# Patient Record
Sex: Male | Born: 1960 | Race: White | Hispanic: No | Smoking: Never smoker
Health system: Southern US, Community
[De-identification: ages and names within clinical notes are randomized; demographics above are authoritative.]

## PROBLEM LIST (undated history)

## (undated) DIAGNOSIS — E669 Obesity, unspecified: Secondary | ICD-10-CM

## (undated) DIAGNOSIS — M5030 Other cervical disc degeneration, unspecified cervical region: Secondary | ICD-10-CM

## (undated) DIAGNOSIS — R7303 Prediabetes: Secondary | ICD-10-CM

## (undated) DIAGNOSIS — K219 Gastro-esophageal reflux disease without esophagitis: Secondary | ICD-10-CM

## (undated) DIAGNOSIS — G4730 Sleep apnea, unspecified: Secondary | ICD-10-CM

## (undated) DIAGNOSIS — M1990 Unspecified osteoarthritis, unspecified site: Secondary | ICD-10-CM

## (undated) DIAGNOSIS — I700 Atherosclerosis of aorta: Secondary | ICD-10-CM

## (undated) DIAGNOSIS — R7989 Other specified abnormal findings of blood chemistry: Secondary | ICD-10-CM

## (undated) DIAGNOSIS — M109 Gout, unspecified: Secondary | ICD-10-CM

## (undated) DIAGNOSIS — I10 Essential (primary) hypertension: Secondary | ICD-10-CM

## (undated) DIAGNOSIS — L570 Actinic keratosis: Secondary | ICD-10-CM

## (undated) DIAGNOSIS — E119 Type 2 diabetes mellitus without complications: Secondary | ICD-10-CM

## (undated) HISTORY — DX: Essential (primary) hypertension: I10

## (undated) HISTORY — DX: Actinic keratosis: L57.0

## (undated) HISTORY — PX: SPINE SURGERY: SHX786

## (undated) HISTORY — PX: BACK SURGERY: SHX140

## (undated) HISTORY — PX: PROSTATE SURGERY: SHX751

## (undated) HISTORY — PX: ELBOW SURGERY: SHX618

## (undated) HISTORY — DX: Obesity, unspecified: E66.9

---

## 1997-04-02 DIAGNOSIS — M7710 Lateral epicondylitis, unspecified elbow: Secondary | ICD-10-CM | POA: Insufficient documentation

## 2006-03-04 ENCOUNTER — Ambulatory Visit: Payer: Self-pay | Admitting: Specialist

## 2006-03-19 ENCOUNTER — Ambulatory Visit (HOSPITAL_COMMUNITY): Admission: RE | Admit: 2006-03-19 | Discharge: 2006-03-20 | Payer: Self-pay | Admitting: Neurosurgery

## 2006-11-27 ENCOUNTER — Ambulatory Visit: Payer: Self-pay | Admitting: Neurosurgery

## 2007-01-07 ENCOUNTER — Inpatient Hospital Stay (HOSPITAL_COMMUNITY): Admission: RE | Admit: 2007-01-07 | Discharge: 2007-01-09 | Payer: Self-pay | Admitting: Neurosurgery

## 2007-02-11 ENCOUNTER — Encounter: Admission: RE | Admit: 2007-02-11 | Discharge: 2007-02-11 | Payer: Self-pay | Admitting: Neurosurgery

## 2008-12-31 ENCOUNTER — Encounter: Admission: RE | Admit: 2008-12-31 | Discharge: 2008-12-31 | Payer: Self-pay | Admitting: Neurosurgery

## 2009-11-10 ENCOUNTER — Ambulatory Visit: Payer: Self-pay | Admitting: Gastroenterology

## 2010-06-06 NOTE — Op Note (Signed)
NAMEKLAUS, Mike Patel                 ACCOUNT NO.:  [id number]   MEDICAL RECORD NO.:  [id number]          PATIENT TYPE:  INP   LOCATION:  2899                         FACILITY:  [facility]   PHYSICIAN:  Sherilyn Cooter A. Pool, M.D.    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   PROCEDURE:  L4-5 redo laminectomy with resection of synovial cyst.  L4-5  posterior lumbar interbody fusion utilizing Tangent interbody allograft  wedge, total interbody PEEK cage, and local autografting.  L4-5  posterolateral arthrodesis utilizing nonsegmental pedicle screw fixation  and local autografting.   SURGEON:  Kathaleen Maser. Pool, M.D.   ASSISTANT:  Reinaldo Meeker, M.D.   ANESTHESIA:  General.   INDICATIONS FOR PROCEDURE:  Mike Patel is a 50 year old male who is status  post previous left-sided L4-5 laminotomy and microdiskectomy who  presents now with worsening back and left lower extremity pain.  Workup  demonstrates evidence of the new onset of enlarged left-sided L4-5  synovial cyst causing compression of the thecal sac and the left-sided  L5 nerve root.  There is evidence of dynamic instability at the L4-5  level.  The patient does have disk degeneration at L5-S1, but this is  not symptomatic at this time.  We discussed the options of management.  We decided to proceed with L4-5 decompression and fusion.   DESCRIPTION OF PROCEDURE:  The patient was brought to the operating room  and placed on the operating table in the supine position.  After  adequate anesthesia was achieved, the patient was turned prone onto the  Wilson frame appropriately padded.  The patient's lumbar region was  prepped and draped sterilely.   A 10 blade was used to make a linear skin  incision overlying the L4-5  interspace.  This was carried down sharply in the midline.  Subperiosteal dissection then performed, exposing the lamina and facet  joints of L4 and L5 as well as the transverse processes of L4 and L5.  Deep self-retaining retractors were placed. Intraoperative fluoroscopy  was used.  Levels were confirmed.  Previous laminotomy on the left-side  at L4-5 was dissected free using dental instruments.  Complete  laminectomy of L4 was then performed using Leksell rongeurs, Kerrison  rongeurs, and high-speed drill.  All elements of the lamina were  completely resected.  All elements of the inferior facets of L4 were  resected bilaterally.  Superior facetectomies at L5 were performed  bilaterally, and superior laminectomy at L5 was performed bilaterally.  All bone was cleaned and used in later autograft.  The ligament flavum  and epidural scar was then dissected free and resected.  Underlying  thecal sac and exiting L4 and L5 nerve roots were identified.  The  synovial cyst on the left side was gently dissected free using  microdissection and completely resected.  Epidural venous plexus  coagulated and cut.  Starting first on the patient's right side, the  thecal sac and nerve roots gently mobilized and tracked towards the  midline.  Disk space incised with 15 blade in rectangular fracture.  Wide disk space clean out was achieved using pituitary rongeurs,  upbiting pituitary rongeurs, and Epstein curettes.  All elements of the  disk herniation and central disk were removed.  Procedure was then  repeated on the contralateral side.  A 12-mm distractor was left in the  patient's right side.  The thecal sac and nerve roots were protected on  the left side.  Disk space was then reamed and cut with 12-mm Tangent  instrument.  Soft tissue was removed from the  interspace.  A 12 x 26-mm  Tangent wedge was then impacted into place and recessed approximately 3  mm into the  posterior cortical margin of L4.  Distractors were moved to  the patient's right side.  The thecal sac and nerve roots were protected  on the right side.  Disk space once again reamed and then cut with 12-mm  Tangent instrument.  Soft tissues removed from the interspace.  The disk  space was further curettaged.  Morselized autograft was then packed into  the interspace.  A 12 x 26-mm Telamon cage was then impacted into place  and recessed approximately 3 mm into the posterior cortical margin.  Pedicles at L4-L5 were then identified using surface landmarks and  intraoperative fluoroscopy.  Superficial bone around the pedicle was  then removed using the high-speed drill.  Each pedicle was then probed  using pedicle awl.  Pedicle awl track was then tapped with the 5.25  screw tapper.  Each screw tap hole was probed and found be solid within  bone.  6.75 x 50-mm Radius screws were placed bilaterally at L4, 6.75 x  45-mm screws placed bilaterally at L5.  Transverse processes of L4 and  L5 were then decorticated using high-speed drill.  Morselized autograft  was packed posterolaterally for later fusion.  Short segment titanium  rod was then placed over the screw heads at L4-L5.  Locking caps then  placed over the titanium rod.  Each locking cap was then given a final  tightening with the construct under compression.  Final images revealed  good position of bone grafts, hardware at proper level at lumbar spine.  The transverse connector was placed.  The wound was then irrigated one  final time.  Gelfoam was placed topically.  Hemostasis found to be good.  A medium Hemovac drain was placed in the epidural space.  The wound was  then closed in layers with Vicryl suture.  Steri-Strips and sterile  dressing were applied.  There were no complications.   The patient tolerated the procedure well and returned to the recovery  room postoperatively.           ______________________________  Kathaleen Maser  Pool, M.D.     HAP/MEDQ  D:  01/07/2007  T:  01/07/2007  Job:  [id number]

## 2010-06-09 NOTE — Op Note (Signed)
NAMEIKECHUKWU, CERNY                 ACCOUNT NO.:  [id number]   MEDICAL RECORD NO.:  [id number]          PATIENT TYPE:  AMB   LOCATION:  SDS                          FACILITY:  [facility]   PHYSICIAN:  Henry A. Pool, M.D.    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   PROCEDURE NOTE:  Left L4-5 laminotomy and microdiskectomy.   SURGEON:  Kathaleen Maser. Pool, M.D.   ASSISTANT:  Reinaldo Meeker, M.D.   ANESTHESIA:  General orotracheal anesthesia.   INDICATIONS FOR PROCEDURE:  Mr. Manning is a 50 year old male with history  of severe left lower extremity pain, paresthesias and weakness  consistent with a  left-sided L5 radiculopathy failing conservative  management.  Workup demonstrates evidence of left-sided L4-5 spondylosis  with an associated disk herniation causing compression of the left-sided  L5 nerve root.  The patient has been counseled as to his options.  He  has decided to proceed with a left-sided L4-5 laminotomy microdiskectomy  in hopes of improving his symptoms.   DESCRIPTION OF PROCEDURE:  The patient taken to the operating room and  placed on the table in supine position.  After adequate level of  anesthesia was achieved, the patient positioned prone onto Wilson frame,  appropriately padded.  Patient's lumbar region was prepped and draped  sterilely.  A 10 blade was used to make a linear skin incision overlying  the L4-5 interspace.  This was carried down sharply in the midline.  Subperiosteal dissection was performed exposing the lamina of facet  joints at L4 and L5 on the left side.  Deep self-retaining retractor was  placed.  Intraoperative x-ray was taken and levels were confirmed.  Laminotomy was then performed using  high-speed drill and Kerrison  rongeurs.  We removed the inferior aspect of the lamina of L4, medial  aspect of the L4-5 facet joint, superior rim of the L5 lamina.  Ligament  flavum was then elevated and resected in piecemeal fashion using  Kerrison rongeurs.  Underlying thecal sac and exiting L5 nerve root were  identified.  Microscope was brought into the field and used for  microdissection of left-sided L5 nerve root, underlying disk herniation.  Epidural venous plexus coagulated and cut.  Thecal sac and L5 nerve root  were gently mobilized towards the midline and held in place with the  D'Errico repair nerve root retractor.  Disk herniation was readily  apparent.  This was then incised with a 15 blade in a rectangular  fashion entering the annulus as well.  All elements of disk herniation  were completely resected.  All loose or obviously degenerative disc  material from within the interspace was also resected.  At this point, a  very thorough decompression had been achieved.  There was no injury to  the thecal sac or nerve roots.  Wounds were then irrigated with  antibiotic solution.  Gelfoam was placed topically for hemostasis which  was found to be good.  Microscope and retractor system were removed.  Hemostasis was achieved with electrothermal.  Wounds were closed in layers with Vicryl suture.  Steri-Strips and  sterile dressings were applied.  There were no apparent complications.  The patient tolerated the procedure well and he returns to the recovery  room postoperatively.           ______________________________  Kathaleen Maser Pool, M.D.     HAP/MEDQ  D:  03/19/2006  T:  03/19/2006  Job:  [id number]

## 2010-10-30 LAB — CBC
HCT: 47.1
Hemoglobin: 15.9
MCHC: 33.8
MCV: 88.6
Platelets: 356
RBC: 5.32
RDW: 13.1
WBC: 13.9 — ABNORMAL HIGH

## 2010-10-30 LAB — DIFFERENTIAL
Basophils Absolute: 0
Basophils Relative: 0
Eosinophils Absolute: 0 — ABNORMAL LOW
Eosinophils Relative: 0
Lymphocytes Relative: 15
Lymphs Abs: 2.1
Monocytes Absolute: 0.3
Monocytes Relative: 2 — ABNORMAL LOW
Neutro Abs: 11.5 — ABNORMAL HIGH
Neutrophils Relative %: 83 — ABNORMAL HIGH

## 2010-10-30 LAB — BASIC METABOLIC PANEL
BUN: 12
CO2: 28
Calcium: 9.8
Chloride: 100
Creatinine, Ser: 0.84
GFR calc Af Amer: >60
GFR calc non Af Amer: >60
Glucose, Bld: 123 — ABNORMAL HIGH
Potassium: 4.5
Sodium: 140

## 2010-10-30 LAB — TYPE AND SCREEN
ABO/RH(D): O POS
Antibody Screen: NEGATIVE

## 2011-07-22 ENCOUNTER — Ambulatory Visit (INDEPENDENT_AMBULATORY_CARE_PROVIDER_SITE_OTHER): Payer: BC Managed Care – PPO | Admitting: Physician Assistant

## 2011-07-22 VITALS — BP 146/94 | HR 97 | Temp 98.2°F | Resp 20 | Ht 70.0 in | Wt 350.2 lb

## 2011-07-22 DIAGNOSIS — J019 Acute sinusitis, unspecified: Secondary | ICD-10-CM

## 2011-07-22 MED ORDER — IPRATROPIUM BROMIDE 0.03 % NA SOLN: 2.0000 | Freq: Two times a day (BID) | NASAL | Status: DC

## 2011-07-22 MED ORDER — AMOXICILLIN 875 MG PO TABS: 1750.0000 mg | ORAL_TABLET | Freq: Two times a day (BID) | ORAL | Status: AC

## 2011-07-22 MED ORDER — GUAIFENESIN ER 1200 MG PO TB12: 1.0000 | ORAL_TABLET | Freq: Two times a day (BID) | ORAL | Status: DC | PRN

## 2011-07-22 NOTE — Progress Notes (Signed)
  Subjective:    Patient ID: Mike Patel, male    DOB: Sep 26, 1960, 51 y.o.   MRN: [id number]  HPI Illness x 3 days. Facial pressure, pain, "my teeth hurt," drainage.  Mucinex cold and sinus without benefit. Fever/chills yesterday, T 99.  No GI/GU symptoms.  Unable to work last night.  Review of Systems As above.    Objective:   Physical Exam  Vitals reviewed. Constitutional: He is oriented to person, place, and time. Vital signs are normal. He appears well-developed and well-nourished. No distress.  HENT:  Head: Normocephalic and atraumatic.  Right Ear: Hearing, tympanic membrane, external ear and ear canal normal.  Left Ear: Hearing, tympanic membrane, external ear and ear canal normal.  Nose: Mucosal edema and rhinorrhea present.  No foreign bodies. Right sinus exhibits maxillary sinus tenderness. Right sinus exhibits no frontal sinus tenderness. Left sinus exhibits maxillary sinus tenderness. Left sinus exhibits no frontal sinus tenderness.  Mouth/Throat: Uvula is midline, oropharynx is clear and moist and mucous membranes are normal. No uvula swelling. No oropharyngeal exudate.  Eyes: Conjunctivae and EOM are normal. Pupils are equal, round, and reactive to light. Right eye exhibits no discharge. Left eye exhibits no discharge. No scleral icterus.  Neck: Trachea normal, normal range of motion and full passive range of motion without pain. Neck supple. No mass and no thyromegaly present.  Cardiovascular: Normal rate, regular rhythm and normal heart sounds.   Pulmonary/Chest: Effort normal and breath sounds normal.  Lymphadenopathy:       Head (right side): No submandibular, no tonsillar, no preauricular, no posterior auricular and no occipital adenopathy present.       Head (left side): No submandibular, no tonsillar, no preauricular and no occipital adenopathy present.    He has no cervical adenopathy.       Right: No supraclavicular adenopathy present.       Left: No  supraclavicular adenopathy present.  Neurological: He is alert and oriented to person, place, and time. He has normal strength. No cranial nerve deficit or sensory deficit.  Skin: Skin is warm, dry and intact. No rash noted.  Psychiatric: He has a normal mood and affect. His speech is normal and behavior is normal.          Assessment & Plan:   1. Acute sinusitis, unspecified  ipratropium (ATROVENT) 0.03 % nasal spray, Guaifenesin (MUCINEX MAXIMUM STRENGTH) 1200 MG TB12, amoxicillin (AMOXIL) 875 MG tablet   Patient Instructions  Stop the Mucinex Cold and Sinus, as it contains a decongestant which can be drying, and can raise your blood pressure.  Instead, use the Mucinex Maximum Strength twice daily.  Be sure to drink at least 64 ounces of water daily, as the Mucinex can't work properly if you do not consume enough liquids.  Get plenty of rest.

## 2011-07-22 NOTE — Patient Instructions (Signed)
Stop the Mucinex Cold and Sinus, as it contains a decongestant which can be drying, and can raise your blood pressure.  Instead, use the Mucinex Maximum Strength twice daily.  Be sure to drink at least 64 ounces of water daily, as the Mucinex can't work properly if you do not consume enough liquids.  Get plenty of rest.

## 2012-01-05 ENCOUNTER — Other Ambulatory Visit: Payer: Self-pay | Admitting: Physician Assistant

## 2012-01-28 ENCOUNTER — Ambulatory Visit (INDEPENDENT_AMBULATORY_CARE_PROVIDER_SITE_OTHER): Payer: BC Managed Care – PPO | Admitting: Family Medicine

## 2012-01-28 VITALS — BP 150/101 | HR 82 | Temp 98.3°F | Resp 18 | Ht 70.0 in | Wt 360.0 lb

## 2012-01-28 DIAGNOSIS — R6884 Jaw pain: Secondary | ICD-10-CM

## 2012-01-28 DIAGNOSIS — E669 Obesity, unspecified: Secondary | ICD-10-CM

## 2012-01-28 DIAGNOSIS — I10 Essential (primary) hypertension: Secondary | ICD-10-CM

## 2012-01-28 MED ORDER — AMOXICILLIN 500 MG PO CAPS: 1000.0000 mg | ORAL_CAPSULE | Freq: Two times a day (BID) | ORAL | Status: DC

## 2012-01-28 NOTE — Progress Notes (Signed)
[facility] [address], [place] [address] [phone]  Date:  01/28/2012   Name:  Mike Patel   DOB:  1960/12/15   MRN:  [id number]  PCP:  No primary provider on file.    Chief Complaint: Sinusitis, Otalgia and Jaw Pain   History of Present Illness:  Mike Patel is a 52 y.o. very pleasant male patient who presents with the following:  He has noted tenderness in his left neck,  jaw and teeth, and left ear for the last 3 days.  It seemed a little worse yesterday.  He has tried some mucinex but it has not helped, also ear drops.    He is a [employer] and has been up since yesterday.   The pain seems worse at rest, not with activity  He does not have a cough, but does feel a bit achy.  No chills or fever.  No ST.   He denies any CP or SOB.  He has never had a stress test or other cardiac evaluation  There is no problem list on file for this patient.   Past Medical History  Diagnosis Date  . Hypertension   . Obesity     Past Surgical History  Procedure Date  . Spine surgery     l-spine x 2  . Elbow surgery     tennis elbow    History  Substance Use Topics  . Smoking status: Never Smoker   . Smokeless tobacco: Not on file  . Alcohol Use: No    No family history on file.  No Known Allergies  Medication list has been reviewed and updated.  Current Outpatient Prescriptions on File Prior to Visit  Medication Sig Dispense Refill  . amLODipine (NORVASC) 10 MG tablet Take 10 mg by mouth daily.      Marland Kitchen aspirin 81 MG tablet Take 81 mg by mouth daily.      . benazepril (LOTENSIN) 20 MG tablet Take 20 mg by mouth daily.      . fish oil-omega-3 fatty acids 1000 MG capsule Take 2 g by mouth daily.      Marland Kitchen ipratropium (ATROVENT) 0.03 % nasal spray PLACE 2 SPRAYS INTO THE NOSTRIL TWICE A DAY  30 mL  0  . lansoprazole (PREVACID) 15 MG capsule Take 15 mg by mouth daily.      . Guaifenesin (MUCINEX MAXIMUM STRENGTH) 1200 MG TB12 Take 1 tablet  (1,200 mg total) by mouth every 12 (twelve) hours as needed.  14 tablet  1    Review of Systems:  As per HPI- otherwise negative.   Physical Examination: Filed Vitals:   01/28/12 0800  BP: 150/101  Pulse: 82  Temp: 98.3 F (36.8 C)  Resp: 18   Filed Vitals:   01/28/12 0800  Height: 5\' 10"  (1.778 m)  Weight: 360 lb (163.295 kg)   Body mass index is 51.65 kg/(m^2). Ideal Body Weight: Weight in (lb) to have BMI = 25: 173.9   GEN: WDWN, NAD, Non-toxic, A & O x 3, morbid obesity HEENT: Atraumatic, Normocephalic. Neck supple. No masses, No LAD. Bilateral TM wnl, oropharynx normal.  PEERL,EOMI.  No tooth tenderness or sign of dental infection or abscess.  No apparent cervical LAD, but he does note mild tenderness with pressure over right anterior cervical chain.  No meningismus, no bruit Ears and Nose: No external deformity. CV: RRR, No M/G/R. No JVD. No thrill. No extra heart sounds. PULM: CTA B,  no wheezes, crackles, rhonchi. No retractions. No resp. distress. No accessory muscle use. EXTR: No c/c/e NEURO Normal gait.  PSYCH: Normally interactive. Conversant. Not depressed or anxious appearing.  Calm demeanor.   Discussed my concern that he has multiple risk factors for CAD/ MI including obesity and HTN. Left jaw and neck pain can be a sign of cardiac ischemia.  However, he refused to let me perform an EKG today.  He stated that he was only interested in getting antibiotics and would just leave rather than allow me to perform an EKG or any other evaluation today.  "I know it's not my heart, I'm not into that (having an EKG)- if you won't give me some medication I'll just leave."   Assessment and Plan: 1. HTN (hypertension)    2. Obesity    3. Jaw pain  amoxicillin (AMOXIL) 500 MG capsule   Mike Patel is here with left neck and jaw pain today which he feels certain is due to some sort of infection.  Refused to allow me to investigate possible cardiac ischemia with an EKG despite my  explanations and urging.  Prescribed amoxicillin for him as he requested.  Did encourage him to let someone know if he does not feel better, and to have a stress test in any case due to his multiple risk factors including morbid obesity, uncontrolled HTN, sedentary lifestyle and I suspect high cholesterol.   Abbe Amsterdam, MD

## 2012-01-28 NOTE — Patient Instructions (Addendum)
If you continue to have jaw pain please let us or your regular doctor know. You have multiple risk factors for heart disease and heart attack and should consider having a stress test to look for early signs of a cardiac blockage.

## 2012-02-18 DIAGNOSIS — N529 Male erectile dysfunction, unspecified: Secondary | ICD-10-CM | POA: Insufficient documentation

## 2012-02-18 DIAGNOSIS — E291 Testicular hypofunction: Secondary | ICD-10-CM | POA: Insufficient documentation

## 2012-02-18 DIAGNOSIS — N4340 Spermatocele of epididymis, unspecified: Secondary | ICD-10-CM | POA: Insufficient documentation

## 2012-02-18 DIAGNOSIS — N401 Enlarged prostate with lower urinary tract symptoms: Secondary | ICD-10-CM | POA: Insufficient documentation

## 2012-02-19 ENCOUNTER — Other Ambulatory Visit: Payer: Self-pay | Admitting: Physician Assistant

## 2013-01-22 HISTORY — PX: PROSTATE SURGERY: SHX751

## 2013-06-01 DIAGNOSIS — R339 Retention of urine, unspecified: Secondary | ICD-10-CM | POA: Insufficient documentation

## 2013-09-01 ENCOUNTER — Encounter: Payer: Self-pay | Admitting: Physician Assistant

## 2013-09-01 DIAGNOSIS — G4733 Obstructive sleep apnea (adult) (pediatric): Secondary | ICD-10-CM | POA: Insufficient documentation

## 2013-09-01 DIAGNOSIS — Z9989 Dependence on other enabling machines and devices: Secondary | ICD-10-CM

## 2013-09-21 DIAGNOSIS — R399 Unspecified symptoms and signs involving the genitourinary system: Secondary | ICD-10-CM

## 2013-09-21 DIAGNOSIS — N429 Disorder of prostate, unspecified: Secondary | ICD-10-CM | POA: Insufficient documentation

## 2013-09-29 DIAGNOSIS — N411 Chronic prostatitis: Secondary | ICD-10-CM | POA: Insufficient documentation

## 2014-05-10 ENCOUNTER — Ambulatory Visit
Admit: 2014-05-10 | Disposition: A | Discharge status: Home / Self Care | Payer: Self-pay | Attending: Adult Health | Admitting: Adult Health

## 2014-05-24 ENCOUNTER — Ambulatory Visit
Admission: RE | Admit: 2014-05-24 | Discharge: 2014-05-24 | Disposition: A | Discharge status: Home / Self Care | Payer: BLUE CROSS/BLUE SHIELD | Source: Ambulatory Visit | Attending: Gastroenterology | Admitting: Gastroenterology

## 2014-05-24 ENCOUNTER — Encounter
Admission: RE | Disposition: A | Discharge status: Home / Self Care | Payer: Self-pay | Source: Ambulatory Visit | Attending: Gastroenterology

## 2014-05-24 ENCOUNTER — Ambulatory Visit: Payer: BLUE CROSS/BLUE SHIELD | Admitting: Anesthesiology

## 2014-05-24 ENCOUNTER — Encounter: Payer: Self-pay | Admitting: *Deleted

## 2014-05-24 DIAGNOSIS — I159 Secondary hypertension, unspecified: Secondary | ICD-10-CM

## 2014-05-24 DIAGNOSIS — E669 Obesity, unspecified: Secondary | ICD-10-CM

## 2014-05-24 DIAGNOSIS — Z86010 Personal history of colonic polyps: Secondary | ICD-10-CM | POA: Insufficient documentation

## 2014-05-24 DIAGNOSIS — K219 Gastro-esophageal reflux disease without esophagitis: Secondary | ICD-10-CM | POA: Insufficient documentation

## 2014-05-24 DIAGNOSIS — R194 Change in bowel habit: Secondary | ICD-10-CM | POA: Diagnosis present

## 2014-05-24 DIAGNOSIS — R1013 Epigastric pain: Secondary | ICD-10-CM | POA: Diagnosis not present

## 2014-05-24 DIAGNOSIS — A088 Other specified intestinal infections: Secondary | ICD-10-CM | POA: Insufficient documentation

## 2014-05-24 DIAGNOSIS — Z79899 Other long term (current) drug therapy: Secondary | ICD-10-CM | POA: Diagnosis not present

## 2014-05-24 DIAGNOSIS — G4733 Obstructive sleep apnea (adult) (pediatric): Secondary | ICD-10-CM

## 2014-05-24 DIAGNOSIS — R110 Nausea: Secondary | ICD-10-CM | POA: Insufficient documentation

## 2014-05-24 DIAGNOSIS — Z9989 Dependence on other enabling machines and devices: Secondary | ICD-10-CM

## 2014-05-24 HISTORY — DX: Sleep apnea, unspecified: G47.30

## 2014-05-24 HISTORY — PX: COLONOSCOPY: SHX5424

## 2014-05-24 HISTORY — PX: ESOPHAGOGASTRODUODENOSCOPY: SHX5428

## 2014-05-24 SURGERY — COLONOSCOPY
Anesthesia: General

## 2014-05-24 MED ORDER — ONDANSETRON HCL 4 MG/2ML IJ SOLN
INTRAMUSCULAR | Status: DC | PRN
Start: 2014-05-24 — End: 2014-05-24
  Administered 2014-05-24: 4 mg via INTRAVENOUS

## 2014-05-24 MED ORDER — LIDOCAINE HCL (PF) 2 % IJ SOLN
INTRAMUSCULAR | Status: DC | PRN
  Administered 2014-05-24: 60 mg via INTRADERMAL

## 2014-05-24 MED ORDER — SODIUM CHLORIDE 0.9 % IV SOLN
INTRAVENOUS | Status: DC
  Administered 2014-05-24: 10:00:00 via INTRAVENOUS
  Administered 2014-05-24: 1000 mL via INTRAVENOUS

## 2014-05-24 MED ORDER — FENTANYL CITRATE (PF) 100 MCG/2ML IJ SOLN
INTRAMUSCULAR | Status: DC | PRN
  Administered 2014-05-24: 50 ug via INTRAVENOUS

## 2014-05-24 MED ORDER — GLYCOPYRROLATE 0.2 MG/ML IJ SOLN
INTRAMUSCULAR | Status: DC | PRN
Start: 2014-05-24 — End: 2014-05-24
  Administered 2014-05-24: 0.2 mg via INTRAVENOUS

## 2014-05-24 MED ORDER — PHENYLEPHRINE HCL 10 MG/ML IJ SOLN
INTRAMUSCULAR | Status: DC | PRN
  Administered 2014-05-24: 200 ug via INTRAVENOUS

## 2014-05-24 MED ORDER — MIDAZOLAM HCL 2 MG/2ML IJ SOLN
INTRAMUSCULAR | Status: DC | PRN
  Administered 2014-05-24: 1 mg via INTRAVENOUS

## 2014-05-24 MED ORDER — PROPOFOL INFUSION 10 MG/ML OPTIME
INTRAVENOUS | Status: DC | PRN
  Administered 2014-05-24: 150 ug/kg/min via INTRAVENOUS

## 2014-05-24 MED ORDER — PROPOFOL 10 MG/ML IV BOLUS
INTRAVENOUS | Status: DC | PRN
  Administered 2014-05-24: 20 mg via INTRAVENOUS
  Administered 2014-05-24: 30 mg via INTRAVENOUS

## 2014-05-24 NOTE — Anesthesia Postprocedure Evaluation (Signed)
  Anesthesia Post-op Note  Patient: Mike Patel  Procedure(s) Performed: Procedure(s): COLONOSCOPY (N/A) ESOPHAGOGASTRODUODENOSCOPY (EGD) (N/A)  Anesthesia type:General  Patient location: PACU  Post pain: Pain level controlled  Post assessment: Post-op Vital signs reviewed, Patient's Cardiovascular Status Stable, Respiratory Function Stable, Patent Airway and No signs of Nausea or vomiting  Post vital signs: Reviewed and stable  Last Vitals:  Filed Vitals:   05/24/14 1135  BP: 113/61  Pulse: 72  Temp:   Resp: 15    Level of consciousness: awake, alert  and patient cooperative  Complications: No apparent anesthesia complications

## 2014-05-24 NOTE — Transfer of Care (Signed)
Immediate Anesthesia Transfer of Care Note  Patient: Mike Patel  Procedure(s) Performed: Procedure(s): COLONOSCOPY (N/A) ESOPHAGOGASTRODUODENOSCOPY (EGD) (N/A)  Patient Location: PACU  Anesthesia Type:MAC  Level of Consciousness: awake  Airway & Oxygen Therapy: Patient Spontanous Breathing  Post-op Assessment: Report given to RN  Post vital signs: stable  Last Vitals:  Filed Vitals:   05/24/14 1001  BP: 122/76  Pulse: 74  Temp: 37.1 C  Resp: 19    Complications: No apparent anesthesia complications

## 2014-05-24 NOTE — Anesthesia Preprocedure Evaluation (Addendum)
Anesthesia Evaluation  Patient identified by MRN, date of birth, ID band Patient awake    Reviewed: Allergy & Precautions, H&P , NPO status , Patient's Chart, lab work & pertinent test results  History of Anesthesia Complications Negative for: history of anesthetic complications  Airway Mallampati: III  TM Distance: >3 FB Neck ROM: full    Dental   Pulmonary sleep apnea and Continuous Positive Airway Pressure Ventilation ,          Cardiovascular hypertension, On Medications and On Home Beta Blockers     Neuro/Psych    GI/Hepatic GERD-  Medicated and Controlled,  Endo/Other    Renal/GU      Musculoskeletal   Abdominal   Peds  Hematology   Anesthesia Other Findings   Reproductive/Obstetrics                            Anesthesia Physical Anesthesia Plan  ASA: III  Anesthesia Plan: General   Post-op Pain Management:    Induction:   Airway Management Planned:   Additional Equipment:   Intra-op Plan:   Post-operative Plan:   Informed Consent: I have reviewed the patients History and Physical, chart, labs and discussed the procedure including the risks, benefits and alternatives for the proposed anesthesia with the patient or authorized representative who has indicated his/her understanding and acceptance.     Plan Discussed with: CRNA  Anesthesia Plan Comments:         Anesthesia Quick Evaluation

## 2014-05-24 NOTE — Op Note (Signed)
[facility] Gastroenterology Patient Name: Mike Patel Procedure Date: 05/24/2014 10:10 AM MRN: [id number] Account #: [id number] Date of Birth: 1960-07-05 Admit Type: Outpatient Age: 54 Room: [facility] ENDO ROOM 4 Gender: Male Note Status: Finalized Procedure:         Upper GI endoscopy Indications:       Suspected esophageal reflux, Nausea,                    Symptoms almost resolved. Providers:         Lupita Dawn. Candace Cruise, MD Referring MD:      Margaretha Glassing, MD (Referring MD) Medicines:         Monitored Anesthesia Care Complications:     No immediate complications. Procedure:         Pre-Anesthesia Assessment:                    - Prior to the procedure, a History and Physical was                     performed, and patient medications, allergies and                     sensitivities were reviewed. The patient's tolerance of                     previous anesthesia was reviewed.                    - The risks and benefits of the procedure and the sedation                     options and risks were discussed with the patient. All                     questions were answered and informed consent was obtained.                    - After reviewing the risks and benefits, the patient was                     deemed in satisfactory condition to undergo the procedure.                    After obtaining informed consent, the endoscope was passed                     under direct vision. Throughout the procedure, the                     patient's blood pressure, pulse, and oxygen saturations                     were monitored continuously. The Endoscope was introduced                     through the mouth, and advanced to the second part of                     duodenum. The upper GI endoscopy was accomplished without                     difficulty. The patient tolerated the procedure well. Findings:      The examined esophagus was normal. Biopsies were taken with a cold  forceps for histology.      The entire examined stomach was normal.      The examined duodenum was normal. Impression:        - Normal esophagus. Biopsied.                    - Normal stomach.                    - Normal examined duodenum. Recommendation:    - Discharge patient to home.                    - Observe patient's clinical course.                    - Continue present medications.                    - Await pathology results.                    - The findings and recommendations were discussed with the                     patient. Procedure Code(s): --- Professional ---                    [phone], Esophagogastroduodenoscopy, flexible, transoral;                     with biopsy, single or multiple Diagnosis Code(s): --- Professional ---                    R11.0, Nausea CPT copyright 2014 [employer]. All rights reserved. The codes documented in this report are preliminary and upon coder review may  be revised to meet current compliance requirements. Hulen Luster, MD 05/24/2014 10:42:57 AM This report has been signed electronically. Number of Addenda: 0 Note Initiated On: 05/24/2014 10:10 AM      [facility]

## 2014-05-24 NOTE — H&P (Signed)
  Date of Initial H&P: 05/18/2014  History reviewed, patient examined, no change in status, stable for surgery.

## 2014-05-24 NOTE — Op Note (Signed)
[facility] Gastroenterology Patient Name: Mike Patel Procedure Date: 05/24/2014 10:09 AM MRN: [id number] Account #: [id number] Date of Birth: [birth year] Admit Type: Outpatient Age: 54 Room: [facility] ENDO ROOM 4 Gender: Male Note Status: Finalized Procedure:         Colonoscopy Indications:       Personal history of colonic polyps, Change in bowel habits Providers:         Lupita Dawn. Candace Cruise, MD Referring MD:      Irven Easterly. Kary Kos, MD (Referring MD) Medicines:         Monitored Anesthesia Care Complications:     No immediate complications. Procedure:         Pre-Anesthesia Assessment:                    - Prior to the procedure, a History and Physical was                     performed, and patient medications, allergies and                     sensitivities were reviewed. The patient's tolerance of                     previous anesthesia was reviewed.                    - The risks and benefits of the procedure and the sedation                     options and risks were discussed with the patient. All                     questions were answered and informed consent was obtained.                    - After reviewing the risks and benefits, the patient was                     deemed in satisfactory condition to undergo the procedure.                    After obtaining informed consent, the colonoscope was                     passed under direct vision. Throughout the procedure, the                     patient's blood pressure, pulse, and oxygen saturations                     were monitored continuously. The Olympus CF-H180AL                     colonoscope ( S#: [id number] ) was introduced through the                     anus and advanced to the the cecum, identified by                     appendiceal orifice and ileocecal valve. The colonoscopy                     was performed without difficulty. The patient tolerated  the procedure well. The quality of the  bowel preparation                     was good. Findings:      The colon (entire examined portion) appeared normal. Biopsies for       histology were taken with a cold forceps from the entire colon for       evaluation of microscopic colitis. Impression:        - The entire examined colon is normal. Biopsied. Recommendation:    - Discharge patient to home.                    - Await pathology results.                    - Repeat colonoscopy in 5 years for surveillance.                    - The findings and recommendations were discussed with the                     patient.                    - Continue present medications. Procedure Code(s): --- Professional ---                    [phone], Colonoscopy, flexible; with biopsy, single or                     multiple Diagnosis Code(s): --- Professional ---                    Z86.010, Personal history of colonic polyps                    R19.4, Change in bowel habit CPT copyright 2014 American Medical Association. All rights reserved. The codes documented in this report are preliminary and upon coder review may  be revised to meet current compliance requirements. Hulen Luster, MD 05/24/2014 10:53:25 AM This report has been signed electronically. Number of Addenda: 0 Note Initiated On: 05/24/2014 10:09 AM Scope Withdrawal Time: 0 hours 4 minutes 3 seconds  Total Procedure Duration: 0 hours 6 minutes 20 seconds       [facility]

## 2014-05-27 ENCOUNTER — Encounter: Payer: Self-pay | Admitting: Gastroenterology

## 2014-05-27 LAB — SURGICAL PATHOLOGY

## 2014-07-28 ENCOUNTER — Other Ambulatory Visit: Payer: Self-pay | Admitting: Neurosurgery

## 2014-07-28 DIAGNOSIS — M5136 Other intervertebral disc degeneration, lumbar region: Secondary | ICD-10-CM

## 2014-08-03 ENCOUNTER — Ambulatory Visit: Admission: RE | Admit: 2014-08-03 | Payer: BLUE CROSS/BLUE SHIELD | Source: Ambulatory Visit

## 2014-08-05 ENCOUNTER — Ambulatory Visit: Payer: BLUE CROSS/BLUE SHIELD

## 2014-08-05 ENCOUNTER — Ambulatory Visit
Admission: RE | Admit: 2014-08-05 | Discharge: 2014-08-05 | Disposition: A | Discharge status: Home / Self Care | Payer: BLUE CROSS/BLUE SHIELD | Source: Ambulatory Visit | Attending: Neurosurgery | Admitting: Neurosurgery

## 2014-08-05 ENCOUNTER — Other Ambulatory Visit
Admission: RE | Admit: 2014-08-05 | Discharge: 2014-08-05 | Disposition: A | Discharge status: Home / Self Care | Payer: BLUE CROSS/BLUE SHIELD | Source: Ambulatory Visit | Attending: Neurosurgery | Admitting: Neurosurgery

## 2014-08-05 DIAGNOSIS — M5136 Other intervertebral disc degeneration, lumbar region: Secondary | ICD-10-CM | POA: Diagnosis not present

## 2014-08-05 DIAGNOSIS — M4806 Spinal stenosis, lumbar region: Secondary | ICD-10-CM | POA: Insufficient documentation

## 2014-08-05 DIAGNOSIS — M4807 Spinal stenosis, lumbosacral region: Secondary | ICD-10-CM | POA: Diagnosis not present

## 2014-08-05 LAB — CREATININE, SERUM
Creatinine, Ser: 0.82 mg/dL (ref 0.61–1.24)
GFR calc non Af Amer: >60 mL/min (ref >60)

## 2014-08-05 LAB — BUN: BUN: 14 mg/dL (ref 6–20)

## 2014-08-05 MED ORDER — GADOBENATE DIMEGLUMINE 529 MG/ML IV SOLN
20.0000 mL | Freq: Once | INTRAVENOUS | Status: AC | PRN
  Administered 2014-08-05: 20 mL via INTRAVENOUS

## 2014-08-18 DIAGNOSIS — Z79899 Other long term (current) drug therapy: Secondary | ICD-10-CM | POA: Insufficient documentation

## 2016-01-23 HISTORY — PX: BACK SURGERY: SHX140

## 2016-03-27 ENCOUNTER — Other Ambulatory Visit: Payer: Self-pay | Admitting: Neurosurgery

## 2016-03-27 DIAGNOSIS — M48062 Spinal stenosis, lumbar region with neurogenic claudication: Secondary | ICD-10-CM

## 2016-04-07 ENCOUNTER — Ambulatory Visit
Admission: RE | Admit: 2016-04-07 | Discharge: 2016-04-07 | Disposition: A | Discharge status: Home / Self Care | Payer: BLUE CROSS/BLUE SHIELD | Source: Ambulatory Visit | Attending: Neurosurgery | Admitting: Neurosurgery

## 2016-04-07 DIAGNOSIS — M48062 Spinal stenosis, lumbar region with neurogenic claudication: Secondary | ICD-10-CM

## 2016-04-07 MED ORDER — GADOBENATE DIMEGLUMINE 529 MG/ML IV SOLN
20.0000 mL | Freq: Once | INTRAVENOUS | Status: AC | PRN
  Administered 2016-04-07: 20 mL via INTRAVENOUS

## 2016-04-12 ENCOUNTER — Other Ambulatory Visit: Payer: Self-pay | Admitting: Neurosurgery

## 2016-04-27 NOTE — Pre-Procedure Instructions (Addendum)
Mike Patel  04/27/2016      [id number] - [place], [place] - [address] [place] [address] Phone: [phone] Fax: [phone]    Your procedure is scheduled on 05/07/16  Report to [facility] Admitting at 600 A.M.  Call this number if you have problems the morning of surgery:  [phone]   Remember:  Do not eat food or drink liquids after midnight.  Take these medicines the morning of surgery with A SIP OF WATER      Amlodipine(norvasc),lansoprazole(prevacid), metoprolol(toprol)  STOP all herbel meds, nsaids (aleve,naproxen,advil,ibuprofen) 7 days prior to surgery starting today  Including all vitamins/supplements,aspirin   Do not wear jewelry, make-up or nail polish.  Do not wear lotions, powders, or perfumes, or deoderant.  Do not shave 48 hours prior to surgery.  Men may shave face and neck.  Do not bring valuables to the hospital.  [facility] is not responsible for any belongings or valuables.  Contacts, dentures or bridgework may not be worn into surgery.  Leave your suitcase in the car.  After surgery it may be brought to your room.  For patients admitted to the hospital, discharge time will be determined by your treatment team.  Patients discharged the day of surgery will not be allowed to drive home.   Special instructions:   Special Instructions:  - Preparing for Surgery  Before surgery, you can play an important role.  Because skin is not sterile, your skin needs to be as free of germs as possible.  You can reduce the number of germs on you skin by washing with CHG (chlorahexidine gluconate) soap before surgery.  CHG is an antiseptic cleaner which kills germs and bonds with the skin to continue killing germs even after washing.  Please DO NOT use if you have an allergy to CHG or antibacterial soaps.  If your skin becomes reddened/irritated stop using the CHG and inform your nurse when you arrive at Short Stay.  Do not  shave (including legs and underarms) for at least 48 hours prior to the first CHG shower.  You may shave your face.  Please follow these instructions carefully:   1.  Shower with CHG Soap the night before surgery and the morning of Surgery.  2.  If you choose to wash your hair, wash your hair first as usual with your normal shampoo.  3.  After you shampoo, rinse your hair and body thoroughly to remove the Shampoo.  4.  Use CHG as you would any other liquid soap.  You can apply chg directly  to the skin and wash gently with scrungie or a clean washcloth.  5.  Apply the CHG Soap to your body ONLY FROM THE NECK DOWN.  Do not use on open wounds or open sores.  Avoid contact with your eyes ears, mouth and genitals (private parts).  Wash genitals (private parts)       with your normal soap.  6.  Wash thoroughly, paying special attention to the area where your surgery will be performed.  7.  Thoroughly rinse your body with warm water from the neck down.  8.  DO NOT shower/wash with your normal soap after using and rinsing off the CHG Soap.  9.  Pat yourself dry with a clean towel.            10.  Wear clean pajamas.  11.  Place clean sheets on your bed the night of your first shower and do not sleep with pets.  Day of Surgery  Do not apply any lotions/deodorants the morning of surgery.  Please wear clean clothes to the hospital/surgery center.  Please read over the  fact sheets that you were given.

## 2016-04-30 ENCOUNTER — Encounter (HOSPITAL_COMMUNITY): Payer: Self-pay

## 2016-04-30 ENCOUNTER — Encounter (HOSPITAL_COMMUNITY)
Admission: RE | Admit: 2016-04-30 | Discharge: 2016-04-30 | Disposition: A | Discharge status: Home / Self Care | Payer: BLUE CROSS/BLUE SHIELD | Source: Ambulatory Visit | Attending: Neurosurgery | Admitting: Neurosurgery

## 2016-04-30 DIAGNOSIS — Z0183 Encounter for blood typing: Secondary | ICD-10-CM | POA: Insufficient documentation

## 2016-04-30 DIAGNOSIS — Z01812 Encounter for preprocedural laboratory examination: Secondary | ICD-10-CM | POA: Insufficient documentation

## 2016-04-30 DIAGNOSIS — M48061 Spinal stenosis, lumbar region without neurogenic claudication: Secondary | ICD-10-CM | POA: Diagnosis not present

## 2016-04-30 DIAGNOSIS — Z01818 Encounter for other preprocedural examination: Secondary | ICD-10-CM | POA: Diagnosis present

## 2016-04-30 HISTORY — DX: Gastro-esophageal reflux disease without esophagitis: K21.9

## 2016-04-30 HISTORY — DX: Unspecified osteoarthritis, unspecified site: M19.90

## 2016-04-30 LAB — BASIC METABOLIC PANEL
Anion gap: 8 (ref 5–15)
BUN: 12 mg/dL (ref 6–20)
CALCIUM: 9.2 mg/dL (ref 8.9–10.3)
CO2: 25 mmol/L (ref 22–32)
CREATININE: 0.92 mg/dL (ref 0.61–1.24)
Chloride: 104 mmol/L (ref 101–111)
GFR calc Af Amer: >60 mL/min (ref >60)
GLUCOSE: 99 mg/dL (ref 65–99)
Potassium: 4.8 mmol/L (ref 3.5–5.1)
Sodium: 137 mmol/L (ref 135–145)

## 2016-04-30 LAB — CBC WITH DIFFERENTIAL/PLATELET
Basophils Absolute: 0 10*3/uL (ref 0.0–0.1)
Basophils Relative: 1 %
EOS ABS: 0.1 10*3/uL (ref 0.0–0.7)
EOS PCT: 2 %
HCT: 51.3 % (ref 39.0–52.0)
HEMOGLOBIN: 17 g/dL (ref 13.0–17.0)
LYMPHS ABS: 2.6 10*3/uL (ref 0.7–4.0)
Lymphocytes Relative: 33 %
MCH: 31 pg (ref 26.0–34.0)
MCHC: 33.1 g/dL (ref 30.0–36.0)
MCV: 93.4 fL (ref 78.0–100.0)
MONOS PCT: 11 %
Monocytes Absolute: 0.9 10*3/uL (ref 0.1–1.0)
Neutro Abs: 4.2 10*3/uL (ref 1.7–7.7)
Neutrophils Relative %: 53 %
PLATELETS: 229 10*3/uL (ref 150–400)
RBC: 5.49 MIL/uL (ref 4.22–5.81)
RDW: 13 % (ref 11.5–15.5)
WBC: 7.9 10*3/uL (ref 4.0–10.5)

## 2016-04-30 LAB — TYPE AND SCREEN
ABO/RH(D): O POS
Antibody Screen: NEGATIVE

## 2016-04-30 LAB — SURGICAL PCR SCREEN
MRSA, PCR: NEGATIVE
Staphylococcus aureus: POSITIVE — AB

## 2016-05-04 MED ORDER — DEXAMETHASONE SODIUM PHOSPHATE 10 MG/ML IJ SOLN
10.0000 mg | INTRAMUSCULAR | Status: AC
  Administered 2016-05-07: 10 mg via INTRAVENOUS
  Filled 2016-05-04: qty 1

## 2016-05-04 MED ORDER — DEXTROSE 5 % IV SOLN
3.0000 g | INTRAVENOUS | Status: AC
  Administered 2016-05-07: 3 g via INTRAVENOUS
  Filled 2016-05-04: qty 3000

## 2016-05-06 NOTE — Anesthesia Preprocedure Evaluation (Addendum)
Anesthesia Evaluation  Patient identified by MRN, date of birth, ID band Patient awake    Reviewed: Allergy & Precautions, H&P , NPO status , Patient's Chart, lab work & pertinent test results  Airway Mallampati: II  TM Distance: >3 FB Neck ROM: Full    Dental no notable dental hx. (+) Teeth Intact, Dental Advisory Given   Pulmonary sleep apnea and Continuous Positive Airway Pressure Ventilation ,    Pulmonary exam normal breath sounds clear to auscultation       Cardiovascular Exercise Tolerance: Good hypertension, Pt. on medications and Pt. on home beta blockers  Rhythm:Regular Rate:Normal     Neuro/Psych negative neurological ROS  negative psych ROS   GI/Hepatic Neg liver ROS, GERD  Medicated and Controlled,  Endo/Other  Morbid obesity  Renal/GU negative Renal ROS  negative genitourinary   Musculoskeletal  (+) Arthritis , Osteoarthritis,    Abdominal   Peds  Hematology negative hematology ROS (+)   Anesthesia Other Findings   Reproductive/Obstetrics negative OB ROS                            Anesthesia Physical Anesthesia Plan  ASA: III  Anesthesia Plan: General   Post-op Pain Management:    Induction: Intravenous  Airway Management Planned: Oral ETT  Additional Equipment:   Intra-op Plan:   Post-operative Plan: Extubation in OR  Informed Consent: I have reviewed the patients History and Physical, chart, labs and discussed the procedure including the risks, benefits and alternatives for the proposed anesthesia with the patient or authorized representative who has indicated his/her understanding and acceptance.   Dental advisory given  Plan Discussed with: CRNA, Anesthesiologist and Surgeon  Anesthesia Plan Comments:        Anesthesia Quick Evaluation

## 2016-05-07 ENCOUNTER — Inpatient Hospital Stay (HOSPITAL_COMMUNITY)
Admission: RE | Admit: 2016-05-07 | Discharge: 2016-05-08 | DRG: 460 | Disposition: A | Discharge status: Home / Self Care | Payer: BLUE CROSS/BLUE SHIELD | Source: Ambulatory Visit | Attending: Neurosurgery | Admitting: Neurosurgery

## 2016-05-07 ENCOUNTER — Inpatient Hospital Stay (HOSPITAL_COMMUNITY): Payer: BLUE CROSS/BLUE SHIELD

## 2016-05-07 ENCOUNTER — Inpatient Hospital Stay (HOSPITAL_COMMUNITY)
Admission: RE | Disposition: A | Discharge status: Home / Self Care | Payer: Self-pay | Source: Ambulatory Visit | Attending: Neurosurgery

## 2016-05-07 ENCOUNTER — Encounter (HOSPITAL_COMMUNITY): Payer: Self-pay | Admitting: Anesthesiology

## 2016-05-07 ENCOUNTER — Inpatient Hospital Stay (HOSPITAL_COMMUNITY): Payer: BLUE CROSS/BLUE SHIELD | Admitting: Emergency Medicine

## 2016-05-07 ENCOUNTER — Inpatient Hospital Stay (HOSPITAL_COMMUNITY): Payer: BLUE CROSS/BLUE SHIELD | Admitting: Anesthesiology

## 2016-05-07 DIAGNOSIS — G4730 Sleep apnea, unspecified: Secondary | ICD-10-CM | POA: Diagnosis present

## 2016-05-07 DIAGNOSIS — M4696 Unspecified inflammatory spondylopathy, lumbar region: Secondary | ICD-10-CM | POA: Diagnosis present

## 2016-05-07 DIAGNOSIS — M48062 Spinal stenosis, lumbar region with neurogenic claudication: Secondary | ICD-10-CM | POA: Diagnosis present

## 2016-05-07 DIAGNOSIS — M5136 Other intervertebral disc degeneration, lumbar region: Secondary | ICD-10-CM | POA: Diagnosis present

## 2016-05-07 DIAGNOSIS — I10 Essential (primary) hypertension: Secondary | ICD-10-CM | POA: Diagnosis present

## 2016-05-07 DIAGNOSIS — Z981 Arthrodesis status: Secondary | ICD-10-CM | POA: Diagnosis not present

## 2016-05-07 DIAGNOSIS — K219 Gastro-esophageal reflux disease without esophagitis: Secondary | ICD-10-CM | POA: Diagnosis present

## 2016-05-07 DIAGNOSIS — Z6841 Body Mass Index (BMI) 40.0 and over, adult: Secondary | ICD-10-CM

## 2016-05-07 DIAGNOSIS — Z419 Encounter for procedure for purposes other than remedying health state, unspecified: Secondary | ICD-10-CM

## 2016-05-07 SURGERY — POSTERIOR LUMBAR FUSION 2 LEVEL
Anesthesia: General | Site: Back

## 2016-05-07 MED ORDER — PANTOPRAZOLE SODIUM 40 MG PO TBEC
40.0000 mg | DELAYED_RELEASE_TABLET | Freq: Every day | ORAL | Status: DC
  Administered 2016-05-08: 40 mg via ORAL
  Filled 2016-05-07: qty 1

## 2016-05-07 MED ORDER — THROMBIN 20000 UNITS EX SOLR
CUTANEOUS | Status: AC
  Filled 2016-05-07: qty 20000

## 2016-05-07 MED ORDER — SODIUM CHLORIDE 0.9% FLUSH: 3.0000 mL | INTRAVENOUS | Status: DC | PRN

## 2016-05-07 MED ORDER — METOPROLOL SUCCINATE ER 25 MG PO TB24
50.0000 mg | ORAL_TABLET | Freq: Every day | ORAL | Status: DC
Start: 2016-05-08 — End: 2016-05-08
  Administered 2016-05-08: 50 mg via ORAL
  Filled 2016-05-07: qty 2

## 2016-05-07 MED ORDER — LIDOCAINE 2% (20 MG/ML) 5 ML SYRINGE
INTRAMUSCULAR | Status: AC
  Filled 2016-05-07: qty 5

## 2016-05-07 MED ORDER — DIPHENHYDRAMINE HCL 50 MG/ML IJ SOLN
INTRAMUSCULAR | Status: AC
  Filled 2016-05-07: qty 1

## 2016-05-07 MED ORDER — VANCOMYCIN HCL 1000 MG IV SOLR
INTRAVENOUS | Status: DC | PRN
Start: 2016-05-07 — End: 2016-05-07
  Administered 2016-05-07: 1000 mg

## 2016-05-07 MED ORDER — VANCOMYCIN HCL 1000 MG IV SOLR
INTRAVENOUS | Status: AC
  Filled 2016-05-07: qty 1000

## 2016-05-07 MED ORDER — SODIUM CHLORIDE 0.9 % IJ SOLN
INTRAMUSCULAR | Status: AC
  Filled 2016-05-07: qty 10

## 2016-05-07 MED ORDER — SODIUM CHLORIDE 0.9 % IV SOLN
INTRAVENOUS | Status: DC | PRN
  Administered 2016-05-07: 12:00:00 via INTRAVENOUS

## 2016-05-07 MED ORDER — HYDROMORPHONE HCL 1 MG/ML IJ SOLN
INTRAMUSCULAR | Status: AC
  Administered 2016-05-07: 0.5 mg via INTRAVENOUS
  Filled 2016-05-07: qty 1

## 2016-05-07 MED ORDER — AMLODIPINE BESYLATE 10 MG PO TABS
10.0000 mg | ORAL_TABLET | Freq: Every day | ORAL | Status: DC
  Administered 2016-05-08: 10 mg via ORAL
  Filled 2016-05-07: qty 1

## 2016-05-07 MED ORDER — TESTOSTERONE CYPIONATE 100 MG/ML IM SOLN
100.0000 mg | INTRAMUSCULAR | Status: DC
  Filled 2016-05-07: qty 1

## 2016-05-07 MED ORDER — VECURONIUM BROMIDE 10 MG IV SOLR
INTRAVENOUS | Status: DC | PRN
  Administered 2016-05-07: 2 mg via INTRAVENOUS

## 2016-05-07 MED ORDER — ALBUMIN HUMAN 5 % IV SOLN
INTRAVENOUS | Status: DC | PRN
  Administered 2016-05-07: 10:00:00 via INTRAVENOUS

## 2016-05-07 MED ORDER — SUCCINYLCHOLINE CHLORIDE 200 MG/10ML IV SOSY
PREFILLED_SYRINGE | INTRAVENOUS | Status: AC
  Filled 2016-05-07: qty 10

## 2016-05-07 MED ORDER — EPHEDRINE 5 MG/ML INJ
INTRAVENOUS | Status: AC
  Filled 2016-05-07: qty 10

## 2016-05-07 MED ORDER — ONDANSETRON HCL 4 MG/2ML IJ SOLN
4.0000 mg | Freq: Four times a day (QID) | INTRAMUSCULAR | Status: DC | PRN
  Administered 2016-05-07 (×2): 4 mg via INTRAVENOUS
  Filled 2016-05-07 (×2): qty 2

## 2016-05-07 MED ORDER — SODIUM CHLORIDE 0.9 % IV SOLN: 250.0000 mL | INTRAVENOUS | Status: DC

## 2016-05-07 MED ORDER — VECURONIUM BROMIDE 10 MG IV SOLR
INTRAVENOUS | Status: AC
  Filled 2016-05-07: qty 10

## 2016-05-07 MED ORDER — CHLORHEXIDINE GLUCONATE CLOTH 2 % EX PADS: 6.0000 | MEDICATED_PAD | Freq: Once | CUTANEOUS | Status: DC

## 2016-05-07 MED ORDER — MIDAZOLAM HCL 5 MG/5ML IJ SOLN
INTRAMUSCULAR | Status: DC | PRN
  Administered 2016-05-07: 2 mg via INTRAVENOUS

## 2016-05-07 MED ORDER — PROPOFOL 10 MG/ML IV BOLUS
INTRAVENOUS | Status: DC | PRN
  Administered 2016-05-07: 200 mg via INTRAVENOUS

## 2016-05-07 MED ORDER — PROPOFOL 10 MG/ML IV BOLUS
INTRAVENOUS | Status: AC
  Filled 2016-05-07: qty 20

## 2016-05-07 MED ORDER — HYDROMORPHONE HCL 1 MG/ML IJ SOLN
0.5000 mg | INTRAMUSCULAR | Status: DC | PRN
  Administered 2016-05-07: 1 mg via INTRAVENOUS
  Filled 2016-05-07: qty 1

## 2016-05-07 MED ORDER — THROMBIN 20000 UNITS EX SOLR
CUTANEOUS | Status: DC | PRN
  Administered 2016-05-07: 20 mL via TOPICAL

## 2016-05-07 MED ORDER — HYDROMORPHONE HCL 1 MG/ML IJ SOLN
0.5000 mg | INTRAMUSCULAR | Status: AC | PRN
  Administered 2016-05-07 (×2): 0.5 mg via INTRAVENOUS

## 2016-05-07 MED ORDER — PHENYLEPHRINE HCL 10 MG/ML IJ SOLN
INTRAMUSCULAR | Status: DC | PRN
  Administered 2016-05-07: 50 ug/min via INTRAVENOUS

## 2016-05-07 MED ORDER — SODIUM CHLORIDE 0.9% FLUSH
3.0000 mL | Freq: Two times a day (BID) | INTRAVENOUS | Status: DC
  Administered 2016-05-07: 3 mL via INTRAVENOUS

## 2016-05-07 MED ORDER — SODIUM CHLORIDE 0.9 % IR SOLN
Status: DC | PRN
  Administered 2016-05-07: 500 mL

## 2016-05-07 MED ORDER — ARTIFICIAL TEARS OP OINT
TOPICAL_OINTMENT | OPHTHALMIC | Status: DC | PRN
  Administered 2016-05-07: 1 via OPHTHALMIC

## 2016-05-07 MED ORDER — LACTATED RINGERS IV SOLN
INTRAVENOUS | Status: DC | PRN
  Administered 2016-05-07 (×3): via INTRAVENOUS

## 2016-05-07 MED ORDER — PHENYLEPHRINE HCL 10 MG/ML IJ SOLN
INTRAMUSCULAR | Status: DC | PRN
  Administered 2016-05-07: 120 ug via INTRAVENOUS
  Administered 2016-05-07 (×2): 80 ug via INTRAVENOUS

## 2016-05-07 MED ORDER — DIAZEPAM 5 MG PO TABS
5.0000 mg | ORAL_TABLET | Freq: Four times a day (QID) | ORAL | Status: DC | PRN
  Administered 2016-05-07: 10 mg via ORAL
  Filled 2016-05-07: qty 2

## 2016-05-07 MED ORDER — ONDANSETRON HCL 4 MG/2ML IJ SOLN
INTRAMUSCULAR | Status: DC | PRN
  Administered 2016-05-07: 4 mg via INTRAVENOUS

## 2016-05-07 MED ORDER — ONDANSETRON HCL 4 MG PO TABS: 4.0000 mg | ORAL_TABLET | Freq: Four times a day (QID) | ORAL | Status: DC | PRN

## 2016-05-07 MED ORDER — PHENOL 1.4 % MT LIQD
1.0000 | OROMUCOSAL | Status: DC | PRN
Start: 2016-05-07 — End: 2016-05-08

## 2016-05-07 MED ORDER — BUPIVACAINE HCL (PF) 0.25 % IJ SOLN
INTRAMUSCULAR | Status: AC
  Filled 2016-05-07: qty 30

## 2016-05-07 MED ORDER — BUPIVACAINE HCL (PF) 0.25 % IJ SOLN
INTRAMUSCULAR | Status: DC | PRN
  Administered 2016-05-07: 20 mL

## 2016-05-07 MED ORDER — HYDROMORPHONE HCL 1 MG/ML IJ SOLN
0.2500 mg | INTRAMUSCULAR | Status: DC | PRN
  Administered 2016-05-07 (×4): 0.5 mg via INTRAVENOUS

## 2016-05-07 MED ORDER — FENTANYL CITRATE (PF) 250 MCG/5ML IJ SOLN
INTRAMUSCULAR | Status: AC
  Filled 2016-05-07: qty 5

## 2016-05-07 MED ORDER — MIDAZOLAM HCL 2 MG/2ML IJ SOLN
INTRAMUSCULAR | Status: AC
  Filled 2016-05-07: qty 2

## 2016-05-07 MED ORDER — MENTHOL 3 MG MT LOZG: 1.0000 | LOZENGE | OROMUCOSAL | Status: DC | PRN

## 2016-05-07 MED ORDER — ROCURONIUM BROMIDE 100 MG/10ML IV SOLN
INTRAVENOUS | Status: DC | PRN
  Administered 2016-05-07 (×2): 50 mg via INTRAVENOUS

## 2016-05-07 MED ORDER — LIDOCAINE HCL (CARDIAC) 20 MG/ML IV SOLN
INTRAVENOUS | Status: DC | PRN
  Administered 2016-05-07: 60 mg via INTRAVENOUS

## 2016-05-07 MED ORDER — LACTATED RINGERS IV SOLN
Freq: Once | INTRAVENOUS | Status: AC
  Administered 2016-05-07: 07:00:00 via INTRAVENOUS

## 2016-05-07 MED ORDER — HYDROMORPHONE HCL 1 MG/ML IJ SOLN
INTRAMUSCULAR | Status: AC
  Filled 2016-05-07: qty 0.5

## 2016-05-07 MED ORDER — CEFAZOLIN IN D5W 1 GM/50ML IV SOLN
1.0000 g | Freq: Three times a day (TID) | INTRAVENOUS | Status: AC
  Administered 2016-05-07 – 2016-05-08 (×2): 1 g via INTRAVENOUS
  Filled 2016-05-07 (×2): qty 50

## 2016-05-07 MED ORDER — SUGAMMADEX SODIUM 500 MG/5ML IV SOLN
INTRAVENOUS | Status: DC | PRN
  Administered 2016-05-07: 500 mg via INTRAVENOUS

## 2016-05-07 MED ORDER — SODIUM CHLORIDE 0.9 % IR SOLN
Status: DC | PRN
  Administered 2016-05-07: 1000 mL

## 2016-05-07 MED ORDER — PHENYLEPHRINE 40 MCG/ML (10ML) SYRINGE FOR IV PUSH (FOR BLOOD PRESSURE SUPPORT)
PREFILLED_SYRINGE | INTRAVENOUS | Status: AC
  Filled 2016-05-07: qty 10

## 2016-05-07 MED ORDER — SUCCINYLCHOLINE CHLORIDE 20 MG/ML IJ SOLN
INTRAMUSCULAR | Status: DC | PRN
  Administered 2016-05-07: 120 mg via INTRAVENOUS

## 2016-05-07 MED ORDER — HYDROMORPHONE HCL 1 MG/ML IJ SOLN
INTRAMUSCULAR | Status: AC
  Administered 2016-05-07: 0.5 mg via INTRAVENOUS
  Filled 2016-05-07: qty 0.5

## 2016-05-07 MED ORDER — FENTANYL CITRATE (PF) 100 MCG/2ML IJ SOLN
INTRAMUSCULAR | Status: DC | PRN
  Administered 2016-05-07: 50 ug via INTRAVENOUS
  Administered 2016-05-07: 150 ug via INTRAVENOUS
  Administered 2016-05-07: 50 ug via INTRAVENOUS

## 2016-05-07 MED ORDER — ROCURONIUM BROMIDE 50 MG/5ML IV SOSY
PREFILLED_SYRINGE | INTRAVENOUS | Status: AC
  Filled 2016-05-07: qty 10

## 2016-05-07 MED ORDER — EPHEDRINE SULFATE 50 MG/ML IJ SOLN
INTRAMUSCULAR | Status: DC | PRN
  Administered 2016-05-07 (×2): 10 mg via INTRAVENOUS
  Administered 2016-05-07: 15 mg via INTRAVENOUS

## 2016-05-07 MED ORDER — LISINOPRIL 20 MG PO TABS
40.0000 mg | ORAL_TABLET | Freq: Two times a day (BID) | ORAL | Status: DC
  Administered 2016-05-08: 40 mg via ORAL
  Filled 2016-05-07 (×2): qty 2

## 2016-05-07 MED ORDER — HYDROCODONE-ACETAMINOPHEN 10-325 MG PO TABS
1.0000 | ORAL_TABLET | ORAL | Status: DC | PRN
  Administered 2016-05-07: 2 via ORAL
  Administered 2016-05-07: 1 via ORAL
  Administered 2016-05-08 (×2): 2 via ORAL
  Filled 2016-05-07: qty 2
  Filled 2016-05-07: qty 1
  Filled 2016-05-07 (×2): qty 2

## 2016-05-07 MED FILL — Sodium Chloride IV Soln 0.9%: INTRAVENOUS | Qty: 3000 | Status: AC

## 2016-05-07 MED FILL — Heparin Sodium (Porcine) Inj 1000 Unit/ML: INTRAMUSCULAR | Qty: 30 | Status: AC

## 2016-05-07 SURGICAL SUPPLY — 63 items
BAG DECANTER FOR FLEXI CONT (MISCELLANEOUS) ×3 IMPLANT
BENZOIN TINCTURE PRP APPL 2/3 (GAUZE/BANDAGES/DRESSINGS) ×3 IMPLANT
BLADE CLIPPER SURG (BLADE) IMPLANT
BUR CUTTER 7.0 ROUND (BURR) ×3 IMPLANT
BUR MATCHSTICK NEURO 3.0 LAGG (BURR) ×3 IMPLANT
CANISTER SUCT 3000ML PPV (MISCELLANEOUS) ×3 IMPLANT
CAP LCK SPNE (Orthopedic Implant) ×6 IMPLANT
CAP LOCK SPINE RADIUS (Orthopedic Implant) ×6 IMPLANT
CAP LOCKING (Orthopedic Implant) ×12 IMPLANT
CARTRIDGE OIL MAESTRO DRILL (MISCELLANEOUS) ×1 IMPLANT
CLOSURE WOUND 1/2 X4 (GAUZE/BANDAGES/DRESSINGS) ×1
CONT SPEC 4OZ CLIKSEAL STRL BL (MISCELLANEOUS) ×3 IMPLANT
COVER BACK TABLE 60X90IN (DRAPES) ×3 IMPLANT
DECANTER SPIKE VIAL GLASS SM (MISCELLANEOUS) IMPLANT
DERMABOND ADVANCED (GAUZE/BANDAGES/DRESSINGS) ×2
DERMABOND ADVANCED .7 DNX12 (GAUZE/BANDAGES/DRESSINGS) ×1 IMPLANT
DEVICE INTERBODY ELEVATE 23X9 (Cage) ×6 IMPLANT
DIFFUSER DRILL AIR PNEUMATIC (MISCELLANEOUS) ×3 IMPLANT
DRAPE C-ARM 42X72 X-RAY (DRAPES) ×6 IMPLANT
DRAPE HALF SHEET 40X57 (DRAPES) ×3 IMPLANT
DRAPE LAPAROTOMY 100X72X124 (DRAPES) ×3 IMPLANT
DRAPE POUCH INSTRU U-SHP 10X18 (DRAPES) ×3 IMPLANT
DRAPE SURG 17X23 STRL (DRAPES) ×12 IMPLANT
DRSG OPSITE POSTOP 4X8 (GAUZE/BANDAGES/DRESSINGS) ×3 IMPLANT
DURAPREP 26ML APPLICATOR (WOUND CARE) ×3 IMPLANT
ELECT CAUTERY BLADE 6.4 (BLADE) ×3 IMPLANT
ELECT REM PT RETURN 9FT ADLT (ELECTROSURGICAL) ×3
ELECTRODE REM PT RTRN 9FT ADLT (ELECTROSURGICAL) ×1 IMPLANT
EVACUATOR 1/8 PVC DRAIN (DRAIN) IMPLANT
GAUZE SPONGE 4X4 12PLY STRL (GAUZE/BANDAGES/DRESSINGS) IMPLANT
GAUZE SPONGE 4X4 16PLY XRAY LF (GAUZE/BANDAGES/DRESSINGS) IMPLANT
GLOVE ECLIPSE 9.0 STRL (GLOVE) ×6 IMPLANT
GLOVE EXAM NITRILE LRG STRL (GLOVE) IMPLANT
GLOVE EXAM NITRILE XL STR (GLOVE) IMPLANT
GLOVE EXAM NITRILE XS STR PU (GLOVE) ×6 IMPLANT
GOWN STRL REUS W/ TWL LRG LVL3 (GOWN DISPOSABLE) ×2 IMPLANT
GOWN STRL REUS W/ TWL XL LVL3 (GOWN DISPOSABLE) ×2 IMPLANT
GOWN STRL REUS W/TWL 2XL LVL3 (GOWN DISPOSABLE) IMPLANT
GOWN STRL REUS W/TWL LRG LVL3 (GOWN DISPOSABLE) ×4
GOWN STRL REUS W/TWL XL LVL3 (GOWN DISPOSABLE) ×4
GRAFT BN 10X1XDBM MAGNIFUSE (Bone Implant) ×1 IMPLANT
GRAFT BONE MAGNIFUSE 1X10CM (Bone Implant) ×2 IMPLANT
KIT BASIN OR (CUSTOM PROCEDURE TRAY) ×3 IMPLANT
KIT ROOM TURNOVER OR (KITS) ×3 IMPLANT
NEEDLE HYPO 22GX1.5 SAFETY (NEEDLE) ×3 IMPLANT
NS IRRIG 1000ML POUR BTL (IV SOLUTION) ×3 IMPLANT
OIL CARTRIDGE MAESTRO DRILL (MISCELLANEOUS) ×3
PACK LAMINECTOMY NEURO (CUSTOM PROCEDURE TRAY) ×3 IMPLANT
ROD 100MM (Rod) ×2 IMPLANT
ROD 90MM RADIUS (Rod) ×3 IMPLANT
ROD SPNL 100X5.5XNS TI RDS (Rod) ×1 IMPLANT
SCREW 6.75X50MM (Screw) ×12 IMPLANT
SPACER ELEVATE STD 23X11 (Spacer) ×6 IMPLANT
SPONGE SURGIFOAM ABS GEL 100 (HEMOSTASIS) ×3 IMPLANT
STRIP CLOSURE SKIN 1/2X4 (GAUZE/BANDAGES/DRESSINGS) ×2 IMPLANT
SUT VIC AB 0 CT1 18XCR BRD8 (SUTURE) ×2 IMPLANT
SUT VIC AB 0 CT1 8-18 (SUTURE) ×4
SUT VIC AB 2-0 CT1 18 (SUTURE) ×3 IMPLANT
SUT VIC AB 3-0 SH 8-18 (SUTURE) ×6 IMPLANT
TOWEL GREEN STERILE (TOWEL DISPOSABLE) ×2 IMPLANT
TOWEL GREEN STERILE FF (TOWEL DISPOSABLE) ×3 IMPLANT
TRAY FOLEY W/METER SILVER 16FR (SET/KITS/TRAYS/PACK) ×3 IMPLANT
WATER STERILE IRR 1000ML POUR (IV SOLUTION) ×3 IMPLANT

## 2016-05-07 NOTE — Evaluation (Signed)
Physical Therapy Evaluation Patient Details Name: Mike Patel MRN: [id number] DOB: 1960-05-24 Today's Date: 05/07/2016   History of Present Illness  56 y.o. male s/p L2-3 and L3-4 fusion 05/07/16. PMH significant for GERD, HTN, Obesity (343 lbs) and sleep apnea (on CPAP). Past surgical history significant for L4-5 fusion.   Clinical Impression  Pt presents following elective lumbar fusion and tolerated OOB mobility/ambulation, however, is limited by nausea. RN notified and addressed following session. Anticipate pt will progress well once nausea is resolved and tolerate increased ambulation and stair negotiation. Recommend d/c home with HHPT for safe transition home. If pt unable to negotiate stairs safely, may need SNF. Acute PT will follow.     Follow Up Recommendations Home health PT;Supervision/Assistance - 24 hour    Equipment Recommendations  Rolling walker with 5" wheels;3in1 (PT)    Recommendations for Other Services       Precautions / Restrictions Precautions Precautions: Back;Fall Precaution Booklet Issued: Yes (comment) Precaution Comments: reviewed back precautions and pt verbalized understanding Required Braces or Orthoses: Spinal Brace Spinal Brace: Lumbar corset;Applied in sitting position Restrictions Weight Bearing Restrictions: No      Mobility  Bed Mobility Overal bed mobility: Needs Assistance Bed Mobility: Rolling;Sidelying to Sit Rolling: Min assist Sidelying to sit: Min assist       General bed mobility comments: pt required verbal cues for sequencing for log roll to sit technique and for compliance with back precautions. Pt required minA to elevate trunk to upright. increased time and effort. pt nauseous, dizzy and diaphoretic in seated. Pt BP was 146/82. no episodes of emesis. symptoms resolved within 5 minutes  Transfers Overall transfer level: Needs assistance Equipment used: Rolling walker (2 wheeled) Transfers: Sit to/from Stand Sit to  Stand: Mod assist         General transfer comment: modA to power up. verbal cues for hand placement and safety with RW. verbal and tactile cues for compliance with back precautions. increased time and effort to achieve upright with verbal cues for contraction of abdominal muscles to offload stress on back  Ambulation/Gait Ambulation/Gait assistance: Mod assist Ambulation Distance (Feet): 10 Feet Assistive device: Rolling walker (2 wheeled) Gait Pattern/deviations: Step-through pattern;Decreased stride length;Trunk flexed Gait velocity: decreased Gait velocity interpretation: Below normal speed for age/gender General Gait Details: verbal cues for upright posture. modA for stability to prevent LOB and safety with RW. Pt required verbal cues for upright posture and use of abdominal muscles to reduce strain on back. Pt with nausea following ambulation, however, no episodes of emesis.   Stairs            Wheelchair Mobility    Modified Rankin (Stroke Patients Only)       Balance Overall balance assessment: Needs assistance Sitting-balance support: Feet supported;No upper extremity supported Sitting balance-Leahy Scale: Fair Sitting balance - Comments: sat EOB x 3 min during BP and due to nausea   Standing balance support: Bilateral upper extremity supported Standing balance-Leahy Scale: Poor Standing balance comment: reliant on RW                             Pertinent Vitals/Pain Pain Assessment: 0-10 Pain Score: 7  Pain Location: surgical site Pain Descriptors / Indicators: Discomfort Pain Intervention(s): Limited activity within patient's tolerance;Monitored during session;Repositioned    Home Living Family/patient expects to be discharged to:: Private residence Living Arrangements: Spouse/significant other Available Help at Discharge: Family;Available 24 hours/day Type of Home: House  Home Access: Stairs to enter Entrance Stairs-Rails:  Psychiatric nurse of Steps: 3 Home Layout: One level Home Equipment: None Additional Comments: pt lives with wife who can provide 24/7 assist. 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, newspaper   Dominant Hand: Right    Extremity/Trunk Assessment   Upper Extremity Assessment Upper Extremity Assessment: Overall WFL for tasks assessed    Lower Extremity Assessment Lower Extremity Assessment: Generalized weakness    Cervical / Trunk Assessment Cervical / Trunk Assessment: Other exceptions Cervical / Trunk Exceptions: spinal surgery  Communication   Communication: No difficulties  Cognition Arousal/Alertness: Awake/alert Behavior During Therapy: WFL for tasks assessed/performed Overall Cognitive Status: Within Functional Limits for tasks assessed                                 General Comments: pt behavior was normal despite being nauseous      General Comments General comments (skin integrity, edema, etc.): RN notified and was to give anti-nausea medication following PT session.     Exercises     Assessment/Plan    PT Assessment Patient needs continued PT services  PT Problem List Decreased strength;Decreased activity tolerance;Decreased balance;Decreased mobility;Decreased knowledge of use of DME;Decreased safety awareness;Decreased knowledge of precautions;Obesity;Pain       PT Treatment Interventions DME instruction;Gait training;Stair training;Functional mobility training;Therapeutic activities;Therapeutic exercise;Balance training;Neuromuscular re-education;Patient/family education    PT Goals (Current goals can be found in the Care Plan section)  Acute Rehab PT Goals Patient Stated Goal: get back to work PT Goal Formulation: With patient Time For Goal Achievement: 05/21/16 Potential to  Achieve Goals: Fair    Frequency Min 5X/week   Barriers to discharge        Co-evaluation               End of Session Equipment Utilized During Treatment: Gait belt;Back brace Activity Tolerance: Patient tolerated treatment well Patient left: in chair;with call bell/phone within reach;with nursing/sitter in room;with family/visitor present Nurse Communication: Mobility status;Other (comment) (nausea) PT Visit Diagnosis: Unsteadiness on feet (R26.81);Other abnormalities of gait and mobility (R26.89);Muscle weakness (generalized) (M62.81);Difficulty in walking, not elsewhere classified (R26.2)    Time: 6720-9470 PT Time Calculation (min) (ACUTE ONLY): 23 min   Charges:   PT Evaluation $PT Eval Moderate Complexity: 1 Procedure PT Treatments $Therapeutic Activity: 8-22 mins   PT G CodesTracie Harrier, SPT Acute Rehab SPT [phone]    Tracie Harrier 05/07/2016, 4:37 PM

## 2016-05-07 NOTE — Progress Notes (Signed)
Placed patient on CPAP via auto-mode wit minimum pressure set at 5cm and maximum pressure of 20cm

## 2016-05-07 NOTE — H&P (Signed)
  Mike Patel is an 56 y.o. male.   Chief Complaint: Back pain HPI: The patient is a 56 year old male status post prior L4-5 decompression and fusion with good results. Patient presents with progressive back and bilateral lower extremity symptoms consistent with neurogenic claudication. The symptoms have failed conservative management. Workup demonstrates evidence of marked adjacent level degeneration at L2-3 and L3-4 with severe stenosis. Patient presents now for two-level lumbar decompression infusion in hopes of improving his symptoms.  Past Medical History:  Diagnosis Date  . Arthritis   . GERD (gastroesophageal reflux disease)   . Hypertension   . Obesity   . Sleep apnea    cpap    Past Surgical History:  Procedure Laterality Date  . BACK SURGERY     x2  . COLONOSCOPY N/A 05/24/2014   Procedure: COLONOSCOPY;  Surgeon: Hulen Luster, MD;  Location[facility] END[facility]COPY;  Service: Gastroenterology;  Laterality: N/A;  . ELBOW SURGERY Right    tennis elbow  . ESOPHAGOGASTRODUODENOSCOPY N/A 05/24/2014   Procedure: ESOPHAGOGASTRODUODENOSCOPY (EGD);  Surgeon: Hulen Luster, MD;  Location: [facility] ENDOSCOPY;  Service: Gastroenterology;  Laterality: N/A;  . PROSTATE SURGERY    . SPINE SURGERY     l-spine x 2    History reviewed. No pertinent family history. Social History:  reports that he has never smoked. He has never used smokeless tobacco. He reports that he does not drink alcohol or use drugs.  Allergies:  Allergies  Allergen Reactions  . No Known Allergies     Medications Prior to Admission  Medication Sig Dispense Refill  . amLODipine (NORVASC) 10 MG tablet Take 10 mg by mouth daily.    . lansoprazole (PREVACID) 30 MG capsule Take 30 mg by mouth every morning.     Marland Kitchen lisinopril (PRINIVIL,ZESTRIL) 40 MG tablet Take 40 mg by mouth 2 (two) times daily.    . metoprolol succinate (TOPROL-XL) 50 MG 24 hr tablet Take 50 mg by mouth daily. Take with or immediately following a meal.    .  testosterone cypionate (DEPOTESTOTERONE CYPIONATE) 100 MG/ML injection Inject 100 mg into the muscle every 14 (fourteen) days. For IM use only    . aspirin 81 MG tablet Take 81 mg by mouth daily.      No results found for this or any previous visit (from the past 48 hour(s)). No results found.  Pertinent items noted in HPI and remainder of comprehensive ROS otherwise negative.  Blood pressure (!) 167/99, pulse 76, temperature 98.3 F (36.8 C), resp. rate 20, SpO2 97 %.  The patient is awake and alert. He is oriented and appropriate. He is overweight. Examination of his head ears eyes and thirds unremarkable. Chest and abdomen are benign. Extremities are free from injury deformity. Neurologically cranial nerve function normal bilaterally. Speech fluent. Judgment and insight intact. Motor examination his extremities are normal bilaterally. Sensory examination nonfocal. Deep tendon reflexes hypoactive in his lower extremities but otherwise symmetric. No evidence of long track signs. Gait is antalgic. Posture is flexed. 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 Plan bilateral L2-3 L3-4 decompressive laminotomies and foraminotomies followed by posterior lumbar interbody fusion utilizing interbody expandable cages, locally harvested autograft, and augmented with posterior lateral arthrodesis utilizing segmental pedicle screw fixation and local autografting. Risks and benefits of been explained. Patient wishes to proceed.  [name] A 05/07/2016, 7:48 AM

## 2016-05-07 NOTE — Brief Op Note (Signed)
05/07/2016  11:57 AM  PATIENT:  Mike Patel  56 y.o. male  PRE-OPERATIVE DIAGNOSIS:  Stenosis  POST-OPERATIVE DIAGNOSIS:  Stenosis  PROCEDURE:  Procedure(s): Posterior Lumbar Lumbar Two-Three and Lumbar Three-Four Interbody and Fusion (N/A)  SURGEON:  Surgeon(s) and Role:    * Earnie Larsson, MD - Primary    * Kevan Ny Ditty, MD - Assisting  PHYSICIAN ASSISTANT:   ASSISTANTS:    ANESTHESIA:   general  EBL:  Total I/O In: 9371 [I.V.:2700; Blood:225; IV Piggyback:300] Out: 700 [Urine:150; Blood:550]  BLOOD ADMINISTERED:none  DRAINS: none   LOCAL MEDICATIONS USED:  MARCAINE     SPECIMEN:  No Specimen  DISPOSITION OF SPECIMEN:  N/A  COUNTS:  YES  TOURNIQUET:  * No tourniquets in log *  DICTATION: .Dragon Dictation  PLAN OF CARE: Admit to inpatient   PATIENT DISPOSITION:  PACU - hemodynamically stable.   Delay start of Pharmacological VTE agent (>24hrs) due to surgical blood loss or risk of bleeding: yes

## 2016-05-07 NOTE — Transfer of Care (Signed)
Immediate Anesthesia Transfer of Care Note  Patient: Mike Patel  Procedure(s) Performed: Procedure(s): Posterior Lumbar Lumbar Two-Three and Lumbar Three-Four Interbody and Fusion (N/A)  Patient Location: PACU  Anesthesia Type:General  Level of Consciousness: sedated  Airway & Oxygen Therapy: Patient Spontanous Breathing and Patient connected to face mask oxygen  Post-op Assessment: Report given to RN, Post -op Vital signs reviewed and stable and Patient moving all extremities X 4  Post vital signs: Reviewed and stable  Last Vitals:  Vitals:   05/07/16 0623  BP: (!) 167/99  Pulse: 76  Resp: 20  Temp: 36.8 C    Last Pain:  Vitals:   05/07/16 0651  PainSc: 4       Patients Stated Pain Goal: 3 (44/45/84 8350)  Complications: No apparent anesthesia complications

## 2016-05-07 NOTE — Anesthesia Postprocedure Evaluation (Signed)
Anesthesia Post Note  Patient: NATE COMMON  Procedure(s) Performed: Procedure(s) (LRB): Posterior Lumbar Lumbar Two-Three and Lumbar Three-Four Interbody and Fusion (N/A)  Patient location during evaluation: PACU Anesthesia Type: General Level of consciousness: awake and alert Pain management: pain level controlled Vital Signs Assessment: post-procedure vital signs reviewed and stable Respiratory status: spontaneous breathing, nonlabored ventilation, respiratory function stable and patient connected to nasal cannula oxygen Cardiovascular status: blood pressure returned to baseline and stable Postop Assessment: no signs of nausea or vomiting Anesthetic complications: no       Last Vitals:  Vitals:   05/07/16 1305 05/07/16 1340  BP: 126/81 139/67  Pulse: 79 83  Resp: 12 16  Temp:  36.8 C    Last Pain:  Vitals:   05/07/16 1340  TempSrc: Axillary  PainSc:                  Angala Hilgers,W. EDMOND

## 2016-05-07 NOTE — Anesthesia Procedure Notes (Signed)
Procedure Name: Intubation Date/Time: 05/07/2016 8:13 AM Performed by: Neldon Newport Pre-anesthesia Checklist: Timeout performed, Patient being monitored, Emergency Drugs available, Suction available and Patient identified Patient Re-evaluated:Patient Re-evaluated prior to inductionOxygen Delivery Method: Circle system utilized Preoxygenation: Pre-oxygenation with 100% oxygen Intubation Type: IV induction and Rapid sequence Ventilation: Mask ventilation without difficulty and Oral airway inserted - appropriate to patient size Laryngoscope Size: Mac and 3 Grade View: Grade II Tube type: Oral Tube size: 7.5 mm Number of attempts: 1 Placement Confirmation: breath sounds checked- equal and bilateral,  positive ETCO2 and ETT inserted through vocal cords under direct vision Secured at: 23 cm Tube secured with: Tape Dental Injury: Teeth and Oropharynx as per pre-operative assessment

## 2016-05-07 NOTE — Op Note (Signed)
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  Postoperative diagnosis: Same  Procedure Name: Bilateral L2-3, L3-4 decompressive laminotomies and foraminotomies, more than would be required for simple interbody fusion alone.  L2-3, L3-4 posterior lumbar interbody fusion utilizing interbody cages, locally harvested autograft,  L2-3 4 posterior lateral arthrodesis utilizing segmental pedicle screw fixation and local autografting  Reexploration of L4-5 fusion with removal of hardware  Surgeon:Pratik Dalziel A.Michiel Sivley, M.D.  Asst. Surgeon: Ditty  Anesthesia: General  Indication: 56 year old male status post prior L4-5 decompression and fusion. Patient presents now with severe back and bilateral lower extremity pain worsened by standing and walking consistent with neurogenic claudication. Workup demonstrates evidence of severe adjacent level disc degeneration with associated facet arthropathy and severe stenosis at L2-3 and L3-4. Patient has failed conservative management and presents now for decompression and fusion at L2-3 and L3-4. This will require reexploration of prior lumbar fusion L4-5 and removal of this instrumentation.  Operative note: After induction of anesthesia, patient position prone onto Wilson frame and a properly padded. Lumbar region prepped and draped sterilely. Incision made from L2-L5. Dissection performed bilaterally. Retractor placed. Fluoroscopy used. Levels confirmed. Previously placed instrumentation at L4-5 was dissected free, disassembled and and the fusion was inspected. Fusion at L4-5 was quite solid. Pedicle screws at L5 were removed. Pedicle screws at L4 left in place. Bilateral decompressive laminotomies and foraminotomies were performed using [place] rongeurs [employer] years high-speed drill to remove the inferior  two thirds of the lamina of L2 and L3. The entire inferior facet and pars and articularis of L2 and L3 bilaterally. An and the majority of the superior facet of L3 and L4 bilaterally. Ligament flavum and epidural scar were elevated and resected. Wide decompressive foraminotomies were performed on course exiting L2-L3 and L4 nerve roots bilaterally. Bilateral discectomies were then performed at L2-3 and L3-4. Disc spaces were then prepared for interbody fusion. With the distractor was placed the patient's right side at L3-4 on disc space was cleared of all soft tissue. Various scrapers were used to further prepare the interspace for fusion. An 11 mm Med[employer]onic expandable cage packed with locally harvested autograft was then impacted into place and expanded to its full extent. Distractor removed patient's right side. Procedure then repeated on the right side. Prior to installation of the second cage morselized autograft was impacted in the interspace and then a second cage packed with autograft was then impacted in place and expanded to its full extent. Procedure then repeated at L2-3 using 9 mm standard expandable implants and local autograft. Pedicles at L2 and L3 were done 5 using surface landmarks and intraoperative fluoroscopy. Superficial bone overlying the pedicle was removed using high-speed drill. Each pedicles and probed using a pedicle awl each pedicle awl track was probed and found to be solidly within the bone. Each pedicle awl track was tapped and then 6.75 mm radius brand screws from [employer] were placed bilaterally at L2 and L3. Final images revealed good position the cages and the pedicle screws at proper upper level with normal lamina spine. Wound is irrigated one final time. Transverse processes were decorticated. Morselize autograft was packed posterior laterally. Short segment titanium rod was placed over the screws at L2-L3 and L4. Locking caps placed over the screws. Locking caps and  engaged with the construct under mild compression. Gelfoam was placed topically overlying the laminotomy defects. Wounds and  close in layers with Vicryl sutures. Vancomycin powder was left in the deep wound space. Steri-Strips and sterile dressing were applied. No apparent complications. Patient tolerated the procedure well and he returns to the recovery room postop.

## 2016-05-08 MED ORDER — DIAZEPAM 5 MG PO TABS: 5.0000 mg | ORAL_TABLET | Freq: Four times a day (QID) | ORAL | 0 refills | Status: DC | PRN

## 2016-05-08 MED ORDER — OXYCODONE-ACETAMINOPHEN 5-325 MG PO TABS: 1.0000 | ORAL_TABLET | ORAL | 0 refills | Status: DC | PRN

## 2016-05-08 NOTE — Care Management Note (Signed)
Case Management Note  Patient Details  Name: Mike Patel MRN: [id number] Date of Birth: 04/24/1960  Subjective/Objective:   56 yr old gentleman s/p L2-3,L3-4 fusion.  \                Action/Plan: Patient will go for outpatient therapy after office followup appointment. CM has requested  batriatric 3in1.   Expected Discharge Date:  05/08/16               Expected Discharge Plan:     In-House Referral:     Discharge planning Services  CM Consult  Post Acute Care Choice:  Durable Medical Equipment Choice offered to:  NA  DME Arranged:  3-N-1 (needs bariatric 3in1) DME Agency:  [place]:  NA [place] Agency:  NA  Status of Service:  Completed, signed off  If discussed at [place] of Stay Meetings, dates discussed:    Additional Comments:  Ninfa Meeker, RN 05/08/2016, 11:38 AM

## 2016-05-08 NOTE — Discharge Instructions (Signed)

## 2016-05-08 NOTE — Progress Notes (Signed)
Physical Therapy Treatment Patient Details Name: Mike Patel MRN: [id number] DOB: 1960-03-20 Today's Date: 05/08/2016    History of Present Illness 56 y.o. male s/p L2-3 and L3-4 fusion 05/07/16. PMH significant for GERD, HTN, Obesity (343 lbs) and sleep apnea (on CPAP). Past surgical history significant for L4-5 fusion.     PT Comments    Pt progressing towards physical therapy goals. Was able to mobilize with increased independence this session and reports no nausea. Pt was educated on walking program, brace wearing schedule, posture/precautions. Feel pt is safe to return home with wife to assist. Will continue to follow until d/c.    Follow Up Recommendations  Outpatient PT;Supervision for mobility/OOB     Equipment Recommendations  3in1 (PT) (Bariatric)    Recommendations for Other Services       Precautions / Restrictions Precautions Precautions: Back;Fall Precaution Booklet Issued: Yes (comment) Precaution Comments: reviewed back precautions and pt verbalized understanding Required Braces or Orthoses: Spinal Brace Spinal Brace: Lumbar corset;Applied in sitting position Restrictions Weight Bearing Restrictions: No    Mobility  Bed Mobility               General bed mobility comments: Pt was received sitting up in the recliner.   Transfers Overall transfer level: Needs assistance Equipment used: Rolling walker (2 wheeled) Transfers: Sit to/from Stand Sit to Stand: Supervision         General transfer comment: VC's for hand placement on seated surface for safety.   Ambulation/Gait Ambulation/Gait assistance: Supervision;Min guard Ambulation Distance (Feet): 350 Feet Assistive device: Rolling walker (2 wheeled) Gait Pattern/deviations: Step-through pattern;Decreased stride length;Trunk flexed Gait velocity: decreased Gait velocity interpretation: Below normal speed for age/gender General Gait Details: verbal cues for improved posture. min guard  progressing to supervision for safety.   Stairs Stairs: Yes   Stair Management: One rail Right;Step to pattern;Forwards;Sideways Number of Stairs: 5 (x2) General stair comments: Pt practiced negotiating stairs both forwards as well as sideways. Pt reports he feels comfortable completing stairs either way.   Wheelchair Mobility    Modified Rankin (Stroke Patients Only)       Balance Overall balance assessment: Needs assistance Sitting-balance support: Feet supported;No upper extremity supported Sitting balance-Leahy Scale: Fair     Standing balance support: Bilateral upper extremity supported Standing balance-Leahy Scale: Poor Standing balance comment: Light UE support required.                             Cognition Arousal/Alertness: Awake/alert Behavior During Therapy: WFL for tasks assessed/performed Overall Cognitive Status: Within Functional Limits for tasks assessed                                        Exercises      General Comments        Pertinent Vitals/Pain Pain Assessment: Faces Faces Pain Scale: Hurts little more Pain Location: surgical site Pain Descriptors / Indicators: Discomfort;Operative site guarding Pain Intervention(s): Limited activity within patient's tolerance;Monitored during session;Repositioned    Home Living                      Prior Function            PT Goals (current goals can now be found in the care plan section) Acute Rehab PT Goals Patient Stated Goal: get back to work PT  Goal Formulation: With patient Time For Goal Achievement: 05/21/16 Potential to Achieve Goals: Fair Progress towards PT goals: Progressing toward goals    Frequency    Min 5X/week      PT Plan Discharge plan needs to be updated    Co-evaluation             End of Session Equipment Utilized During Treatment: Gait belt;Back brace Activity Tolerance: Patient tolerated treatment well Patient left:  in chair;with call bell/phone within reach;with nursing/sitter in room Nurse Communication: Mobility status PT Visit Diagnosis: Other abnormalities of gait and mobility (R26.89);Muscle weakness (generalized) (M62.81);Difficulty in walking, not elsewhere classified (R26.2)     Time: 8592-7639 PT Time Calculation (min) (ACUTE ONLY): 37 min  Charges:  $Gait Training: 23-37 mins                    G Codes:       Rolinda Roan, PT, DPT Acute Rehabilitation Services Pager: [phone]    Thelma Comp 05/08/2016, 8:37 AM

## 2016-05-08 NOTE — Discharge Summary (Signed)
Physician Discharge Summary  Patient ID: EBRAHIM DEREMER MRN: [id number] DOB/AGE: 1960-05-03 56 y.o.  Admit date: 05/07/2016 Discharge date: 05/08/2016  Admission Diagnoses:  Discharge Diagnoses:  Active Problems:   Lumbar stenosis with neurogenic claudication   Discharged Condition: good  Hospital Course: Patient admitted to hospital where he underwent an uncomplicated two-level lumbar decompression and fusion. Postoperatively he is doing well. Preoperative back and lower trimming pain improved. Ambulate without difficulty. Patient feels ready for discharge home.  Consults:   Significant Diagnostic Studies:   Treatments:   Discharge Exam: Blood pressure 121/69, pulse 93, temperature 98.4 F (36.9 C), resp. rate 18, SpO2 95 %. Awake and alert. Oriented and appropriate  Nerve function intact. Motor sensory function extremities normal. Wound clean and dry. Chest and abdomen benign.  Disposition: 01-Home or Self Care   Allergies as of 05/08/2016      Reactions   No Known Allergies       Medication List    TAKE these medications   amLODipine 10 MG tablet Commonly known as:  NORVASC Take 10 mg by mouth daily.   aspirin 81 MG tablet Take 81 mg by mouth daily.   diazepam 5 MG tablet Commonly known as:  VALIUM Take 1-2 tablets (5-10 mg total) by mouth every 6 (six) hours as needed for muscle spasms.   lansoprazole 30 MG capsule Commonly known as:  PREVACID Take 30 mg by mouth every morning.   lisinopril 40 MG tablet Commonly known as:  PRINIVIL,ZESTRIL Take 40 mg by mouth 2 (two) times daily.   metoprolol succinate 50 MG 24 hr tablet Commonly known as:  TOPROL-XL Take 50 mg by mouth daily. Take with or immediately following a meal.   oxyCODONE-acetaminophen 5-325 MG tablet Commonly known as:  ROXICET Take 1-2 tablets by mouth every 4 (four) hours as needed for severe pain.   testosterone cypionate 100 MG/ML injection Commonly known as:  DEPOTESTOTERONE  CYPIONATE Inject 100 mg into the muscle every 14 (fourteen) days. For IM use only            Durable Medical Equipment        Start     Ordered   05/07/16 1352  DME Walker rolling  Once    Question:  Patient needs a walker to treat with the following condition  Answer:  Lumbar stenosis with neurogenic claudication   05/07/16 1351   05/07/16 1352  DME 3 n 1  Once     05/07/16 1351       Signed: Edin Kon A 05/08/2016, 8:07 AM

## 2016-05-08 NOTE — Progress Notes (Signed)
D/C instructions reviewed with pt and his wife. Copy of instructions and scripts given to pt. Pt and wife did not want to wait for insurance to determine co-pay for bariatric 3 in1. Wife states she has a place she can go and get one. Pt agrees with his wife. Pt d/c'd via wheelchair with belongings, with corset brace on, escorted by unit NT.

## 2016-05-08 NOTE — Evaluation (Signed)
Occupational Therapy Evaluation/Discharge Patient Details Name: Mike Patel MRN: [id number] DOB: Jan 05, 1961 Today's Date: 05/08/2016    History of Present Illness 56 y.o. male s/p L2-3 and L3-4 fusion 05/07/16. PMH significant for GERD, HTN, Obesity (343 lbs) and sleep apnea (on CPAP). Past surgical history significant for L4-5 fusion.    Clinical Impression   PTA, pt was independent with ADL and functional mobility. He currently requires supervision for toilet transfers, min guard assist for shower transfers, and moderate assist for LB ADL. Pt reports that his wife has a sock aide and educated pt on method for use to improve independence with LB ADL. Additionally educated pt on compensatory methods to improve adherandce to back precautions during ADL including 2 cup method for grooming at sink and use of wet-wipes for hygeine, and 3-in-1 for toileting and shower transfers. Pt reports that his wife will be available for 24 hour assistance post-acute D/C. No further acute OT needs identified and OT will sign off.    Follow Up Recommendations  No OT follow up;Supervision/Assistance - 24 hour    Equipment Recommendations  3 in 1 bedside commode (wide)    Recommendations for Other Services       Precautions / Restrictions Precautions Precautions: Back;Fall Precaution Booklet Issued: Yes (comment) Precaution Comments: Pt able to verbalize 3/3 back precautions and adhere to these during ADL. Required Braces or Orthoses: Spinal Brace Spinal Brace: Lumbar corset;Applied in sitting position Restrictions Weight Bearing Restrictions: No      Mobility Bed Mobility               General bed mobility comments: Pt was received sitting up in the recliner.   Transfers Overall transfer level: Needs assistance Equipment used: Rolling walker (2 wheeled) Transfers: Sit to/from Stand Sit to Stand: Supervision         General transfer comment: Supervision for safety.    Balance  Overall balance assessment: Needs assistance Sitting-balance support: Feet supported;No upper extremity supported Sitting balance-Leahy Scale: Good     Standing balance support: Bilateral upper extremity supported Standing balance-Leahy Scale: Fair Standing balance comment: Able to statically stand during ADL without UE support.                           ADL either performed or assessed with clinical judgement   ADL Overall ADL's : Needs assistance/impaired Eating/Feeding: Set up;Sitting   Grooming: Supervision/safety;Sitting   Upper Body Bathing: Supervision/ safety;Sitting   Lower Body Bathing: Moderate assistance;Sit to/from stand   Upper Body Dressing : Supervision/safety;Sitting   Lower Body Dressing: Moderate assistance;Sit to/from stand   Toilet Transfer: Supervision/safety;Ambulation;RW;BSC (BSC over toilet)   Toileting- Clothing Manipulation and Hygiene: Min guard;Sit to/from stand;Adhering to back precautions   Tub/ Shower Transfer: Walk-in shower;Min guard;3 in 1;Ambulation   Functional mobility during ADLs: Rolling walker;Min guard General ADL Comments: Educated pt concerning use of sock aide for LB dressing, 2 cup method for oral care at sink, use of 3-in-1 over commode and as a shower seat, and safety post-acute D/C.     Vision Patient Visual Report: No change from baseline Vision Assessment?: No apparent visual deficits     Perception     Praxis      Pertinent Vitals/Pain Pain Assessment: 0-10 Pain Score: 7  Faces Pain Scale: Hurts little more Pain Location: surgical site Pain Descriptors / Indicators: Discomfort;Operative site guarding Pain Intervention(s): Monitored during session;Limited activity within patient's tolerance;Repositioned     Hand  Dominance Right   Extremity/Trunk Assessment Upper Extremity Assessment Upper Extremity Assessment: Overall WFL for tasks assessed   Lower Extremity Assessment Lower Extremity Assessment:  Generalized weakness   Cervical / Trunk Assessment Cervical / Trunk Assessment: Other exceptions Cervical / Trunk Exceptions: spinal surgery   Communication Communication Communication: No difficulties   Cognition Arousal/Alertness: Awake/alert Behavior During Therapy: WFL for tasks assessed/performed Overall Cognitive Status: Within Functional Limits for tasks assessed                                     General Comments       Exercises     Shoulder Instructions      Home Living Family/patient expects to be discharged to:: Private residence Living Arrangements: Spouse/significant other Available Help at Discharge: Family;Available 24 hours/day Type of Home: House Home Access: Stairs to enter [employer] of Steps: 3 Entrance Stairs-Rails: Right;Left Home Layout: One level     Bathroom Shower/Tub: Occupational psychologist: Standard Bathroom Accessibility: Yes How Accessible: Accessible via walker Home Equipment: [place] - 2 wheels;Adaptive equipment Adaptive Equipment: Sock aid Additional Comments: Pt lives with wife who can provide 24/7 assist.       Prior Functioning/Environment Level of Independence: Independent        Comments: pt was working as Administrator and doing yard work PTA        OT Problem List: Decreased strength;Decreased activity tolerance;Impaired balance (sitting and/or standing);Decreased knowledge of use of DME or AE;Pain      OT Treatment/Interventions:      OT Goals(Current goals can be found in the care plan section) Acute Rehab OT Goals Patient Stated Goal: get back to work OT Goal Formulation: With patient Time For Goal Achievement: 05/22/16 Potential to Achieve Goals: Good  OT Frequency:     Barriers to D/C:            Co-evaluation              End of Session Equipment Utilized During Treatment: Rolling walker;Back brace Nurse Communication: Mobility status  Activity Tolerance:  Patient tolerated treatment well Patient left: in chair;with call bell/phone within reach  OT Visit Diagnosis: Unsteadiness on feet (R26.81)                Time: 7026-3785 OT Time Calculation (min): 17 min Charges:  OT General Charges $OT Visit: 1 Procedure OT Evaluation $OT Eval Moderate Complexity: 1 Procedure G-Codes:     Norman Herrlich, MS OTR/L  Pager: [place] [facility] Sahej Schrieber 05/08/2016, 8:49 AM

## 2016-12-26 ENCOUNTER — Other Ambulatory Visit: Payer: Self-pay | Admitting: Specialist

## 2016-12-26 DIAGNOSIS — M25551 Pain in right hip: Secondary | ICD-10-CM

## 2016-12-29 ENCOUNTER — Ambulatory Visit
Admission: RE | Admit: 2016-12-29 | Discharge: 2016-12-29 | Disposition: A | Discharge status: Home / Self Care | Payer: BLUE CROSS/BLUE SHIELD | Source: Ambulatory Visit | Attending: Specialist | Admitting: Specialist

## 2016-12-29 DIAGNOSIS — M25551 Pain in right hip: Secondary | ICD-10-CM

## 2017-11-15 ENCOUNTER — Emergency Department: Payer: BLUE CROSS/BLUE SHIELD

## 2017-11-15 ENCOUNTER — Inpatient Hospital Stay
Admission: EM | Admit: 2017-11-15 | Discharge: 2017-11-19 | DRG: 439 | Disposition: A | Discharge status: Home / Self Care | Payer: BLUE CROSS/BLUE SHIELD | Attending: Internal Medicine | Admitting: Internal Medicine

## 2017-11-15 ENCOUNTER — Other Ambulatory Visit: Payer: Self-pay

## 2017-11-15 ENCOUNTER — Encounter: Payer: Self-pay | Admitting: Emergency Medicine

## 2017-11-15 DIAGNOSIS — R932 Abnormal findings on diagnostic imaging of liver and biliary tract: Secondary | ICD-10-CM

## 2017-11-15 DIAGNOSIS — G4733 Obstructive sleep apnea (adult) (pediatric): Secondary | ICD-10-CM | POA: Diagnosis present

## 2017-11-15 DIAGNOSIS — E669 Obesity, unspecified: Secondary | ICD-10-CM | POA: Diagnosis present

## 2017-11-15 DIAGNOSIS — Z7982 Long term (current) use of aspirin: Secondary | ICD-10-CM

## 2017-11-15 DIAGNOSIS — E119 Type 2 diabetes mellitus without complications: Secondary | ICD-10-CM | POA: Diagnosis present

## 2017-11-15 DIAGNOSIS — R1030 Lower abdominal pain, unspecified: Secondary | ICD-10-CM

## 2017-11-15 DIAGNOSIS — I10 Essential (primary) hypertension: Secondary | ICD-10-CM | POA: Diagnosis present

## 2017-11-15 DIAGNOSIS — K760 Fatty (change of) liver, not elsewhere classified: Secondary | ICD-10-CM | POA: Diagnosis present

## 2017-11-15 DIAGNOSIS — Z6841 Body Mass Index (BMI) 40.0 and over, adult: Secondary | ICD-10-CM | POA: Diagnosis not present

## 2017-11-15 DIAGNOSIS — Z79890 Hormone replacement therapy (postmenopausal): Secondary | ICD-10-CM | POA: Diagnosis not present

## 2017-11-15 DIAGNOSIS — E538 Deficiency of other specified B group vitamins: Secondary | ICD-10-CM | POA: Diagnosis present

## 2017-11-15 DIAGNOSIS — R748 Abnormal levels of other serum enzymes: Secondary | ICD-10-CM | POA: Diagnosis not present

## 2017-11-15 DIAGNOSIS — K851 Biliary acute pancreatitis without necrosis or infection: Secondary | ICD-10-CM | POA: Diagnosis present

## 2017-11-15 DIAGNOSIS — K859 Acute pancreatitis without necrosis or infection, unspecified: Secondary | ICD-10-CM

## 2017-11-15 DIAGNOSIS — K8050 Calculus of bile duct without cholangitis or cholecystitis without obstruction: Secondary | ICD-10-CM

## 2017-11-15 DIAGNOSIS — Z791 Long term (current) use of non-steroidal anti-inflammatories (NSAID): Secondary | ICD-10-CM | POA: Diagnosis not present

## 2017-11-15 DIAGNOSIS — K219 Gastro-esophageal reflux disease without esophagitis: Secondary | ICD-10-CM | POA: Diagnosis present

## 2017-11-15 DIAGNOSIS — K8070 Calculus of gallbladder and bile duct without cholecystitis without obstruction: Secondary | ICD-10-CM | POA: Diagnosis present

## 2017-11-15 LAB — COMPREHENSIVE METABOLIC PANEL
ALT: 280 U/L — AB (ref 0–44)
AST: 139 U/L — ABNORMAL HIGH (ref 15–41)
Albumin: 3.8 g/dL (ref 3.5–5.0)
Alkaline Phosphatase: 191 U/L — ABNORMAL HIGH (ref 38–126)
Anion gap: 11 (ref 5–15)
BUN: 17 mg/dL (ref 6–20)
CHLORIDE: 101 mmol/L (ref 98–111)
CO2: 23 mmol/L (ref 22–32)
CREATININE: 0.79 mg/dL (ref 0.61–1.24)
Calcium: 8.7 mg/dL — ABNORMAL LOW (ref 8.9–10.3)
Glucose, Bld: 214 mg/dL — ABNORMAL HIGH (ref 70–99)
Potassium: 4 mmol/L (ref 3.5–5.1)
Sodium: 135 mmol/L (ref 135–145)
Total Bilirubin: 5 mg/dL — ABNORMAL HIGH (ref 0.3–1.2)
Total Protein: 7.5 g/dL (ref 6.5–8.1)

## 2017-11-15 LAB — CBC
HEMATOCRIT: 48.5 % (ref 39.0–52.0)
Hemoglobin: 16.1 g/dL (ref 13.0–17.0)
MCH: 30.2 pg (ref 26.0–34.0)
MCHC: 33.2 g/dL (ref 30.0–36.0)
MCV: 91 fL (ref 80.0–100.0)
NRBC: 0 % (ref 0.0–0.2)
Platelets: 315 10*3/uL (ref 150–400)
RBC: 5.33 MIL/uL (ref 4.22–5.81)
RDW: 13.2 % (ref 11.5–15.5)
WBC: 9.6 10*3/uL (ref 4.0–10.5)

## 2017-11-15 LAB — URINALYSIS, COMPLETE (UACMP) WITH MICROSCOPIC
BACTERIA UA: NONE SEEN
Glucose, UA: >=500 mg/dL — AB
Hgb urine dipstick: NEGATIVE
KETONES UR: 5 mg/dL — AB
LEUKOCYTES UA: NEGATIVE
Nitrite: NEGATIVE
PH: 5 (ref 5.0–8.0)
PROTEIN: 100 mg/dL — AB
Specific Gravity, Urine: 1.027 (ref 1.005–1.030)

## 2017-11-15 LAB — CK: CK TOTAL: 257 U/L (ref 49–397)

## 2017-11-15 LAB — TROPONIN I

## 2017-11-15 LAB — LIPASE, BLOOD: LIPASE: 1084 U/L — AB (ref 11–51)

## 2017-11-15 MED ORDER — SODIUM CHLORIDE 0.9 % IV BOLUS
1000.0000 mL | Freq: Once | INTRAVENOUS | Status: AC
  Administered 2017-11-15: 1000 mL via INTRAVENOUS

## 2017-11-15 MED ORDER — MORPHINE SULFATE (PF) 4 MG/ML IV SOLN
4.0000 mg | Freq: Once | INTRAVENOUS | Status: AC
  Administered 2017-11-15: 4 mg via INTRAVENOUS
  Filled 2017-11-15: qty 1

## 2017-11-15 MED ORDER — ONDANSETRON HCL 4 MG/2ML IJ SOLN
4.0000 mg | Freq: Once | INTRAMUSCULAR | Status: AC
  Administered 2017-11-15: 4 mg via INTRAVENOUS
  Filled 2017-11-15: qty 2

## 2017-11-15 MED ORDER — SODIUM CHLORIDE 0.9 % IV BOLUS: 1000.0000 mL | Freq: Once | INTRAVENOUS | Status: DC

## 2017-11-15 NOTE — ED Triage Notes (Signed)
Pt to ED c/o epigastric pain x2-3 days that is like heavy pressure, nausea, diaphoretic, and states urine has been very dark color.

## 2017-11-15 NOTE — ED Provider Notes (Signed)
[facility] Emergency Department Provider Note  ___________________________________________   First MD Initiated Contact with Patient 11/15/17 2011     (approximate)  I have reviewed the triage vital signs and the nursing notes.   HISTORY  Chief Complaint Abdominal Pain   HPI Mike Patel is a 57 y.o. male with a history of obesity as well as GERD and hypertension was presenting with 2 to 3 days of epigastric and right upper quadrant abdominal pain.  He describes as a pressure.  Says that he has had decreased appetite.  No vomiting.  Also with amber-colored urine.  Says that he has been having shaking chills as well on and off over the past week.   Past Medical History:  Diagnosis Date  . Arthritis   . GERD (gastroesophageal reflux disease)   . Hypertension   . Obesity   . Sleep apnea    cpap    Patient Active Problem List   Diagnosis Date Noted  . Lumbar stenosis with neurogenic claudication 05/07/2016  . OSA on CPAP 09/01/2013  . HTN (hypertension) 01/28/2012  . Obesity 01/28/2012    Past Surgical History:  Procedure Laterality Date  . BACK SURGERY     x2  . COLONOSCOPY N/A 05/24/2014   Procedure: COLONOSCOPY;  Surgeon: Hulen Luster, MD;  Location: [facility] ENDOSCOPY;  Service: Gastroenterology;  Laterality: N/A;  . ELBOW SURGERY Right    tennis elbow  . ESOPHAGOGASTRODUODENOSCOPY N/A 05/24/2014   Procedure: ESOPHAGOGASTRODUODENOSCOPY (EGD);  Surgeon: Hulen Luster, MD;  Location: [facility] ENDOSCOPY;  Service: Gastroenterology;  Laterality: N/A;  . PROSTATE SURGERY    . SPINE SURGERY     l-spine x 2    Prior to Admission medications   Medication Sig Start Date End Date Taking? Authorizing Provider  amLODipine (NORVASC) 10 MG tablet Take 10 mg by mouth daily.    [provider]  aspirin 81 MG tablet Take 81 mg by mouth daily.    [provider]  diazepam (VALIUM) 5 MG tablet Take 1-2 tablets (5-10 mg total) by mouth every 6 (six)  hours as needed for muscle spasms. 05/08/16   Earnie Larsson, MD  lansoprazole (PREVACID) 30 MG capsule Take 30 mg by mouth every morning.     [provider]  lisinopril (PRINIVIL,ZESTRIL) 40 MG tablet Take 40 mg by mouth 2 (two) times daily.    [provider]  metoprolol succinate (TOPROL-XL) 50 MG 24 hr tablet Take 50 mg by mouth daily. Take with or immediately following a meal.    [provider]  oxyCODONE-acetaminophen (ROXICET) 5-325 MG tablet Take 1-2 tablets by mouth every 4 (four) hours as needed for severe pain. 05/08/16   Earnie Larsson, MD  testosterone cypionate (DEPOTESTOTERONE CYPIONATE) 100 MG/ML injection Inject 100 mg into the muscle every 14 (fourteen) days. For IM use only    [provider]    Allergies No known allergies  History reviewed. No pertinent family history.  Social History Social History   Tobacco Use  . Smoking status: Never Smoker  . Smokeless tobacco: Never Used  Substance Use Topics  . Alcohol use: No  . Drug use: No    Review of Systems  Constitutional: Chills at home Eyes: No visual changes. ENT: No sore throat. Cardiovascular: Denies chest pain. Respiratory: Denies shortness of breath. Gastrointestinal:no nausea, no vomiting.  No diarrhea.  No constipation. Genitourinary: Negative for dysuria. Musculoskeletal: Negative for back pain. Skin: Negative for rash. Neurological: Negative for headaches,  focal weakness or numbness.   ____________________________________________   PHYSICAL EXAM:  VITAL SIGNS: ED Triage Vitals [11/15/17 1948]  Enc Vitals Group     BP (!) 163/94     Pulse Rate (!) 104     Resp 18     Temp 99 F (37.2 C)     Temp Source Oral     SpO2 96 %     Weight (!) 332 lb (150.6 kg)     Height 5\' 9"  (1.753 m)     Head Circumference      Peak Flow      Pain Score 7     Pain Loc      Pain Edu?      Excl. in [place]?     Constitutional: Alert and oriented. Well appearing and in no  acute distress. Eyes: Conjunctivae are normal.  Head: Atraumatic. Nose: No congestion/rhinnorhea. Mouth/Throat: Mucous membranes are moist.  Neck: No stridor.   Cardiovascular: Normal rate, regular rhythm. Grossly normal heart sounds.  Respiratory: Normal respiratory effort.  No retractions. Lungs CTAB. Gastrointestinal: Soft with mild epigastric and right upper quadrant tenderness to palpation with a negative Murphy sign.  No CVA tenderness to palpation. Musculoskeletal: No lower extremity tenderness nor edema.  No joint effusions. Neurologic:  Normal speech and language. No gross focal neurologic deficits are appreciated. Skin:  Skin is warm, dry and intact. No rash noted. Psychiatric: Mood and affect are normal. Speech and behavior are normal.  ____________________________________________   LABS (all labs ordered are listed, but only abnormal results are displayed)  Labs Reviewed  LIPASE, BLOOD - Abnormal; Notable for the following components:      Result Value   Lipase 1,084 (*)    All other components within normal limits  COMPREHENSIVE METABOLIC PANEL - Abnormal; Notable for the following components:   Glucose, Bld 214 (*)    Calcium 8.7 (*)    AST 139 (*)    ALT 280 (*)    Alkaline Phosphatase 191 (*)    Total Bilirubin 5.0 (*)    All other components within normal limits  URINALYSIS, COMPLETE (UACMP) WITH MICROSCOPIC - Abnormal; Notable for the following components:   Color, Urine AMBER (*)    APPearance CLEAR (*)    Glucose, UA >=500 (*)    Bilirubin Urine MODERATE (*)    Ketones, ur 5 (*)    Protein, ur 100 (*)    All other components within normal limits  CBC  TROPONIN I  CK   ____________________________________________  EKG  ED ECG REPORT I, Doran Stabler, the attending physician, personally viewed and interpreted this ECG.   Date: 11/15/2017  EKG Time: 2002  Rate: 103  Rhythm: sinus tachycardia  Axis: normal  Intervals:none  ST&T Change: No  ST segment elevation or depression.  No abnormal T wave inversion.  ____________________________________________  RADIOLOGY  Ultrasound of the right upper quadrant without gallstones.  Positive for fatty liver. ____________________________________________   PROCEDURES  Procedure(s) performed:   Procedures  Critical Care performed:   ____________________________________________   INITIAL IMPRESSION / ASSESSMENT AND PLAN / ED COURSE  Pertinent labs & imaging results that were available during my care of the patient were reviewed by me and considered in my medical decision making (see chart for details).  Differential diagnosis includes, but is not limited to, biliary disease (biliary colic, acute cholecystitis, cholangitis, choledocholithiasis, etc), intrathoracic causes for epigastric abdominal pain including ACS, gastritis, duodenitis, pancreatitis, small bowel or large bowel obstruction, abdominal  aortic aneurysm, hernia, and ulcer(s). As part of my medical decision making, I reviewed the following data within the electronic MEDICAL RECORD NUMBER Notes from prior outpatient visits  ----------------------------------------- 10:13 PM on 11/15/2017 -----------------------------------------  Patient with pancreatitis.  Possibly gallstone pancreatitis secondary to elevated LFTs.  Discussed case with Dr. Marius Ditch who recommends an MRCP and admission to the hospital.  Does not recommend transfer at this time because of no gallstones found in the gallbladder.  Recommends fluids and pain control and she will reassess in the morning.  Signed out the hospitalist.  Patient as well as family understanding the diagnosis as well as treatment and willing to comply. ____________________________________________   FINAL CLINICAL IMPRESSION(S) / ED DIAGNOSES  Pancreatitis.  Hyperbilirubinemia.  NEW MEDICATIONS STARTED DURING THIS VISIT:  New Prescriptions   No medications on file     Note:  This  document was prepared using Dragon voice recognition software and may include unintentional dictation errors.     Orbie Pyo, MD 11/15/17 2214

## 2017-11-15 NOTE — ED Notes (Signed)
Pt at this time has spoken to MRI VIA PHONE , for pre screening

## 2017-11-15 NOTE — H&P (Addendum)
[name]    MR#:  [id number]  DATE OF BIRTH:  24-Jan-1960  DATE OF ADMISSION:  11/15/2017  PRIMARY CARE PHYSICIAN: Mike Pink, MD   REQUESTING/REFERRING PHYSICIAN:   CHIEF COMPLAINT:   Chief Complaint  Patient presents with  . Abdominal Pain    HISTORY OF PRESENT ILLNESS: Mike Patel  is a 57 y.o. male with a known history of hypertension, obesity, sleep apnea. Patient presented to emergency room for acute onset of severe epigastric and right upper quadrant pain going on for the past 2 to 3 days, worse after meals, gradually getting worse.  She patient describes the pain as pressure, without radiation.  He also noted his urine has had dark, beer color in the past 3 days.  Denies fever/chills, nausea, vomiting, chest pain, diarrhea, bleeding. Blood test done emergency room are notable for elevated lipase level at 1000, elevated total bilirubin level at 5.  AST is 139, ALT is 280, alk phos is 191. UA shows elevated bilirubin. Abdominal ultrasound shows fatty liver but no gallstones. She is admitted for further evaluation and treatment.   PAST MEDICAL HISTORY:   Past Medical History:  Diagnosis Date  . Arthritis   . GERD (gastroesophageal reflux disease)   . Hypertension   . Obesity   . Sleep apnea    cpap    PAST SURGICAL HISTORY:  Past Surgical History:  Procedure Laterality Date  . BACK SURGERY     x2  . COLONOSCOPY N/A 05/24/2014   Procedure: COLONOSCOPY;  Surgeon: Hulen Luster, MD;  Location: [facility] ENDOSCOPY;  Service: Gastroenterology;  Laterality: N/A;  . ELBOW SURGERY Right    tennis elbow  . ESOPHAGOGASTRODUODENOSCOPY N/A 05/24/2014   Procedure: ESOPHAGOGASTRODUODENOSCOPY (EGD);  Surgeon: Hulen Luster, MD;  Location: [facility] ENDOSCOPY;  Service: Gastroenterology;  Laterality: N/A;  . PROSTATE SURGERY    . SPINE SURGERY     l-spine x 2    SOCIAL HISTORY:  Social History   Tobacco Use  .  Smoking status: Never Smoker  . Smokeless tobacco: Never Used  Substance Use Topics  . Alcohol use: No    FAMILY HISTORY: History reviewed. No pertinent family history.  DRUG ALLERGIES:  Allergies  Allergen Reactions  . No Known Allergies     REVIEW OF SYSTEMS:   CONSTITUTIONAL: No fever, but positive for fatigue and generalized weakness.  EYES: No changes in vision.  EARS, NOSE, AND THROAT: No tinnitus or ear pain.  RESPIRATORY: No cough, shortness of breath, wheezing or hemoptysis.  CARDIOVASCULAR: No chest pain, orthopnea, edema.  GASTROINTESTINAL: Positive for epigastric abdominal pain.  No nausea, vomiting, diarrhea.  GENITOURINARY: No dysuria, hematuria.  ENDOCRINE: No polyuria, nocturia. HEMATOLOGY: No bleeding. SKIN: No rash or lesion. MUSCULOSKELETAL: No joint pain at this time.   NEUROLOGIC: No focal weakness.  PSYCHIATRY: No anxiety or depression.   MEDICATIONS AT HOME:  Prior to Admission medications   Medication Sig Start Date End Date Taking? Authorizing Provider  amLODipine (NORVASC) 10 MG tablet Take 10 mg by mouth daily.   Yes [provider]  aspirin 81 MG tablet Take 81 mg by mouth daily.   Yes [provider]  cyanocobalamin (,VITAMIN B-12,) 1000 MCG/ML injection Inject 1 mL into the muscle every 30 (thirty) days. 10/25/17  Yes [provider]  lansoprazole (PREVACID) 30 MG capsule Take 30 mg by mouth every morning.    Yes [provider]  lisinopril (PRINIVIL,ZESTRIL) 40 MG tablet Take 40 mg by mouth 2 (two) times daily.   Yes [provider]  meloxicam (MOBIC) 15 MG tablet Take 15 mg by mouth daily. 08/27/17  Yes [provider]  metoprolol succinate (TOPROL-XL) 50 MG 24 hr tablet Take 50 mg by mouth daily. Take with or immediately following a meal.   Yes [provider]  tadalafil (CIALIS) 5 MG tablet Take 1 tablet by mouth as needed. 11/05/17  Yes [provider]  testosterone  cypionate (DEPOTESTOSTERONE CYPIONATE) 200 MG/ML injection Inject 1 mL into the muscle every 14 (fourteen) days. 10/25/17  Yes [provider]  diazepam (VALIUM) 5 MG tablet Take 1-2 tablets (5-10 mg total) by mouth every 6 (six) hours as needed for muscle spasms. Patient not taking: Reported on 11/15/2017 05/08/16   Earnie Larsson, MD  oseltamivir (TAMIFLU) 75 MG capsule Take 1 capsule by mouth 2 (two) times daily. 11/07/17   [provider]  oxyCODONE-acetaminophen (ROXICET) 5-325 MG tablet Take 1-2 tablets by mouth every 4 (four) hours as needed for severe pain. Patient not taking: Reported on 11/15/2017 05/08/16   Earnie Larsson, MD      PHYSICAL EXAMINATION:   VITAL SIGNS: Blood pressure (!) 159/80, pulse (!) 102, temperature 99 F (37.2 C), temperature source Oral, resp. rate 18, height 5' 9" (1.753 m), weight (!) 150.6 kg, SpO2 97 %.  GENERAL:  57 y.o.-year-old patient lying in the bed with no acute distress, status post pain medication emergency room.  EYES: Pupils equal, round, reactive to light and accommodation.  Bilateral scleral icterus noted. Extraocular muscles intact.  HEENT: Head atraumatic, normocephalic. Oropharynx and nasopharynx clear.  NECK:  Supple, no jugular venous distention. No thyroid enlargement, no tenderness.  LUNGS: Normal breath sounds bilaterally, no wheezing, rales,rhonchi or crepitation. No use of accessory muscles of respiration.  CARDIOVASCULAR: S1, S2 normal. No S3/S4.  ABDOMEN: There is mild tenderness with palpation in epigastric area, no guarding, rebound.  Otherwise, the abdomen is obese, soft, nondistended. Bowel sounds present. No organomegaly or mass.  EXTREMITIES: No pedal edema, cyanosis, or clubbing.  NEUROLOGIC: Cranial nerves II through XII are intact. Muscle strength 5/5 in all extremities. Sensation intact.   PSYCHIATRIC: The patient is alert and oriented x 3.  SKIN: Positive for jaundice.  No obvious rash, lesion, or ulcer.    LABORATORY PANEL:   CBC Recent Labs  Lab 11/15/17 1954  WBC 9.6  HGB 16.1  HCT 48.5  PLT 315  MCV 91.0  MCH 30.2  MCHC 33.2  RDW 13.2   ------------------------------------------------------------------------------------------------------------------  Chemistries  Recent Labs  Lab 11/15/17 1954  NA 135  K 4.0  CL 101  CO2 23  GLUCOSE 214*  BUN 17  CREATININE 0.79  CALCIUM 8.7*  AST 139*  ALT 280*  ALKPHOS 191*  BILITOT 5.0*   ------------------------------------------------------------------------------------------------------------------ estimated creatinine clearance is 146.2 mL/min (by C-G formula based on SCr of 0.79 mg/dL). ------------------------------------------------------------------------------------------------------------------ No results for input(s): TSH, T4TOTAL, T3FREE, THYROIDAB in the last 72 hours.  Invalid input(s): FREET3   Coagulation profile No results for input(s): INR, PROTIME in the last 168 hours. ------------------------------------------------------------------------------------------------------------------- No results for input(s): DDIMER in the last 72 hours. -------------------------------------------------------------------------------------------------------------------  Cardiac Enzymes Recent Labs  Lab 11/15/17 1954  TROPONINI <0.03   ------------------------------------------------------------------------------------------------------------------ Invalid input(s): POCBNP  ---------------------------------------------------------------------------------------------------------------  Urinalysis    Component Value Date/Time   COLORURINE AMBER (A) 11/15/2017 1954   APPEARANCEUR CLEAR (A) 11/15/2017 1954   LABSPEC 1.027 11/15/2017 1954  PHURINE 5.0 11/15/2017 1954   GLUCOSEU >=500 (A) 11/15/2017 1954   HGBUR NEGATIVE 11/15/2017 1954   BILIRUBINUR MODERATE (A) 11/15/2017 1954   KETONESUR 5 (A) 11/15/2017 1954    PROTEINUR 100 (A) 11/15/2017 1954   NITRITE NEGATIVE 11/15/2017 1954   LEUKOCYTESUR NEGATIVE 11/15/2017 1954     RADIOLOGY: US Abdomen Limited Ruq  Result Date: 11/15/2017 CLINICAL DATA:  57 year old male with right upper quadrant abdominal pain and elevated bilirubin. EXAM: ULTRASOUND ABDOMEN LIMITED RIGHT UPPER QUADRANT COMPARISON:  CT of the abdomen pelvis dated 05/10/2014 FINDINGS: Gallbladder: The gallbladder is partially contracted. There is no gallstone, or pericholecystic fluid. Negative sonographic Murphy's sign. The gallbladder wall measures approximately 5 mm in thickness, likely secondary to underdistention. Common bile duct: Diameter: 4 mm Liver: There is diffuse increased liver echogenicity most consistent with fatty infiltration. There is a 2.0 x 3.0 x 2.5 cm hypoechoic area adjacent to the gallbladder most consistent with a focal area of fat sparing. Portal vein is patent on color Doppler imaging with normal direction of blood flow towards the liver. IMPRESSION: 1. No gallstone. 2. Fatty liver. Electronically Signed   By: Anner Crete M.D.   On: 11/15/2017 21:36    EKG: Orders placed or performed during the hospital encounter of 11/15/17  . ED EKG  . ED EKG    IMPRESSION AND PLAN:  1.  Acute pancreatitis.  We will start treatment with IV fluids and pain medications.  Keep patient n.p.o. for now. 2.  Hyperbilirubinemia and elevated liver enzymes levels.  We will check MRCP for further evaluation of biliary tract.  Gastroenterology is consulted. 3.  Hypertension, will resume home medications.    All the records are reviewed and case discussed with ED provider. Management plans discussed with the patient, family and they are in agreement.  CODE STATUS: Full Code Status History    Date Active Date Inactive Code Status Order ID Comments User Context   05/07/2016 1351 05/08/2016 1657 Full Code [id number]  Earnie Larsson, MD Inpatient       TOTAL TIME TAKING CARE OF  THIS PATIENT: 55 minutes.    Amelia Jo M.D on 11/15/2017 at 11:45 PM  Between 7am to 6pm - Pager - [phone]  After 6pm go to [email or web address] - password [email or web address] [facility] [place]  [phone]  CC: Primary care physician; Mike Pink, MD

## 2017-11-15 NOTE — ED Notes (Signed)
Patient states abdominal pain and urine "looks like it's tea". Had flu last week and took Tamiflu

## 2017-11-16 DIAGNOSIS — K851 Biliary acute pancreatitis without necrosis or infection: Principal | ICD-10-CM

## 2017-11-16 DIAGNOSIS — K8050 Calculus of bile duct without cholangitis or cholecystitis without obstruction: Secondary | ICD-10-CM

## 2017-11-16 LAB — GLUCOSE, CAPILLARY
GLUCOSE-CAPILLARY: 134 mg/dL — AB (ref 70–99)
GLUCOSE-CAPILLARY: 121 mg/dL — AB (ref 70–99)
GLUCOSE-CAPILLARY: 104 mg/dL — AB (ref 70–99)
Glucose-Capillary: 108 mg/dL — ABNORMAL HIGH (ref 70–99)

## 2017-11-16 LAB — COMPREHENSIVE METABOLIC PANEL
ALBUMIN: 3.3 g/dL — AB (ref 3.5–5.0)
ALT: 229 U/L — ABNORMAL HIGH (ref 0–44)
AST: 98 U/L — AB (ref 15–41)
Alkaline Phosphatase: 178 U/L — ABNORMAL HIGH (ref 38–126)
Anion gap: 10 (ref 5–15)
BILIRUBIN TOTAL: 4.8 mg/dL — AB (ref 0.3–1.2)
BUN: 11 mg/dL (ref 6–20)
CHLORIDE: 103 mmol/L (ref 98–111)
CO2: 24 mmol/L (ref 22–32)
Calcium: 8.1 mg/dL — ABNORMAL LOW (ref 8.9–10.3)
Creatinine, Ser: 0.54 mg/dL — ABNORMAL LOW (ref 0.61–1.24)
GFR calc Af Amer: >60 mL/min (ref >60)
GFR calc non Af Amer: >60 mL/min (ref >60)
GLUCOSE: 134 mg/dL — AB (ref 70–99)
POTASSIUM: 3.8 mmol/L (ref 3.5–5.1)
Sodium: 137 mmol/L (ref 135–145)
Total Protein: 6.8 g/dL (ref 6.5–8.1)

## 2017-11-16 LAB — CBC
HEMATOCRIT: 44.4 % (ref 39.0–52.0)
HEMOGLOBIN: 14.5 g/dL (ref 13.0–17.0)
MCH: 30.4 pg (ref 26.0–34.0)
MCHC: 32.7 g/dL (ref 30.0–36.0)
MCV: 93.1 fL (ref 80.0–100.0)
Platelets: 298 10*3/uL (ref 150–400)
RBC: 4.77 MIL/uL (ref 4.22–5.81)
RDW: 13.4 % (ref 11.5–15.5)
WBC: 8.9 10*3/uL (ref 4.0–10.5)
nRBC: 0 % (ref 0.0–0.2)

## 2017-11-16 LAB — BILIRUBIN, DIRECT: Bilirubin, Direct: 3.1 mg/dL — ABNORMAL HIGH (ref 0.0–0.2)

## 2017-11-16 MED ORDER — HYDROMORPHONE HCL 1 MG/ML IJ SOLN: 0.5000 mg | INTRAMUSCULAR | Status: DC | PRN

## 2017-11-16 MED ORDER — HEPARIN SODIUM (PORCINE) 5000 UNIT/ML IJ SOLN
5000.0000 [IU] | Freq: Three times a day (TID) | INTRAMUSCULAR | Status: DC
  Administered 2017-11-16 – 2017-11-19 (×10): 5000 [IU] via SUBCUTANEOUS
  Filled 2017-11-16 (×10): qty 1

## 2017-11-16 MED ORDER — TESTOSTERONE CYPIONATE 200 MG/ML IM SOLN
200.0000 mg | Freq: Once | INTRAMUSCULAR | Status: AC
  Administered 2017-11-17: 200 mg via INTRAMUSCULAR
  Filled 2017-11-16: qty 1

## 2017-11-16 MED ORDER — BISACODYL 5 MG PO TBEC: 5.0000 mg | DELAYED_RELEASE_TABLET | Freq: Every day | ORAL | Status: DC | PRN

## 2017-11-16 MED ORDER — HYDROCODONE-ACETAMINOPHEN 5-325 MG PO TABS
1.0000 | ORAL_TABLET | ORAL | Status: DC | PRN
  Administered 2017-11-16: 1 via ORAL
  Filled 2017-11-16: qty 1

## 2017-11-16 MED ORDER — ONDANSETRON HCL 4 MG/2ML IJ SOLN: 4.0000 mg | Freq: Four times a day (QID) | INTRAMUSCULAR | Status: DC | PRN

## 2017-11-16 MED ORDER — DOCUSATE SODIUM 100 MG PO CAPS
100.0000 mg | ORAL_CAPSULE | Freq: Two times a day (BID) | ORAL | Status: DC
  Administered 2017-11-16 – 2017-11-18 (×6): 100 mg via ORAL
  Filled 2017-11-16 (×7): qty 1

## 2017-11-16 MED ORDER — ACETAMINOPHEN 325 MG PO TABS
650.0000 mg | ORAL_TABLET | Freq: Four times a day (QID) | ORAL | Status: DC | PRN
  Administered 2017-11-17 – 2017-11-18 (×3): 650 mg via ORAL
  Filled 2017-11-16 (×3): qty 2

## 2017-11-16 MED ORDER — SODIUM CHLORIDE 0.9 % IV SOLN
INTRAVENOUS | Status: DC
  Administered 2017-11-16 – 2017-11-19 (×9): via INTRAVENOUS

## 2017-11-16 MED ORDER — PANTOPRAZOLE SODIUM 40 MG PO TBEC
40.0000 mg | DELAYED_RELEASE_TABLET | Freq: Every day | ORAL | Status: DC
  Administered 2017-11-16 – 2017-11-18 (×3): 40 mg via ORAL
  Filled 2017-11-16 (×3): qty 1

## 2017-11-16 MED ORDER — HYDROMORPHONE HCL 1 MG/ML IJ SOLN: 1.0000 mg | INTRAMUSCULAR | Status: DC | PRN

## 2017-11-16 MED ORDER — GADOBUTROL 1 MMOL/ML IV SOLN
10.0000 mL | Freq: Once | INTRAVENOUS | Status: AC | PRN
  Administered 2017-11-16: 10 mL via INTRAVENOUS

## 2017-11-16 MED ORDER — INSULIN ASPART 100 UNIT/ML ~~LOC~~ SOLN: 0.0000 [IU] | Freq: Every day | SUBCUTANEOUS | Status: DC

## 2017-11-16 MED ORDER — CYANOCOBALAMIN 1000 MCG/ML IJ SOLN: 1000.0000 ug | INTRAMUSCULAR | Status: DC

## 2017-11-16 MED ORDER — AMLODIPINE BESYLATE 10 MG PO TABS
10.0000 mg | ORAL_TABLET | Freq: Every day | ORAL | Status: DC
  Administered 2017-11-16 – 2017-11-18 (×3): 10 mg via ORAL
  Filled 2017-11-16 (×3): qty 1

## 2017-11-16 MED ORDER — ACETAMINOPHEN 650 MG RE SUPP: 650.0000 mg | Freq: Four times a day (QID) | RECTAL | Status: DC | PRN

## 2017-11-16 MED ORDER — METOPROLOL SUCCINATE ER 50 MG PO TB24
50.0000 mg | ORAL_TABLET | Freq: Every day | ORAL | Status: DC
  Administered 2017-11-16 – 2017-11-18 (×3): 50 mg via ORAL
  Filled 2017-11-16 (×3): qty 1

## 2017-11-16 MED ORDER — INSULIN ASPART 100 UNIT/ML ~~LOC~~ SOLN
0.0000 [IU] | Freq: Three times a day (TID) | SUBCUTANEOUS | Status: DC
  Administered 2017-11-17: 3 [IU] via SUBCUTANEOUS
  Administered 2017-11-18: 5 [IU] via SUBCUTANEOUS
  Administered 2017-11-18: 2 [IU] via SUBCUTANEOUS
  Administered 2017-11-18: 3 [IU] via SUBCUTANEOUS
  Filled 2017-11-16 (×4): qty 1

## 2017-11-16 MED ORDER — ONDANSETRON HCL 4 MG PO TABS: 4.0000 mg | ORAL_TABLET | Freq: Four times a day (QID) | ORAL | Status: DC | PRN

## 2017-11-16 NOTE — Consult Note (Addendum)
   Mike R Vanga, MD [address]  [address]  [place], [place] [address]  Main: [phone]  Fax: [phone] Pager: [phone]   Consultation  Referring Provider:     No ref. provider found Primary Care Physician:  Hedrick, James, MD Primary Gastroenterologist: None         Reason for Consultation:     Gallstone pancreatitis  Date of Admission:  11/15/2017 Date of Consultation:  11/16/2017         HPI:   Mike Patel is a 57 y.o. Caucasian male with metabolic syndrome, who presents with 3 days of epigastric pain associated with nausea which has been progressively getting worse.  Patient reports that he had flu last week and was treated with Tamiflu and his symptoms resolved.  On Wednesday, he had tacos.  Started experiencing epigastric pain since Thursday.  He is recently diagnosed with diabetes, has been trying to eat healthy, lost about 10 to 15 pounds in the last 2 months.  He is currently not on any medications for diabetes. When he presented to ER yesterday, CBC normal, AST 139, ALT 280, alkaline phosphatase 191, total bilirubin 5, lipase 1084.  Ultrasound abdomen revealed fatty liver, no evidence of gallstones, normal caliber CBD.  Due to elevated bilirubin, patient underwent MRCP which revealed cholelithiasis, 2 filling defects in the mid common bile duct measuring up to 7 mm consistent with CBD stones.  Patient is kept n.p.o., started on IV fluids and GI is consulted for further evaluation.  When I interviewed patient today, he reports that pain has gotten better.  His mouth is dry and would like to have some ice chips.  He denies nausea.  He denies fever, chills since admission.  He does report early morning severe sweats for the last few months  NSAIDs: None  Antiplts/Anticoagulants/Anti thrombotics: None  GI Procedures: EGD and colonoscopy in 05/2014 by Dr Oh Normal  Past Medical History:  Diagnosis Date  . Arthritis   . GERD (gastroesophageal reflux  disease)   . Hypertension   . Obesity   . Sleep apnea    cpap    Past Surgical History:  Procedure Laterality Date  . BACK SURGERY     x2  . COLONOSCOPY N/A 05/24/2014   Procedure: COLONOSCOPY;  Surgeon: Paul Y Oh, MD;  Location: [facility] ENDOSCOPY;  Service: Gastroenterology;  Laterality: N/A;  . ELBOW SURGERY Right    tennis elbow  . ESOPHAGOGASTRODUODENOSCOPY N/A 05/24/2014   Procedure: ESOPHAGOGASTRODUODENOSCOPY (EGD);  Surgeon: Paul Y Oh, MD;  Location: [facility] ENDOSCOPY;  Service: Gastroenterology;  Laterality: N/A;  . PROSTATE SURGERY    . SPINE SURGERY     l-spine x 2    Prior to Admission medications   Medication Sig Start Date End Date Taking? Authorizing Provider  amLODipine (NORVASC) 10 MG tablet Take 10 mg by mouth daily.   Yes [provider]  aspirin 81 MG tablet Take 81 mg by mouth daily.   Yes [provider]  cyanocobalamin (,VITAMIN B-12,) 1000 MCG/ML injection Inject 1 mL into the muscle every 30 (thirty) days. 10/25/17  Yes [provider]  lansoprazole (PREVACID) 30 MG capsule Take 30 mg by mouth every morning.    Yes [provider]  lisinopril (PRINIVIL,ZESTRIL) 40 MG tablet Take 40 mg by mouth 2 (two) times daily.   Yes [provider]  meloxicam (MOBIC) 15 MG tablet Take 15 mg by mouth daily. 08/27/17  Yes [provider]  metoprolol succinate (  TOPROL-XL) 50 MG 24 hr tablet Take 50 mg by mouth daily. Take with or immediately following a meal.   Yes [provider]  tadalafil (CIALIS) 5 MG tablet Take 1 tablet by mouth as needed. 11/05/17  Yes [provider]  testosterone cypionate (DEPOTESTOSTERONE CYPIONATE) 200 MG/ML injection Inject 1 mL into the muscle every 14 (fourteen) days. 10/25/17  Yes [provider]  diazepam (VALIUM) 5 MG tablet Take 1-2 tablets (5-10 mg total) by mouth every 6 (six) hours as needed for muscle spasms. Patient not taking: Reported on 11/15/2017 05/08/16   Earnie Larsson, MD  oseltamivir (TAMIFLU) 75 MG capsule Take 1 capsule by mouth 2 (two) times daily. 11/07/17   [provider]  oxyCODONE-acetaminophen (ROXICET) 5-325 MG tablet Take 1-2 tablets by mouth every 4 (four) hours as needed for severe pain. Patient not taking: Reported on 11/15/2017 05/08/16   Earnie Larsson, MD    Current Facility-Administered Medications:  .  0.9 %  sodium chloride infusion, , Intravenous, Continuous, Mody, Sital, MD, Last Rate: 150 mL/hr at 11/16/17 1252 .  acetaminophen (TYLENOL) tablet 650 mg, 650 mg, Oral, Q6H PRN **OR** acetaminophen (TYLENOL) suppository 650 mg, 650 mg, Rectal, Q6H PRN, Amelia Jo, MD .  amLODipine (NORVASC) tablet 10 mg, 10 mg, Oral, Daily, Amelia Jo, MD, 10 mg at 11/16/17 1034 .  bisacodyl (DULCOLAX) EC tablet 5 mg, 5 mg, Oral, Daily PRN, Amelia Jo, MD .  cyanocobalamin ((VITAMIN B-12)) injection 1,000 mcg, 1,000 mcg, Intramuscular, Q30 days, Amelia Jo, MD .  docusate sodium (COLACE) capsule 100 mg, 100 mg, Oral, BID, Amelia Jo, MD, 100 mg at 11/16/17 1034 .  heparin injection 5,000 Units, 5,000 Units, Subcutaneous, Q8H, Amelia Jo, MD, 5,000 Units at 11/16/17 0549 .  HYDROcodone-acetaminophen (NORCO/VICODIN) 5-325 MG per tablet 1-2 tablet, 1-2 tablet, Oral, Q4H PRN, Amelia Jo, MD, 1 tablet at 11/16/17 0548 .  HYDROmorphone (DILAUDID) injection 1 mg, 1 mg, Intravenous, Q4H PRN, Mody, Sital, MD .  insulin aspart (novoLOG) injection 0-15 Units, 0-15 Units, Subcutaneous, TID WC, Sridharan, Prasanna, MD .  insulin aspart (novoLOG) injection 0-5 Units, 0-5 Units, Subcutaneous, QHS, Sridharan, Prasanna, MD .  metoprolol succinate (TOPROL-XL) 24 hr tablet 50 mg, 50 mg, Oral, Daily, Amelia Jo, MD, 50 mg at 11/16/17 1034 .  ondansetron (ZOFRAN) tablet 4 mg, 4 mg, Oral, Q6H PRN **OR** ondansetron (ZOFRAN) injection 4 mg, 4 mg, Intravenous, Q6H PRN, Amelia Jo, MD .  pantoprazole (PROTONIX) EC tablet 40 mg, 40 mg, Oral,  Daily, Amelia Jo, MD, 40 mg at 11/16/17 1034  History reviewed. No pertinent family history.   Social History   Tobacco Use  . Smoking status: Never Smoker  . Smokeless tobacco: Never Used  Substance Use Topics  . Alcohol use: No  . Drug use: No    Allergies as of 11/15/2017 - Review Complete 11/15/2017  Allergen Reaction Noted  . No known allergies  05/04/2016    Review of Systems:    All systems reviewed and negative except where noted in HPI.   Physical Exam:  Vital signs in last 24 hours: Temp:  [98.2 F (36.8 C)-99 F (37.2 C)] 98.3 F (36.8 C) (10/26 1212) Pulse Rate:  [76-104] 76 (10/26 1212) Resp:  [17-21] 17 (10/26 1212) BP: (132-163)/(76-94) 132/76 (10/26 1212) SpO2:  [95 %-98 %] 95 % (10/26 1212) Weight:  [144.2 kg-150.6 kg] 144.2 kg (10/26 0220) Last BM Date: 11/15/17 General:   Pleasant, cooperative in NAD Head:  Normocephalic and atraumatic. Eyes:  No icterus.   Conjunctiva pink. PERRLA. Ears:  Normal auditory acuity. Neck:  Supple; no masses or thyroidomegaly Lungs: Respirations even and unlabored. Lungs clear to auscultation bilaterally.   No wheezes, crackles, or rhonchi.  Heart:  Regular rate and rhythm;  Without murmur, clicks, rubs or gallops Abdomen:  Soft, morbidly obese, mild epigastric tenderness, nondistended. Normal bowel sounds. No appreciable masses or hepatomegaly.  No rebound or guarding.  Rectal:  Not performed. Msk:  Symmetrical without gross deformities.  Strength normal Extremities:  Without edema, cyanosis or clubbing. Neurologic:  Alert and oriented x3;  grossly normal neurologically. Skin:  Intact without significant lesions or rashes. Cervical Nodes:  No significant cervical adenopathy. Psych:  Alert and cooperative. Normal affect.  LAB RESULTS: CBC Latest Ref Rng & Units 11/16/2017 11/15/2017 04/30/2016  WBC 4.0 - 10.5 K/uL 8.9 9.6 7.9  Hemoglobin 13.0 - 17.0 g/dL 14.5 16.1 17.0  Hematocrit 39.0 - 52.0 % 44.4 48.5 51.3    Platelets 150 - 400 K/uL 298 315 229    BMET BMP Latest Ref Rng & Units 11/16/2017 11/15/2017 04/30/2016  Glucose 70 - 99 mg/dL 134(H) 214(H) 99  BUN 6 - 20 mg/dL 11 17 12  Creatinine 0.61 - 1.24 mg/dL 0.54(L) 0.79 0.92  Sodium 135 - 145 mmol/L 137 135 137  Potassium 3.5 - 5.1 mmol/L 3.8 4.0 4.8  Chloride 98 - 111 mmol/L 103 101 104  CO2 22 - 32 mmol/L 24 23 25  Calcium 8.9 - 10.3 mg/dL 8.1(L) 8.7(L) 9.2    LFT Hepatic Function Latest Ref Rng & Units 11/16/2017 11/15/2017  Total Protein 6.5 - 8.1 g/dL 6.8 7.5  Albumin 3.5 - 5.0 g/dL 3.3(L) 3.8  AST 15 - 41 U/L 98(H) 139(H)  ALT 0 - 44 U/L 229(H) 280(H)  Alk Phosphatase 38 - 126 U/L 178(H) 191(H)  Total Bilirubin 0.3 - 1.2 mg/dL 4.8(H) 5.0(H)     STUDIES: Mr 3d Recon At Scanner  Result Date: 11/16/2017 CLINICAL DATA:  Abdominal pain, abnormal LFTs EXAM: MRI ABDOMEN WITHOUT AND WITH CONTRAST (INCLUDING MRCP) TECHNIQUE: Multiplanar multisequence MR imaging of the abdomen was performed both before and after the administration of intravenous contrast. Heavily T2-weighted images of the biliary and pancreatic ducts were obtained, and three-dimensional MRCP images were rendered by post processing. CONTRAST:  10 mL Gadovist IV COMPARISON:  Right upper quadrant ultrasound dated 11/15/2017 FINDINGS: Motion degraded images. Lower chest: Lung bases are clear. Hepatobiliary: Liver is within normal limits. No focal hepatic lesions. No hepatic steatosis. Small layering gallstones (series 4/image 27), without associated inflammatory changes. No intrahepatic ductal dilatation. Common duct measures 7-8 mm. Two filling defects in the mid common duct measuring up to 7 mm (series 13/image 12), reflecting common duct stones. Pancreas:  Within normal limits. Spleen:  Within normal limits. Adrenals/Urinary Tract:  Adrenal glands within normal limits. 7 mm right upper pole renal cyst (series 9/image 33). Left kidney is unremarkable. No hydronephrosis.  Stomach/Bowel: Stomach and visualized bowel are grossly unremarkable. Vascular/Lymphatic:  No evidence of abdominal aortic aneurysm. No suspicious abdominal lymphadenopathy. Other:  No abdominal ascites. Musculoskeletal: Lumbar spine fixation hardware. IMPRESSION: Choledocholithiasis with two mid common duct stones measuring up to 7 mm. ERCP is suggested. Cholelithiasis, without associated inflammatory changes. Electronically Signed   By: Sriyesh  Krishnan M.D.   On: 11/16/2017 07:22   Mr Abdomen Mrcp W Wo Contast  Result Date: 11/16/2017 CLINICAL DATA:  Abdominal pain, abnormal LFTs EXAM: MRI ABDOMEN WITHOUT AND WITH CONTRAST (INCLUDING MRCP) TECHNIQUE: Multiplanar multisequence   MR imaging of the abdomen was performed both before and after the administration of intravenous contrast. Heavily T2-weighted images of the biliary and pancreatic ducts were obtained, and three-dimensional MRCP images were rendered by post processing. CONTRAST:  10 mL Gadovist IV COMPARISON:  Right upper quadrant ultrasound dated 11/15/2017 FINDINGS: Motion degraded images. Lower chest: Lung bases are clear. Hepatobiliary: Liver is within normal limits. No focal hepatic lesions. No hepatic steatosis. Small layering gallstones (series 4/image 27), without associated inflammatory changes. No intrahepatic ductal dilatation. Common duct measures 7-8 mm. Two filling defects in the mid common duct measuring up to 7 mm (series 13/image 12), reflecting common duct stones. Pancreas:  Within normal limits. Spleen:  Within normal limits. Adrenals/Urinary Tract:  Adrenal glands within normal limits. 7 mm right upper pole renal cyst (series 9/image 33). Left kidney is unremarkable. No hydronephrosis. Stomach/Bowel: Stomach and visualized bowel are grossly unremarkable. Vascular/Lymphatic:  No evidence of abdominal aortic aneurysm. No suspicious abdominal lymphadenopathy. Other:  No abdominal ascites. Musculoskeletal: Lumbar spine fixation hardware.  IMPRESSION: Choledocholithiasis with two mid common duct stones measuring up to 7 mm. ERCP is suggested. Cholelithiasis, without associated inflammatory changes. Electronically Signed   By: Sriyesh  Krishnan M.D.   On: 11/16/2017 07:22   Us Abdomen Limited Ruq  Result Date: 11/15/2017 CLINICAL DATA:  49-year-old male with right upper quadrant abdominal pain and elevated bilirubin. EXAM: ULTRASOUND ABDOMEN LIMITED RIGHT UPPER QUADRANT COMPARISON:  CT of the abdomen pelvis dated 05/10/2014 FINDINGS: Gallbladder: The gallbladder is partially contracted. There is no gallstone, or pericholecystic fluid. Negative sonographic Murphy's sign. The gallbladder wall measures approximately 5 mm in thickness, likely secondary to underdistention. Common bile duct: Diameter: 4 mm Liver: There is diffuse increased liver echogenicity most consistent with fatty infiltration. There is a 2.0 x 3.0 x 2.5 cm hypoechoic area adjacent to the gallbladder most consistent with a focal area of fat sparing. Portal vein is patent on color Doppler imaging with normal direction of blood flow towards the liver. IMPRESSION: 1. No gallstone. 2. Fatty liver. Electronically Signed   By: Arash  Radparvar M.D.   On: 11/15/2017 21:36      Impression / Plan:   Mike Patel is a 57 y.o. Caucasian male with metabolic syndrome admitted with acute gallstone pancreatitis, cholelithiasis, choledocholithiasis  Acute gallstone pancreatitis: N.p.o. except ice chips IV fluids Pain control Recommend surgery consult for cholecystectomy  Choledocholithiasis: No evidence of acute cholangitis Monitor LFTs daily Discussed with patient about nonavailability of advanced endoscopist to perform ERCP over the weekend and could be transferred to [facility] or [facility].  He prefers to wait until Monday to undergo this procedure Will discuss with Dr. Wohl about his availability to perform ERCP Monday  Thank you for involving me in the care of this patient.        LOS: 1 day   Mike Vanga, MD  11/16/2017, 2:30 PM   Note: This dictation was prepared with Dragon dictation along with smaller phrase technology. Any transcriptional errors that result from this process are unintentional.  

## 2017-11-16 NOTE — ED Notes (Signed)
Pt going to MRI at this time , pt will be transferred to floor bed from MRI ,and not returning to ED .

## 2017-11-16 NOTE — Therapy (Signed)
Pt did not feel like he needed CPAP tonight.  I told the patient to call me if he decided he would like to wear the CPAP.  CPAP was not left in the room.

## 2017-11-16 NOTE — Progress Notes (Signed)
[facility] [place] NAME: Mike Patel    MR#:  [id number]  DATE OF BIRTH:  [birth year]  SUBJECTIVE:   Still with abdominal pain some mild nausea  REVIEW OF SYSTEMS:    Review of Systems  Constitutional: Negative for fever, chills weight loss HENT: Negative for ear pain, nosebleeds, congestion, facial swelling, rhinorrhea, neck pain, neck stiffness and ear discharge.   Respiratory: Negative for cough, shortness of breath, wheezing  Cardiovascular: Negative for chest pain, palpitations and leg swelling.  Gastrointestinal: Negative for heartburn, ++abdominal pain, no vomiting, diarrhea or consitpation Genitourinary: Negative for dysuria, urgency, frequency, hematuria Musculoskeletal: Negative for back pain or joint pain Neurological: Negative for dizziness, seizures, syncope, focal weakness,  numbness and headaches.  Hematological: Does not bruise/bleed easily.  Psychiatric/Behavioral: Negative for hallucinations, confusion, dysphoric mood    Tolerating Diet: npo      DRUG ALLERGIES:   Allergies  Allergen Reactions  . No Known Allergies   . Percocet [Oxycodone-Acetaminophen] Itching    VITALS:  Blood pressure (!) 141/83, pulse (!) 104, temperature 98.2 F (36.8 C), temperature source Oral, resp. rate 18, height 5\' 9"  (1.753 m), weight (!) 144.2 kg, SpO2 98 %.  PHYSICAL EXAMINATION:  Constitutional: Appears well-developed and well-nourished. No distress. HENT: Normocephalic. Marland Kitchen Oropharynx is clear and moist.  Eyes: Conjunctivae and EOM are normal. PERRLA, no scleral icterus.  Neck: Normal ROM. Neck supple. No JVD. No tracheal deviation. CVS: RRR, S1/S2 +, no murmurs, no gallops, no carotid bruit.  Pulmonary: Effort and breath sounds normal, no stridor, rhonchi, wheezes, rales.  Abdominal: Soft. BS +,  no distension,++epigastric tenderness, NO rebound or guarding.  Musculoskeletal: Normal range of motion. No edema and no  tenderness.  Neuro: Alert. CN 2-12 grossly intact. No focal deficits. Skin: Skin is warm and dry. No rash noted. Psychiatric: Normal mood and affect.      LABORATORY PANEL:   CBC Recent Labs  Lab 11/16/17 0720  WBC 8.9  HGB 14.5  HCT 44.4  PLT 298   ------------------------------------------------------------------------------------------------------------------  Chemistries  Recent Labs  Lab 11/15/17 1954  NA 135  K 4.0  CL 101  CO2 23  GLUCOSE 214*  BUN 17  CREATININE 0.79  CALCIUM 8.7*  AST 139*  ALT 280*  ALKPHOS 191*  BILITOT 5.0*   ------------------------------------------------------------------------------------------------------------------  Cardiac Enzymes Recent Labs  Lab 11/15/17 1954  TROPONINI <0.03   ------------------------------------------------------------------------------------------------------------------  RADIOLOGY:  Mr 3d Recon At Scanner  Result Date: 11/16/2017 CLINICAL DATA:  Abdominal pain, abnormal LFTs EXAM: MRI ABDOMEN WITHOUT AND WITH CONTRAST (INCLUDING MRCP) TECHNIQUE: Multiplanar multisequence MR imaging of the abdomen was performed both before and after the administration of intravenous contrast. Heavily T2-weighted images of the biliary and pancreatic ducts were obtained, and three-dimensional MRCP images were rendered by post processing. CONTRAST:  10 mL Gadovist IV COMPARISON:  Right upper quadrant ultrasound dated 11/15/2017 FINDINGS: Motion degraded images. Lower chest: Lung bases are clear. Hepatobiliary: Liver is within normal limits. No focal hepatic lesions. No hepatic steatosis. Small layering gallstones (series 4/image 27), without associated inflammatory changes. No intrahepatic ductal dilatation. Common duct measures 7-8 mm. Two filling defects in the mid common duct measuring up to 7 mm (series 13/image 12), reflecting common duct stones. Pancreas:  Within normal limits. Spleen:  Within normal limits.  Adrenals/Urinary Tract:  Adrenal glands within normal limits. 7 mm right upper pole renal cyst (series 9/image 33). Left kidney is unremarkable. No hydronephrosis. Stomach/Bowel: Stomach and visualized bowel  are grossly unremarkable. Vascular/Lymphatic:  No evidence of abdominal aortic aneurysm. No suspicious abdominal lymphadenopathy. Other:  No abdominal ascites. Musculoskeletal: Lumbar spine fixation hardware. IMPRESSION: Choledocholithiasis with two mid common duct stones measuring up to 7 mm. ERCP is suggested. Cholelithiasis, without associated inflammatory changes. Electronically Signed   By: Julian Hy M.D.   On: 11/16/2017 07:22   Mr Abdomen Mrcp Moise Boring Contast  Result Date: 11/16/2017 CLINICAL DATA:  Abdominal pain, abnormal LFTs EXAM: MRI ABDOMEN WITHOUT AND WITH CONTRAST (INCLUDING MRCP) TECHNIQUE: Multiplanar multisequence MR imaging of the abdomen was performed both before and after the administration of intravenous contrast. Heavily T2-weighted images of the biliary and pancreatic ducts were obtained, and three-dimensional MRCP images were rendered by post processing. CONTRAST:  10 mL Gadovist IV COMPARISON:  Right upper quadrant ultrasound dated 11/15/2017 FINDINGS: Motion degraded images. Lower chest: Lung bases are clear. Hepatobiliary: Liver is within normal limits. No focal hepatic lesions. No hepatic steatosis. Small layering gallstones (series 4/image 27), without associated inflammatory changes. No intrahepatic ductal dilatation. Common duct measures 7-8 mm. Two filling defects in the mid common duct measuring up to 7 mm (series 13/image 12), reflecting common duct stones. Pancreas:  Within normal limits. Spleen:  Within normal limits. Adrenals/Urinary Tract:  Adrenal glands within normal limits. 7 mm right upper pole renal cyst (series 9/image 33). Left kidney is unremarkable. No hydronephrosis. Stomach/Bowel: Stomach and visualized bowel are grossly unremarkable.  Vascular/Lymphatic:  No evidence of abdominal aortic aneurysm. No suspicious abdominal lymphadenopathy. Other:  No abdominal ascites. Musculoskeletal: Lumbar spine fixation hardware. IMPRESSION: Choledocholithiasis with two mid common duct stones measuring up to 7 mm. ERCP is suggested. Cholelithiasis, without associated inflammatory changes. Electronically Signed   By: Julian Hy M.D.   On: 11/16/2017 07:22   US Abdomen Limited Ruq  Result Date: 11/15/2017 CLINICAL DATA:  57 year old male with right upper quadrant abdominal pain and elevated bilirubin. EXAM: ULTRASOUND ABDOMEN LIMITED RIGHT UPPER QUADRANT COMPARISON:  CT of the abdomen pelvis dated 05/10/2014 FINDINGS: Gallbladder: The gallbladder is partially contracted. There is no gallstone, or pericholecystic fluid. Negative sonographic Murphy's sign. The gallbladder wall measures approximately 5 mm in thickness, likely secondary to underdistention. Common bile duct: Diameter: 4 mm Liver: There is diffuse increased liver echogenicity most consistent with fatty infiltration. There is a 2.0 x 3.0 x 2.5 cm hypoechoic area adjacent to the gallbladder most consistent with a focal area of fat sparing. Portal vein is patent on color Doppler imaging with normal direction of blood flow towards the liver. IMPRESSION: 1. No gallstone. 2. Fatty liver. Electronically Signed   By: Anner Crete M.D.   On: 11/15/2017 21:36     ASSESSMENT AND PLAN:   57 year old male with history of sleep apnea who presents to the emergency room due to epigastric abdominal pain.   1.  Acute gallstone pancreatitis: MRCP confirms gallstone pancreatitis Case discussed with GI and surgery Continue n.p.o. status and aggressive IV fluids Follow BMP and CBC closely Patient will eventually need ERCP and cholecystectomy. Continue PRN pain medications  2.  Essential hypertension: Continue Norvasc and metoprolol  3.  Obesity: Encouraged weight loss as tolerated  4.  B12  deficiency  5.  Sleep apnea: CPAP at night   Management plans discussed with the patient and wife and they are in agreement.  CODE STATUS: FULL  TOTAL TIME TAKING CARE OF THIS PATIENT: 30 minutes.     POSSIBLE D/C 3-5 days, DEPENDING ON CLINICAL CONDITION.   Nicki Gracy M.D on  11/16/2017 at 11:09 AM  Between 7am to 6pm - Pager - [phone] After 6pm go to [email or web address] - password [email or web address] [facility]  Office  [phone]  CC: Primary care physician; [place] Pink, MD  Note: This dictation was prepared with Dragon dictation along with smaller phrase technology. Any transcriptional errors that result from this process are unintentional.

## 2017-11-16 NOTE — Consult Note (Signed)
Date of Consultation:  11/16/2017  Requesting Physician:  Bettey Costa, MD  Reason for Consultation:  Gallstone pancreatitis  History of Present Illness: Mike Patel is a 57 y.o. male who presented to the emergency room yesterday with a 2 to 3-day history of epigastric and right upper quadrant abdominal pain.  Upon further work-up he was found to have acute gallstone pancreatitis with a lipase of thousand 84, as well as elevated LFTs with a total bilirubin of 5.0, AST 139, ALT 280, alkaline phosphatase 191.  He was admitted to the medical team and had an MRCP done overnight which showed choledocholithiasis.  GI was consulted for ERCP but there is no availability for ERCP over the weekend and the patient was given the option of waiting until Monday at least to have his ERCP done.  Tonight, the patient reports that his pain is improved and has not required much pain medication throughout the day.  Past Medical History: Past Medical History:  Diagnosis Date  . Arthritis   . GERD (gastroesophageal reflux disease)   . Hypertension   . Obesity   . Sleep apnea    cpap     Past Surgical History: Past Surgical History:  Procedure Laterality Date  . BACK SURGERY     x2  . COLONOSCOPY N/A 05/24/2014   Procedure: COLONOSCOPY;  Surgeon: Hulen Luster, MD;  Location: [facility] ENDOSCOPY;  Service: Gastroenterology;  Laterality: N/A;  . ELBOW SURGERY Right    tennis elbow  . ESOPHAGOGASTRODUODENOSCOPY N/A 05/24/2014   Procedure: ESOPHAGOGASTRODUODENOSCOPY (EGD);  Surgeon: Hulen Luster, MD;  Location: [facility] ENDOSCOPY;  Service: Gastroenterology;  Laterality: N/A;  . PROSTATE SURGERY    . SPINE SURGERY     l-spine x 2    Home Medications: Prior to Admission medications   Medication Sig Start Date End Date Taking? Authorizing Provider  amLODipine (NORVASC) 10 MG tablet Take 10 mg by mouth daily.   Yes [provider]  aspirin 81 MG tablet Take 81 mg by mouth daily.   Yes [provider]   cyanocobalamin (,VITAMIN B-12,) 1000 MCG/ML injection Inject 1 mL into the muscle every 30 (thirty) days. 10/25/17  Yes [provider]  lansoprazole (PREVACID) 30 MG capsule Take 30 mg by mouth every morning.    Yes [provider]  lisinopril (PRINIVIL,ZESTRIL) 40 MG tablet Take 40 mg by mouth 2 (two) times daily.   Yes [provider]  meloxicam (MOBIC) 15 MG tablet Take 15 mg by mouth daily. 08/27/17  Yes [provider]  metoprolol succinate (TOPROL-XL) 50 MG 24 hr tablet Take 50 mg by mouth daily. Take with or immediately following a meal.   Yes [provider]  tadalafil (CIALIS) 5 MG tablet Take 1 tablet by mouth as needed. 11/05/17  Yes [provider]  testosterone cypionate (DEPOTESTOSTERONE CYPIONATE) 200 MG/ML injection Inject 1 mL into the muscle every 14 (fourteen) days. 10/25/17  Yes [provider]  diazepam (VALIUM) 5 MG tablet Take 1-2 tablets (5-10 mg total) by mouth every 6 (six) hours as needed for muscle spasms. Patient not taking: Reported on 11/15/2017 05/08/16   Earnie Larsson, MD  oseltamivir (TAMIFLU) 75 MG capsule Take 1 capsule by mouth 2 (two) times daily. 11/07/17   [provider]  oxyCODONE-acetaminophen (ROXICET) 5-325 MG tablet Take 1-2 tablets by mouth every 4 (four) hours as needed for severe pain. Patient not taking: Reported on 11/15/2017 05/08/16   Earnie Larsson, MD    Allergies: Allergies  Allergen Reactions  . No Known Allergies   . Percocet [Oxycodone-Acetaminophen] Itching    Social History:  reports that he has never smoked. He has never used smokeless tobacco. He reports that he does not drink alcohol or use drugs.   Family History: History reviewed. No pertinent family history.  Review of Systems: Review of Systems  Constitutional: Negative for chills and fever.  HENT: Negative for hearing loss.   Eyes: Negative for blurred vision.  Respiratory: Negative for shortness of  breath.   Cardiovascular: Negative for chest pain.  Gastrointestinal: Positive for abdominal pain. Negative for constipation, diarrhea, nausea and vomiting.  Genitourinary: Negative for dysuria.  Musculoskeletal: Negative for myalgias.  Skin: Negative for rash.  Neurological: Negative for dizziness.  Psychiatric/Behavioral: Negative for depression.    Physical Exam BP (!) 151/81 (BP Location: Left Arm)   Pulse 73   Temp 98.3 F (36.8 C) (Oral)   Resp 18   Ht 5\' 9"  (1.753 m)   Wt (!) 144.2 kg   SpO2 98%   BMI 46.93 kg/m  CONSTITUTIONAL: No acute distress HEENT:  Normocephalic, atraumatic, extraocular motion intact. NECK: Trachea is midline, and there is no jugular venous distension. RESPIRATORY:  Lungs are clear, and breath sounds are equal bilaterally. Normal respiratory effort without pathologic use of accessory muscles. CARDIOVASCULAR: Heart is regular without murmurs, gallops, or rubs. GI: The abdomen is soft, obese, nondistended, with tenderness to palpation in the epigastric and right upper quadrant area which is moderate..  MUSCULOSKELETAL:  Normal muscle strength and tone in all four extremities.  No peripheral edema or cyanosis. SKIN: Skin turgor is normal. There are no pathologic skin lesions.  NEUROLOGIC:  Motor and sensation is grossly normal.  Cranial nerves are grossly intact. PSYCH:  Alert and oriented to person, place and time. Affect is normal.  Laboratory Analysis: Results for orders placed or performed during the hospital encounter of 11/15/17 (from the past 24 hour(s))  CBC     Status: None   Collection Time: 11/16/17  7:20 AM  Result Value Ref Range   WBC 8.9 4.0 - 10.5 K/uL   RBC 4.77 4.22 - 5.81 MIL/uL   Hemoglobin 14.5 13.0 - 17.0 g/dL   HCT 44.4 39.0 - 52.0 %   MCV 93.1 80.0 - 100.0 fL   MCH 30.4 26.0 - 34.0 pg   MCHC 32.7 30.0 - 36.0 g/dL   RDW 13.4 11.5 - 15.5 %   Platelets 298 150 - 400 K/uL   nRBC 0.0 0.0 - 0.2 %  Bilirubin, direct      Status: Abnormal   Collection Time: 11/16/17  7:20 AM  Result Value Ref Range   Bilirubin, Direct 3.1 (H) 0.0 - 0.2 mg/dL  Glucose, capillary     Status: Abnormal   Collection Time: 11/16/17  7:39 AM  Result Value Ref Range   Glucose-Capillary 134 (H) 70 - 99 mg/dL  Glucose, capillary     Status: Abnormal   Collection Time: 11/16/17 11:38 AM  Result Value Ref Range   Glucose-Capillary 121 (H) 70 - 99 mg/dL  Comprehensive metabolic panel     Status: Abnormal   Collection Time: 11/16/17  1:00 PM  Result Value Ref Range   Sodium 137 135 - 145 mmol/L   Potassium 3.8 3.5 - 5.1 mmol/L   Chloride 103 98 - 111 mmol/L   CO2 24 22 - 32 mmol/L   Glucose, Bld 134 (H) 70 - 99 mg/dL   BUN 11 6 - 20  mg/dL   Creatinine, Ser 0.54 (L) 0.61 - 1.24 mg/dL   Calcium 8.1 (L) 8.9 - 10.3 mg/dL   Total Protein 6.8 6.5 - 8.1 g/dL   Albumin 3.3 (L) 3.5 - 5.0 g/dL   AST 98 (H) 15 - 41 U/L   ALT 229 (H) 0 - 44 U/L   Alkaline Phosphatase 178 (H) 38 - 126 U/L   Total Bilirubin 4.8 (H) 0.3 - 1.2 mg/dL   GFR calc non Af Amer >60 >60 mL/min   GFR calc Af Amer >60 >60 mL/min   Anion gap 10 5 - 15  Glucose, capillary     Status: Abnormal   Collection Time: 11/16/17  4:35 PM  Result Value Ref Range   Glucose-Capillary 108 (H) 70 - 99 mg/dL    Imaging: Mr 3d Recon At Scanner  Result Date: 11/16/2017 CLINICAL DATA:  Abdominal pain, abnormal LFTs EXAM: MRI ABDOMEN WITHOUT AND WITH CONTRAST (INCLUDING MRCP) TECHNIQUE: Multiplanar multisequence MR imaging of the abdomen was performed both before and after the administration of intravenous contrast. Heavily T2-weighted images of the biliary and pancreatic ducts were obtained, and three-dimensional MRCP images were rendered by post processing. CONTRAST:  10 mL Gadovist IV COMPARISON:  Right upper quadrant ultrasound dated 11/15/2017 FINDINGS: Motion degraded images. Lower chest: Lung bases are clear. Hepatobiliary: Liver is within normal limits. No focal hepatic  lesions. No hepatic steatosis. Small layering gallstones (series 4/image 27), without associated inflammatory changes. No intrahepatic ductal dilatation. Common duct measures 7-8 mm. Two filling defects in the mid common duct measuring up to 7 mm (series 13/image 12), reflecting common duct stones. Pancreas:  Within normal limits. Spleen:  Within normal limits. Adrenals/Urinary Tract:  Adrenal glands within normal limits. 7 mm right upper pole renal cyst (series 9/image 33). Left kidney is unremarkable. No hydronephrosis. Stomach/Bowel: Stomach and visualized bowel are grossly unremarkable. Vascular/Lymphatic:  No evidence of abdominal aortic aneurysm. No suspicious abdominal lymphadenopathy. Other:  No abdominal ascites. Musculoskeletal: Lumbar spine fixation hardware. IMPRESSION: Choledocholithiasis with two mid common duct stones measuring up to 7 mm. ERCP is suggested. Cholelithiasis, without associated inflammatory changes. Electronically Signed   By: Julian Hy M.D.   On: 11/16/2017 07:22   Mr Abdomen Mrcp Moise Boring Contast  Result Date: 11/16/2017 CLINICAL DATA:  Abdominal pain, abnormal LFTs EXAM: MRI ABDOMEN WITHOUT AND WITH CONTRAST (INCLUDING MRCP) TECHNIQUE: Multiplanar multisequence MR imaging of the abdomen was performed both before and after the administration of intravenous contrast. Heavily T2-weighted images of the biliary and pancreatic ducts were obtained, and three-dimensional MRCP images were rendered by post processing. CONTRAST:  10 mL Gadovist IV COMPARISON:  Right upper quadrant ultrasound dated 11/15/2017 FINDINGS: Motion degraded images. Lower chest: Lung bases are clear. Hepatobiliary: Liver is within normal limits. No focal hepatic lesions. No hepatic steatosis. Small layering gallstones (series 4/image 27), without associated inflammatory changes. No intrahepatic ductal dilatation. Common duct measures 7-8 mm. Two filling defects in the mid common duct measuring up to 7 mm  (series 13/image 12), reflecting common duct stones. Pancreas:  Within normal limits. Spleen:  Within normal limits. Adrenals/Urinary Tract:  Adrenal glands within normal limits. 7 mm right upper pole renal cyst (series 9/image 33). Left kidney is unremarkable. No hydronephrosis. Stomach/Bowel: Stomach and visualized bowel are grossly unremarkable. Vascular/Lymphatic:  No evidence of abdominal aortic aneurysm. No suspicious abdominal lymphadenopathy. Other:  No abdominal ascites. Musculoskeletal: Lumbar spine fixation hardware. IMPRESSION: Choledocholithiasis with two mid common duct stones measuring up to 7 mm. ERCP  is suggested. Cholelithiasis, without associated inflammatory changes. Electronically Signed   By: Julian Hy M.D.   On: 11/16/2017 07:22   US Abdomen Limited Ruq  Result Date: 11/15/2017 CLINICAL DATA:  57 year old male with right upper quadrant abdominal pain and elevated bilirubin. EXAM: ULTRASOUND ABDOMEN LIMITED RIGHT UPPER QUADRANT COMPARISON:  CT of the abdomen pelvis dated 05/10/2014 FINDINGS: Gallbladder: The gallbladder is partially contracted. There is no gallstone, or pericholecystic fluid. Negative sonographic Murphy's sign. The gallbladder wall measures approximately 5 mm in thickness, likely secondary to underdistention. Common bile duct: Diameter: 4 mm Liver: There is diffuse increased liver echogenicity most consistent with fatty infiltration. There is a 2.0 x 3.0 x 2.5 cm hypoechoic area adjacent to the gallbladder most consistent with a focal area of fat sparing. Portal vein is patent on color Doppler imaging with normal direction of blood flow towards the liver. IMPRESSION: 1. No gallstone. 2. Fatty liver. Electronically Signed   By: Anner Crete M.D.   On: 11/15/2017 21:36    Assessment and Plan: This is a 57 y.o. male with acute gallstone pancreatitis and choledocholithiasis on MRCP.  I have independently viewed the patient's imaging study and reviewed the  patient's laboratory studies.  Overall studies that show choledocholithiasis with 2 stones in the common bile duct, elevated LFTs, elevated lipase, abdominal pain.  His WBC is normal at 8.9.  Discussed with patient that given the findings on his imaging studies, he requires an ERCP first.  There is no need to wait until at least Monday to do the ERCP and he could be transferred out to get this done sooner if possible.  Discussed with the patient that after ERCP we will discuss further role for cholecystectomy so that this episode does not happen in the future again.  Unclear at this point when exactly next week we would be able to do his case as I am the only surgeon in my group available next week.   Melvyn Neth, MD [facility] Pg:  [phone]

## 2017-11-17 LAB — CBC WITH DIFFERENTIAL/PLATELET
ABS IMMATURE GRANULOCYTES: 0.07 10*3/uL (ref 0.00–0.07)
BASOS PCT: 1 %
Basophils Absolute: 0 10*3/uL (ref 0.0–0.1)
Eosinophils Absolute: 0.2 10*3/uL (ref 0.0–0.5)
Eosinophils Relative: 3 %
HCT: 44.9 % (ref 39.0–52.0)
Hemoglobin: 14.8 g/dL (ref 13.0–17.0)
IMMATURE GRANULOCYTES: 1 %
Lymphocytes Relative: 36 %
Lymphs Abs: 2.2 10*3/uL (ref 0.7–4.0)
MCH: 30.6 pg (ref 26.0–34.0)
MCHC: 33 g/dL (ref 30.0–36.0)
MCV: 93 fL (ref 80.0–100.0)
Monocytes Absolute: 0.8 10*3/uL (ref 0.1–1.0)
Monocytes Relative: 13 %
NEUTROS ABS: 2.9 10*3/uL (ref 1.7–7.7)
NEUTROS PCT: 46 %
NRBC: 0 % (ref 0.0–0.2)
PLATELETS: 321 10*3/uL (ref 150–400)
RBC: 4.83 MIL/uL (ref 4.22–5.81)
RDW: 13.3 % (ref 11.5–15.5)
WBC: 6.2 10*3/uL (ref 4.0–10.5)

## 2017-11-17 LAB — COMPREHENSIVE METABOLIC PANEL
ALBUMIN: 3.3 g/dL — AB (ref 3.5–5.0)
ALK PHOS: 159 U/L — AB (ref 38–126)
ALT: 191 U/L — ABNORMAL HIGH (ref 0–44)
AST: 67 U/L — AB (ref 15–41)
Anion gap: 8 (ref 5–15)
BUN: 10 mg/dL (ref 6–20)
CHLORIDE: 104 mmol/L (ref 98–111)
CO2: 25 mmol/L (ref 22–32)
Calcium: 8.2 mg/dL — ABNORMAL LOW (ref 8.9–10.3)
Creatinine, Ser: 0.64 mg/dL (ref 0.61–1.24)
GFR calc non Af Amer: >60 mL/min (ref >60)
GLUCOSE: 120 mg/dL — AB (ref 70–99)
Potassium: 4.1 mmol/L (ref 3.5–5.1)
Sodium: 137 mmol/L (ref 135–145)
Total Bilirubin: 3.3 mg/dL — ABNORMAL HIGH (ref 0.3–1.2)
Total Protein: 6.5 g/dL (ref 6.5–8.1)

## 2017-11-17 LAB — GLUCOSE, CAPILLARY
GLUCOSE-CAPILLARY: 112 mg/dL — AB (ref 70–99)
GLUCOSE-CAPILLARY: 130 mg/dL — AB (ref 70–99)
Glucose-Capillary: 103 mg/dL — ABNORMAL HIGH (ref 70–99)
Glucose-Capillary: 178 mg/dL — ABNORMAL HIGH (ref 70–99)

## 2017-11-17 LAB — LIPASE, BLOOD: Lipase: 29 U/L (ref 11–51)

## 2017-11-17 NOTE — Progress Notes (Signed)
Mike Darby, MD [address]  [place]  [place], [place] [address]  Main: [phone]  Fax: [phone] Pager: [phone]   Subjective: Tolerating clear liquid diet well, denies any GI symptoms. Sitting in chair. Denies fever, chills  Objective: Vital signs in last 24 hours: Vitals:   11/16/17 2026 11/17/17 0500 11/17/17 0525 11/17/17 1229  BP: (!) 151/81  (!) 145/79 (!) 130/100  Pulse: 73  75 67  Resp: 18  20 20   Temp: 98.3 F (36.8 C)  97.8 F (36.6 C) 97.6 F (36.4 C)  TempSrc: Oral  Oral Oral  SpO2: 98%  93% 95%  Weight:  (!) 142.1 kg    Height:       Weight change: -8.528 kg  Intake/Output Summary (Last 24 hours) at 11/17/2017 1331 Last data filed at 11/17/2017 0700 Gross per 24 hour  Intake 3287.89 ml  Output 0 ml  Net 3287.89 ml     Exam: Heart:: Regular rate and rhythm or S1S2 present Lungs: normal and clear to auscultation Abdomen: soft, nontender, normal bowel sounds   Lab Results: CMP Latest Ref Rng & Units 11/17/2017 11/16/2017 11/15/2017  Glucose 70 - 99 mg/dL 120(H) 134(H) 214(H)  BUN 6 - 20 mg/dL 10 11 17   Creatinine 0.61 - 1.24 mg/dL 0.64 0.54(L) 0.79  Sodium 135 - 145 mmol/L 137 137 135  Potassium 3.5 - 5.1 mmol/L 4.1 3.8 4.0  Chloride 98 - 111 mmol/L 104 103 101  CO2 22 - 32 mmol/L 25 24 23   Calcium 8.9 - 10.3 mg/dL 8.2(L) 8.1(L) 8.7(L)  Total Protein 6.5 - 8.1 g/dL 6.5 6.8 7.5  Total Bilirubin 0.3 - 1.2 mg/dL 3.3(H) 4.8(H) 5.0(H)  Alkaline Phos 38 - 126 U/L 159(H) 178(H) 191(H)  AST 15 - 41 U/L 67(H) 98(H) 139(H)  ALT 0 - 44 U/L 191(H) 229(H) 280(H)   CBC Latest Ref Rng & Units 11/17/2017 11/16/2017 11/15/2017  WBC 4.0 - 10.5 K/uL 6.2 8.9 9.6  Hemoglobin 13.0 - 17.0 g/dL 14.8 14.5 16.1  Hematocrit 39.0 - 52.0 % 44.9 44.4 48.5  Platelets 150 - 400 K/uL 321 298 315    Micro Results: No results found for this or any previous visit (from the past 240 hour(s)). Studies/Results: Mr 3d Recon At Scanner  Result  Date: 11/16/2017 CLINICAL DATA:  Abdominal pain, abnormal LFTs EXAM: MRI ABDOMEN WITHOUT AND WITH CONTRAST (INCLUDING MRCP) TECHNIQUE: Multiplanar multisequence MR imaging of the abdomen was performed both before and after the administration of intravenous contrast. Heavily T2-weighted images of the biliary and pancreatic ducts were obtained, and three-dimensional MRCP images were rendered by post processing. CONTRAST:  10 mL Gadovist IV COMPARISON:  Right upper quadrant ultrasound dated 11/15/2017 FINDINGS: Motion degraded images. Lower chest: Lung bases are clear. Hepatobiliary: Liver is within normal limits. No focal hepatic lesions. No hepatic steatosis. Small layering gallstones (series 4/image 27), without associated inflammatory changes. No intrahepatic ductal dilatation. Common duct measures 7-8 mm. Two filling defects in the mid common duct measuring up to 7 mm (series 13/image 12), reflecting common duct stones. Pancreas:  Within normal limits. Spleen:  Within normal limits. Adrenals/Urinary Tract:  Adrenal glands within normal limits. 7 mm right upper pole renal cyst (series 9/image 33). Left kidney is unremarkable. No hydronephrosis. Stomach/Bowel: Stomach and visualized bowel are grossly unremarkable. Vascular/Lymphatic:  No evidence of abdominal aortic aneurysm. No suspicious abdominal lymphadenopathy. Other:  No abdominal ascites. Musculoskeletal: Lumbar spine fixation hardware. IMPRESSION: Choledocholithiasis with two mid common duct stones measuring  up to 7 mm. ERCP is suggested. Cholelithiasis, without associated inflammatory changes. Electronically Signed   By: Julian Hy M.D.   On: 11/16/2017 07:22   Mr Abdomen Mrcp Moise Boring Contast  Result Date: 11/16/2017 CLINICAL DATA:  Abdominal pain, abnormal LFTs EXAM: MRI ABDOMEN WITHOUT AND WITH CONTRAST (INCLUDING MRCP) TECHNIQUE: Multiplanar multisequence MR imaging of the abdomen was performed both before and after the administration of  intravenous contrast. Heavily T2-weighted images of the biliary and pancreatic ducts were obtained, and three-dimensional MRCP images were rendered by post processing. CONTRAST:  10 mL Gadovist IV COMPARISON:  Right upper quadrant ultrasound dated 11/15/2017 FINDINGS: Motion degraded images. Lower chest: Lung bases are clear. Hepatobiliary: Liver is within normal limits. No focal hepatic lesions. No hepatic steatosis. Small layering gallstones (series 4/image 27), without associated inflammatory changes. No intrahepatic ductal dilatation. Common duct measures 7-8 mm. Two filling defects in the mid common duct measuring up to 7 mm (series 13/image 12), reflecting common duct stones. Pancreas:  Within normal limits. Spleen:  Within normal limits. Adrenals/Urinary Tract:  Adrenal glands within normal limits. 7 mm right upper pole renal cyst (series 9/image 33). Left kidney is unremarkable. No hydronephrosis. Stomach/Bowel: Stomach and visualized bowel are grossly unremarkable. Vascular/Lymphatic:  No evidence of abdominal aortic aneurysm. No suspicious abdominal lymphadenopathy. Other:  No abdominal ascites. Musculoskeletal: Lumbar spine fixation hardware. IMPRESSION: Choledocholithiasis with two mid common duct stones measuring up to 7 mm. ERCP is suggested. Cholelithiasis, without associated inflammatory changes. Electronically Signed   By: Julian Hy M.D.   On: 11/16/2017 07:22   US Abdomen Limited Ruq  Result Date: 11/15/2017 CLINICAL DATA:  57 year old male with right upper quadrant abdominal pain and elevated bilirubin. EXAM: ULTRASOUND ABDOMEN LIMITED RIGHT UPPER QUADRANT COMPARISON:  CT of the abdomen pelvis dated 05/10/2014 FINDINGS: Gallbladder: The gallbladder is partially contracted. There is no gallstone, or pericholecystic fluid. Negative sonographic Murphy's sign. The gallbladder wall measures approximately 5 mm in thickness, likely secondary to underdistention. Common bile duct: Diameter: 4  mm Liver: There is diffuse increased liver echogenicity most consistent with fatty infiltration. There is a 2.0 x 3.0 x 2.5 cm hypoechoic area adjacent to the gallbladder most consistent with a focal area of fat sparing. Portal vein is patent on color Doppler imaging with normal direction of blood flow towards the liver. IMPRESSION: 1. No gallstone. 2. Fatty liver. Electronically Signed   By: Anner Crete M.D.   On: 11/15/2017 21:36   Medications: I have reviewed the patient's current medications. Scheduled Meds: . amLODipine  10 mg Oral Daily  . cyanocobalamin  1,000 mcg Intramuscular Q30 days  . docusate sodium  100 mg Oral BID  . heparin  5,000 Units Subcutaneous Q8H  . insulin aspart  0-15 Units Subcutaneous TID WC  . insulin aspart  0-5 Units Subcutaneous QHS  . metoprolol succinate  50 mg Oral Daily  . pantoprazole  40 mg Oral Daily  . testosterone cypionate  200 mg Intramuscular Once   Continuous Infusions: . sodium chloride 150 mL/hr at 11/17/17 1114   PRN Meds:.acetaminophen **OR** acetaminophen, bisacodyl, HYDROcodone-acetaminophen, HYDROmorphone (DILAUDID) injection, ondansetron **OR** ondansetron (ZOFRAN) IV   Assessment: Active Problems:   Acute gallstone pancreatitis  Choledocholithiasis without ascending cholangitis LFTs are slowly improving  Plan: Plan for ERCP by Dr. Allen Norris tomorrow N.p.o. past midnight   LOS: 2 days   Rohini Vanga 11/17/2017, 1:31 PM

## 2017-11-17 NOTE — Progress Notes (Signed)
[place] [facility] [place] NAME: Mike Patel    MR#:  [id number]  DATE OF BIRTH:  09-22-1960  SUBJECTIVE:   General pain is improved and patient is hungry  REVIEW OF SYSTEMS:    Review of Systems  Constitutional: Negative for fever, chills weight loss HENT: Negative for ear pain, nosebleeds, congestion, facial swelling, rhinorrhea, neck pain, neck stiffness and ear discharge.   Respiratory: Negative for cough, shortness of breath, wheezing  Cardiovascular: Negative for chest pain, palpitations and leg swelling.  Gastrointestinal: Negative for heartburn, denies abdominal pain, no vomiting, diarrhea or consitpation Genitourinary: Negative for dysuria, urgency, frequency, hematuria Musculoskeletal: Negative for back pain or joint pain Neurological: Negative for dizziness, seizures, syncope, focal weakness,  numbness and headaches.  Hematological: Does not bruise/bleed easily.  Psychiatric/Behavioral: Negative for hallucinations, confusion, dysphoric mood    Tolerating Diet: npo      DRUG ALLERGIES:   Allergies  Allergen Reactions  . No Known Allergies   . Percocet [Oxycodone-Acetaminophen] Itching    VITALS:  Blood pressure (!) 145/79, pulse 75, temperature 97.8 F (36.6 C), temperature source Oral, resp. rate 20, height 5\' 9"  (1.753 m), weight (!) 142.1 kg, SpO2 93 %.  PHYSICAL EXAMINATION:  Constitutional: Appears well-developed and well-nourished. No distress. HENT: Normocephalic. Marland Kitchen Oropharynx is clear and moist.  Eyes: Conjunctivae and EOM are normal. PERRLA, no scleral icterus.  Neck: Normal ROM. Neck supple. No JVD. No tracheal deviation. CVS: RRR, S1/S2 +, no murmurs, no gallops, no carotid bruit.  Pulmonary: Effort and breath sounds normal, no stridor, rhonchi, wheezes, rales.  Abdominal: Soft. BS +,  no distension,no epigastric tenderness, NO rebound or guarding.  Musculoskeletal: Normal range of motion. No edema and no  tenderness.  Neuro: Alert. CN 2-12 grossly intact. No focal deficits. Skin: Skin is warm and dry. No rash noted. Psychiatric: Normal mood and affect.      LABORATORY PANEL:   CBC Recent Labs  Lab 11/17/17 0422  WBC 6.2  HGB 14.8  HCT 44.9  PLT 321   ------------------------------------------------------------------------------------------------------------------  Chemistries  Recent Labs  Lab 11/17/17 0422  NA 137  K 4.1  CL 104  CO2 25  GLUCOSE 120*  BUN 10  CREATININE 0.64  CALCIUM 8.2*  AST 67*  ALT 191*  ALKPHOS 159*  BILITOT 3.3*   ------------------------------------------------------------------------------------------------------------------  Cardiac Enzymes Recent Labs  Lab 11/15/17 1954  TROPONINI <0.03   ------------------------------------------------------------------------------------------------------------------  RADIOLOGY:  Mr 3d Recon At Scanner  Result Date: 11/16/2017 CLINICAL DATA:  Abdominal pain, abnormal LFTs EXAM: MRI ABDOMEN WITHOUT AND WITH CONTRAST (INCLUDING MRCP) TECHNIQUE: Multiplanar multisequence MR imaging of the abdomen was performed both before and after the administration of intravenous contrast. Heavily T2-weighted images of the biliary and pancreatic ducts were obtained, and three-dimensional MRCP images were rendered by post processing. CONTRAST:  10 mL Gadovist IV COMPARISON:  Right upper quadrant ultrasound dated 11/15/2017 FINDINGS: Motion degraded images. Lower chest: Lung bases are clear. Hepatobiliary: Liver is within normal limits. No focal hepatic lesions. No hepatic steatosis. Small layering gallstones (series 4/image 27), without associated inflammatory changes. No intrahepatic ductal dilatation. Common duct measures 7-8 mm. Two filling defects in the mid common duct measuring up to 7 mm (series 13/image 12), reflecting common duct stones. Pancreas:  Within normal limits. Spleen:  Within normal limits.  Adrenals/Urinary Tract:  Adrenal glands within normal limits. 7 mm right upper pole renal cyst (series 9/image 33). Left kidney is unremarkable. No hydronephrosis. Stomach/Bowel: Stomach and  visualized bowel are grossly unremarkable. Vascular/Lymphatic:  No evidence of abdominal aortic aneurysm. No suspicious abdominal lymphadenopathy. Other:  No abdominal ascites. Musculoskeletal: Lumbar spine fixation hardware. IMPRESSION: Choledocholithiasis with two mid common duct stones measuring up to 7 mm. ERCP is suggested. Cholelithiasis, without associated inflammatory changes. Electronically Signed   By: Julian Hy M.D.   On: 11/16/2017 07:22   Mr Abdomen Mrcp Moise Boring Contast  Result Date: 11/16/2017 CLINICAL DATA:  Abdominal pain, abnormal LFTs EXAM: MRI ABDOMEN WITHOUT AND WITH CONTRAST (INCLUDING MRCP) TECHNIQUE: Multiplanar multisequence MR imaging of the abdomen was performed both before and after the administration of intravenous contrast. Heavily T2-weighted images of the biliary and pancreatic ducts were obtained, and three-dimensional MRCP images were rendered by post processing. CONTRAST:  10 mL Gadovist IV COMPARISON:  Right upper quadrant ultrasound dated 11/15/2017 FINDINGS: Motion degraded images. Lower chest: Lung bases are clear. Hepatobiliary: Liver is within normal limits. No focal hepatic lesions. No hepatic steatosis. Small layering gallstones (series 4/image 27), without associated inflammatory changes. No intrahepatic ductal dilatation. Common duct measures 7-8 mm. Two filling defects in the mid common duct measuring up to 7 mm (series 13/image 12), reflecting common duct stones. Pancreas:  Within normal limits. Spleen:  Within normal limits. Adrenals/Urinary Tract:  Adrenal glands within normal limits. 7 mm right upper pole renal cyst (series 9/image 33). Left kidney is unremarkable. No hydronephrosis. Stomach/Bowel: Stomach and visualized bowel are grossly unremarkable.  Vascular/Lymphatic:  No evidence of abdominal aortic aneurysm. No suspicious abdominal lymphadenopathy. Other:  No abdominal ascites. Musculoskeletal: Lumbar spine fixation hardware. IMPRESSION: Choledocholithiasis with two mid common duct stones measuring up to 7 mm. ERCP is suggested. Cholelithiasis, without associated inflammatory changes. Electronically Signed   By: Julian Hy M.D.   On: 11/16/2017 07:22   US Abdomen Limited Ruq  Result Date: 11/15/2017 CLINICAL DATA:  57 year old male with right upper quadrant abdominal pain and elevated bilirubin. EXAM: ULTRASOUND ABDOMEN LIMITED RIGHT UPPER QUADRANT COMPARISON:  CT of the abdomen pelvis dated 05/10/2014 FINDINGS: Gallbladder: The gallbladder is partially contracted. There is no gallstone, or pericholecystic fluid. Negative sonographic Murphy's sign. The gallbladder wall measures approximately 5 mm in thickness, likely secondary to underdistention. Common bile duct: Diameter: 4 mm Liver: There is diffuse increased liver echogenicity most consistent with fatty infiltration. There is a 2.0 x 3.0 x 2.5 cm hypoechoic area adjacent to the gallbladder most consistent with a focal area of fat sparing. Portal vein is patent on color Doppler imaging with normal direction of blood flow towards the liver. IMPRESSION: 1. No gallstone. 2. Fatty liver. Electronically Signed   By: Anner Crete M.D.   On: 11/15/2017 21:36     ASSESSMENT AND PLAN:   57 year old male with history of sleep apnea who presents to the emergency room due to epigastric abdominal pain.   1.  Acute gallstone pancreatitis: MRCP confirms gallstone pancreatitis Plan for ERCP on Monday with Dr. Allen Norris and will need cholecystectomy likely as an outpatient as per my conversation today with surgery. Clear liquid diet and n.p.o. after midnight    2.  Essential hypertension: Continue Norvasc and metoprolol  3.  Obesity: Encouraged weight loss as tolerated  4.  B12  deficiency  5.  Sleep apnea: CPAP at night   D/w dr Hampton Abbot   Management plans discussed with the patient and wife and they are in agreement.  CODE STATUS: FULL  TOTAL TIME TAKING CARE OF THIS PATIENT: 24 minutes.     POSSIBLE D/C 2=3 days,  DEPENDING ON CLINICAL CONDITION.   Reeanna Acri M.D on 11/17/2017 at 10:08 AM  Between 7am to 6pm - Pager - [phone] After 6pm go to [email or web address] - password [email or web address] [facility]  Office  [phone]  CC: Primary care physician; [place] Pink, MD  Note: This dictation was prepared with Dragon dictation along with smaller phrase technology. Any transcriptional errors that result from this process are unintentional.

## 2017-11-17 NOTE — Progress Notes (Signed)
11/17/2017  Subjective: No acute events overnight.  Patient's pain is improved and was started on clears this morning.  Vital signs: Temp:  [97.6 F (36.4 C)-98.3 F (36.8 C)] 97.6 F (36.4 C) (10/27 1229) Pulse Rate:  [67-75] 67 (10/27 1229) Resp:  [18-20] 20 (10/27 1229) BP: (130-151)/(79-100) 130/100 (10/27 1229) SpO2:  [93 %-98 %] 95 % (10/27 1229) Weight:  [142.1 kg] 142.1 kg (10/27 0500)   Intake/Output: 10/26 0701 - 10/27 0700 In: 3287.9 [I.V.:3287.9] Out: 0  Last BM Date: 11/15/17  Physical Exam: Constitutional: No acute distress Abdomen:  Soft, obese, nondistended, nontender  Labs:  Recent Labs    11/16/17 0720 11/17/17 0422  WBC 8.9 6.2  HGB 14.5 14.8  HCT 44.4 44.9  PLT 298 321   Recent Labs    11/16/17 1300 11/17/17 0422  NA 137 137  K 3.8 4.1  CL 103 104  CO2 24 25  GLUCOSE 134* 120*  BUN 11 10  CREATININE 0.54* 0.64  CALCIUM 8.1* 8.2*   No results for input(s): LABPROT, INR in the last 72 hours.  Imaging: No results found.  Assessment/Plan: This is a 57 y.o. male with gallstone pancreatitis.  --LFTs improving and lipase normalized this morning.  Patient's pain resolving. --For ERCP tomorrow with Dr. Allen Norris. --Depending on timing, may or may not have the OR or time availability to do his cholecystectomy during this admission.  If so, may have to follow as outpatient in office so we can schedule him for surgery in the future.   Melvyn Neth, [facility]

## 2017-11-18 ENCOUNTER — Inpatient Hospital Stay: Payer: BLUE CROSS/BLUE SHIELD | Admitting: Registered Nurse

## 2017-11-18 ENCOUNTER — Encounter
Admission: EM | Disposition: A | Discharge status: Home / Self Care | Payer: Self-pay | Source: Home / Self Care | Attending: Internal Medicine

## 2017-11-18 ENCOUNTER — Encounter: Payer: Self-pay | Admitting: Anesthesiology

## 2017-11-18 ENCOUNTER — Inpatient Hospital Stay: Payer: BLUE CROSS/BLUE SHIELD

## 2017-11-18 DIAGNOSIS — R748 Abnormal levels of other serum enzymes: Secondary | ICD-10-CM

## 2017-11-18 DIAGNOSIS — K8050 Calculus of bile duct without cholangitis or cholecystitis without obstruction: Secondary | ICD-10-CM

## 2017-11-18 DIAGNOSIS — R932 Abnormal findings on diagnostic imaging of liver and biliary tract: Secondary | ICD-10-CM

## 2017-11-18 HISTORY — PX: ERCP: SHX5425

## 2017-11-18 LAB — GLUCOSE, CAPILLARY
Glucose-Capillary: 150 mg/dL — ABNORMAL HIGH (ref 70–99)
Glucose-Capillary: 153 mg/dL — ABNORMAL HIGH (ref 70–99)
Glucose-Capillary: 204 mg/dL — ABNORMAL HIGH (ref 70–99)
Glucose-Capillary: 181 mg/dL — ABNORMAL HIGH (ref 70–99)

## 2017-11-18 SURGERY — ERCP, WITH INTERVENTION IF INDICATED
Anesthesia: General

## 2017-11-18 MED ORDER — DEXAMETHASONE SODIUM PHOSPHATE 10 MG/ML IJ SOLN
INTRAMUSCULAR | Status: AC
  Filled 2017-11-18: qty 1

## 2017-11-18 MED ORDER — PROPOFOL 10 MG/ML IV BOLUS
INTRAVENOUS | Status: AC
  Filled 2017-11-18: qty 20

## 2017-11-18 MED ORDER — MIDAZOLAM HCL 2 MG/2ML IJ SOLN
INTRAMUSCULAR | Status: DC | PRN
  Administered 2017-11-18: 2 mg via INTRAVENOUS

## 2017-11-18 MED ORDER — SUCCINYLCHOLINE CHLORIDE 20 MG/ML IJ SOLN
INTRAMUSCULAR | Status: DC | PRN
  Administered 2017-11-18: 160 mg via INTRAVENOUS

## 2017-11-18 MED ORDER — PROPOFOL 10 MG/ML IV BOLUS
INTRAVENOUS | Status: DC | PRN
  Administered 2017-11-18: 200 mg via INTRAVENOUS

## 2017-11-18 MED ORDER — FENTANYL CITRATE (PF) 100 MCG/2ML IJ SOLN
INTRAMUSCULAR | Status: AC
  Filled 2017-11-18: qty 2

## 2017-11-18 MED ORDER — LIDOCAINE HCL (PF) 2 % IJ SOLN
INTRAMUSCULAR | Status: AC
  Filled 2017-11-18: qty 10

## 2017-11-18 MED ORDER — ONDANSETRON HCL 4 MG/2ML IJ SOLN
INTRAMUSCULAR | Status: DC | PRN
  Administered 2017-11-18: 4 mg via INTRAVENOUS

## 2017-11-18 MED ORDER — ONDANSETRON HCL 4 MG/2ML IJ SOLN
INTRAMUSCULAR | Status: AC
  Filled 2017-11-18: qty 2

## 2017-11-18 MED ORDER — MIDAZOLAM HCL 2 MG/2ML IJ SOLN
INTRAMUSCULAR | Status: AC
  Filled 2017-11-18: qty 2

## 2017-11-18 MED ORDER — FENTANYL CITRATE (PF) 100 MCG/2ML IJ SOLN
INTRAMUSCULAR | Status: DC | PRN
  Administered 2017-11-18: 50 ug via INTRAVENOUS

## 2017-11-18 MED ORDER — INDOMETHACIN 50 MG RE SUPP
100.0000 mg | Freq: Once | RECTAL | Status: AC
  Administered 2017-11-18: 100 mg via RECTAL

## 2017-11-18 MED ORDER — INDOMETHACIN 50 MG RE SUPP
RECTAL | Status: AC
  Administered 2017-11-18: 100 mg via RECTAL
  Filled 2017-11-18: qty 2

## 2017-11-18 MED ORDER — FENTANYL CITRATE (PF) 100 MCG/2ML IJ SOLN
INTRAMUSCULAR | Status: AC
  Administered 2017-11-18: 25 ug via INTRAVENOUS
  Filled 2017-11-18: qty 2

## 2017-11-18 MED ORDER — PHENYLEPHRINE HCL 10 MG/ML IJ SOLN
INTRAMUSCULAR | Status: DC | PRN
  Administered 2017-11-18: 200 ug via INTRAVENOUS
  Administered 2017-11-18 (×3): 100 ug via INTRAVENOUS

## 2017-11-18 MED ORDER — DEXAMETHASONE SODIUM PHOSPHATE 10 MG/ML IJ SOLN
INTRAMUSCULAR | Status: DC | PRN
  Administered 2017-11-18: 10 mg via INTRAVENOUS

## 2017-11-18 MED ORDER — FENTANYL CITRATE (PF) 100 MCG/2ML IJ SOLN
25.0000 ug | INTRAMUSCULAR | Status: DC | PRN
  Administered 2017-11-18 (×2): 25 ug via INTRAVENOUS

## 2017-11-18 MED ORDER — LIDOCAINE HCL (CARDIAC) PF 100 MG/5ML IV SOSY
PREFILLED_SYRINGE | INTRAVENOUS | Status: DC | PRN
  Administered 2017-11-18: 100 mg via INTRAVENOUS

## 2017-11-18 NOTE — Anesthesia Preprocedure Evaluation (Signed)
Anesthesia Evaluation  Patient identified by MRN, date of birth, ID band Patient awake    Reviewed: Allergy & Precautions, H&P , NPO status , Patient's Chart, lab work & pertinent test results  History of Anesthesia Complications Negative for: history of anesthetic complications  Airway Mallampati: III  TM Distance: >3 FB Neck ROM: full    Dental  (+) Chipped, Poor Dentition   Pulmonary neg shortness of breath, sleep apnea and Continuous Positive Airway Pressure Ventilation ,           Cardiovascular Exercise Tolerance: Good hypertension, (-) angina(-) Past MI and (-) DOE      Neuro/Psych negative neurological ROS  negative psych ROS   GI/Hepatic Neg liver ROS, GERD  Medicated and Controlled,  Endo/Other  diabetes, Type 2  Renal/GU      Musculoskeletal  (+) Arthritis ,   Abdominal   Peds  Hematology negative hematology ROS (+)   Anesthesia Other Findings Past Medical History: No date: Arthritis No date: GERD (gastroesophageal reflux disease) No date: Hypertension No date: Obesity No date: Sleep apnea     Comment:  cpap  Past Surgical History: No date: BACK SURGERY     Comment:  x2 05/24/2014: COLONOSCOPY; N/A     Comment:  Procedure: COLONOSCOPY;  Surgeon: Hulen Luster, MD;                Location: [facility] ENDOSCOPY;  Service: Gastroenterology;                Laterality: N/A; No date: ELBOW SURGERY; Right     Comment:  tennis elbow 05/24/2014: ESOPHAGOGASTRODUODENOSCOPY; N/A     Comment:  Procedure: ESOPHAGOGASTRODUODENOSCOPY (EGD);  Surgeon:               Hulen Luster, MD;  Location: [facility] ENDOSCOPY;  Service:               Gastroenterology;  Laterality: N/A; No date: PROSTATE SURGERY No date: SPINE SURGERY     Comment:  l-spine x 2  BMI    Body Mass Index:  47.21 kg/m      Reproductive/Obstetrics negative OB ROS                             Anesthesia Physical Anesthesia  Plan  ASA: III  Anesthesia Plan: General ETT   Post-op Pain Management:    Induction: Intravenous  PONV Risk Score and Plan: Ondansetron, Dexamethasone, Midazolam and Treatment may vary due to age or medical condition  Airway Management Planned: Oral ETT  Additional Equipment:   Intra-op Plan:   Post-operative Plan: Extubation in OR  Informed Consent: I have reviewed the patients History and Physical, chart, labs and discussed the procedure including the risks, benefits and alternatives for the proposed anesthesia with the patient or authorized representative who has indicated his/her understanding and acceptance.   Dental Advisory Given  Plan Discussed with: Anesthesiologist, CRNA and Surgeon  Anesthesia Plan Comments: (Patient consented for risks of anesthesia including but not limited to:  - adverse reactions to medications - damage to teeth, lips or other oral mucosa - sore throat or hoarseness - Damage to heart, brain, lungs or loss of life  Patient voiced understanding.)        Anesthesia Quick Evaluation

## 2017-11-18 NOTE — Anesthesia Postprocedure Evaluation (Signed)
Anesthesia Post Note  Patient: Mike Patel  Procedure(s) Performed: ENDOSCOPIC RETROGRADE CHOLANGIOPANCREATOGRAPHY (ERCP) (N/A )  Patient location during evaluation: Endoscopy Anesthesia Type: General Level of consciousness: awake and alert Pain management: pain level controlled Vital Signs Assessment: post-procedure vital signs reviewed and stable Respiratory status: spontaneous breathing, nonlabored ventilation, respiratory function stable and patient connected to nasal cannula oxygen Cardiovascular status: blood pressure returned to baseline and stable Postop Assessment: no apparent nausea or vomiting Anesthetic complications: no     Last Vitals:  Vitals:   11/18/17 1315 11/18/17 1329  BP: 117/75 115/75  Pulse: 67 63  Resp: 10 20  Temp: (!) 36.4 C 36.7 C  SpO2: 98% 96%    Last Pain:  Vitals:   11/18/17 1329  TempSrc: Oral  PainSc:                  [name]

## 2017-11-18 NOTE — Progress Notes (Signed)
[place] [facility] [place] NAME: Mike Patel    MR#:  [id number]  DATE OF BIRTH:  07-23-1960  SUBJECTIVE:   Doing well this morning.  States that he does not have any abdominal pain except for "hunger pains".  No nausea or vomiting.  REVIEW OF SYSTEMS:    Review of Systems  Constitutional: Negative for fever, chills weight loss HENT: Negative for ear pain, nosebleeds, congestion, facial swelling, rhinorrhea, neck pain, neck stiffness and ear discharge.   Respiratory: Negative for cough, shortness of breath, wheezing  Cardiovascular: Negative for chest pain, palpitations and leg swelling.  Gastrointestinal: Negative for heartburn, denies abdominal pain, no vomiting, diarrhea or consitpation Genitourinary: Negative for dysuria, urgency, frequency, hematuria Musculoskeletal: Negative for back pain or joint pain Neurological: Negative for dizziness, seizures, syncope, focal weakness,  numbness and headaches.  Hematological: Does not bruise/bleed easily.  Psychiatric/Behavioral: Negative for hallucinations, confusion, dysphoric mood  Tolerating Diet: npo  DRUG ALLERGIES:   Allergies  Allergen Reactions  . Percocet [Oxycodone-Acetaminophen] Itching    VITALS:  Blood pressure 115/75, pulse 63, temperature 98 F (36.7 C), temperature source Oral, resp. rate 20, height 5\' 9"  (1.753 m), weight (!) 145 kg, SpO2 96 %.  PHYSICAL EXAMINATION:  Constitutional: Appears well-developed and well-nourished. No distress. HENT: Normocephalic.  Atraumatic. Oropharynx is clear and moist.  Eyes: Conjunctivae and EOM are normal. PERRLA, no scleral icterus.  Neck: Normal ROM. Neck supple. No JVD. No tracheal deviation. CVS: RRR, S1/S2 +, no murmurs, no gallops, no carotid bruit.  Pulmonary: Effort and breath sounds normal, no stridor, rhonchi, wheezes, rales.  Abdominal: Soft. BS +,  no distension, no tenderness, no rebound or guarding.  Musculoskeletal: Normal  range of motion. No edema and no tenderness.  Neuro: Alert. CN 2-12 grossly intact. No focal deficits. Skin: Skin is warm and dry. No rash noted. Psychiatric: Normal mood and affect.   LABORATORY PANEL:   CBC Recent Labs  Lab 11/17/17 0422  WBC 6.2  HGB 14.8  HCT 44.9  PLT 321   ------------------------------------------------------------------------------------------------------------------  Chemistries  Recent Labs  Lab 11/17/17 0422  NA 137  K 4.1  CL 104  CO2 25  GLUCOSE 120*  BUN 10  CREATININE 0.64  CALCIUM 8.2*  AST 67*  ALT 191*  ALKPHOS 159*  BILITOT 3.3*   ------------------------------------------------------------------------------------------------------------------  Cardiac Enzymes Recent Labs  Lab 11/15/17 1954  TROPONINI <0.03   ------------------------------------------------------------------------------------------------------------------  RADIOLOGY:  Dg C-arm 1-60 Min-no Report  Result Date: 11/18/2017 Fluoroscopy was utilized by the requesting physician.  No radiographic interpretation.     ASSESSMENT AND PLAN:   57 year old male with history of sleep apnea who presents to the emergency room due to epigastric abdominal pain.   1.  Acute gallstone pancreatitis: MRCP confirms gallstone pancreatitis -Plan for ERCP today -Inpatient versus outpatient cholecystectomy- defer to surgery -NPO for now can start clear liquids after ERCP  2.  Essential hypertension: Continue Norvasc and metoprolol  3.  Obesity: Encouraged weight loss as tolerated  4.  B12 deficiency-receiving injections every 30 days  5.  Sleep apnea: CPAP at night  Management plans discussed with the patient and wife and they are in agreement.  CODE STATUS: FULL  TOTAL TIME TAKING CARE OF THIS PATIENT: 33 minutes.   POSSIBLE D/C 1-2 days, DEPENDING ON CLINICAL CONDITION.   Berna Spare Takya Vandivier M.D on 11/18/2017 at 4:04 PM  Between 7am to 6pm - Pager [phone] After 6pm go to [email or web address] -  password [email or web address] [facility]  Office  [phone]  CC: Primary care physician; [place] Pink, MD  Note: This dictation was prepared with Dragon dictation along with smaller phrase technology. Any transcriptional errors that result from this process are unintentional.

## 2017-11-18 NOTE — Op Note (Signed)
[facility] Gastroenterology Patient Name: Mike Patel Procedure Date: 11/18/2017 11:41 AM MRN: [id number] Account #: [id number] Date of Birth: 26-May-1960 Admit Type: Inpatient Age: 57 Room: [facility] ENDO ROOM 4 Gender: Male Note Status: Finalized Procedure:            ERCP Indications:          Bile duct stone(s), Elevated liver enzymes Providers:            Lucilla Lame MD, MD Medicines:            General Anesthesia Complications:        No immediate complications. Procedure:            Pre-Anesthesia Assessment:                       - Prior to the procedure, a History and Physical was                        performed, and patient medications and allergies were                        reviewed. The patient's tolerance of previous                        anesthesia was also reviewed. The risks and benefits of                        the procedure and the sedation options and risks were                        discussed with the patient. All questions were                        answered, and informed consent was obtained. Prior                        Anticoagulants: The patient has taken no previous                        anticoagulant or antiplatelet agents. ASA Grade                        Assessment: II - A patient with mild systemic disease.                        After reviewing the risks and benefits, the patient was                        deemed in satisfactory condition to undergo the                        procedure.                       After obtaining informed consent, the scope was passed                        under direct vision. Throughout the procedure, the                        patient's blood pressure,  pulse, and oxygen saturations                        were monitored continuously. The Duodenoscope was                        introduced through the mouth, and used to inject                        contrast into and used to inject contrast into the  bile                        duct. The ERCP was accomplished without difficulty. The                        patient tolerated the procedure well. Findings:      The scout film was normal. The esophagus was successfully intubated       under direct vision. The scope was advanced to a normal major papilla in       the descending duodenum without detailed examination of the pharynx,       larynx and associated structures, and upper GI tract. The upper GI tract       was grossly normal. The bile duct was deeply cannulated with the       short-nosed traction sphincterotome. Contrast was injected. I personally       interpreted the bile duct images. There was brisk flow of contrast       through the ducts. Image quality was excellent. Contrast extended to the       entire biliary tree. The common bile duct contained filling defect(s)       thought to be a stone. A wire was passed into the biliary tree. An 8 mm       biliary sphincterotomy was made with a traction (standard)       sphincterotome using ERBE electrocautery. There was no       post-sphincterotomy bleeding. The biliary tree was swept with a 15 mm       balloon starting at the bifurcation. Three stones were removed. No       stones remained. Impression:           - A filling defect consistent with a stone was seen on                        the cholangiogram.                       - Choledocholithiasis was found. Complete removal was                        accomplished by biliary sphincterotomy and balloon                        extraction.                       - A biliary sphincterotomy was performed.                       - The biliary tree was swept. Recommendation:       - Return patient to hospital ward for ongoing care.                       -  Clear liquid diet.                       - Watch for pancreatitis, bleeding, perforation, and                        cholangitis. Procedure Code(s):    --- Professional ---                        [phone], Endoscopic retrograde cholangiopancreatography                        (ERCP); with removal of calculi/debris from                        biliary/pancreatic duct(s)                       [id number], Endoscopic retrograde cholangiopancreatography                        (ERCP); with sphincterotomy/papillotomy                       [phone], Endoscopic catheterization of the biliary ductal                        system, radiological supervision and interpretation Diagnosis Code(s):    --- Professional ---                       K80.50, Calculus of bile duct without cholangitis or                        cholecystitis without obstruction                       R74.8, Abnormal levels of other serum enzymes                       R93.2, Abnormal findings on diagnostic imaging of liver                        and biliary tract CPT copyright 2018 [employer]. All rights reserved. The codes documented in this report are preliminary and upon coder review may  be revised to meet current compliance requirements. Lucilla Lame MD, MD 11/18/2017 26:20:35 PM This report has been signed electronically. Number of Addenda: 0 Note Initiated On: 11/18/2017 11:41 AM      [facility]

## 2017-11-18 NOTE — Progress Notes (Signed)
11/18/2017  Subjective: No acute events.  Patient had ERCP today and a stone was found in CBD and removed.  Patient feeling well without any evident complications at the moment.  Vital signs: Temp:  [97 F (36.1 C)-98 F (36.7 C)] 98 F (36.7 C) (10/28 1329) Pulse Rate:  [58-71] 63 (10/28 1329) Resp:  [10-20] 20 (10/28 1329) BP: (115-150)/(69-86) 115/75 (10/28 1329) SpO2:  [96 %-100 %] 96 % (10/28 1329) Weight:  [140.5 kg-145 kg] 145 kg (10/28 1045)   Intake/Output: 10/27 0701 - 10/28 0700 In: 3514.5 [I.V.:3514.5] Out: -  Last BM Date: 11/17/17  Physical Exam: Constitutional: No acute distress Abdomen:  Soft, nondistended, nontender.  Labs:  Recent Labs    11/16/17 0720 11/17/17 0422  WBC 8.9 6.2  HGB 14.5 14.8  HCT 44.4 44.9  PLT 298 321   Recent Labs    11/16/17 1300 11/17/17 0422  NA 137 137  K 3.8 4.1  CL 103 104  CO2 24 25  GLUCOSE 134* 120*  BUN 11 10  CREATININE 0.54* 0.64  CALCIUM 8.1* 8.2*   No results for input(s): LABPROT, INR in the last 72 hours.  Imaging: Dg C-arm 1-60 Min-no Report  Result Date: 11/18/2017 Fluoroscopy was utilized by the requesting physician.  No radiographic interpretation.    Assessment/Plan: This is a 57 y.o. male with gallstone pancreatitis, now s/p ERCP.  --discussed with patient again that at this point there is no OR availability and I do not have the time to be able to add his case for the next few days.  Will still plan on having him come to office next week so we can discuss surgery and schedule him. --may discharge to home tomorrow if no complications.   Melvyn Neth, [facility]

## 2017-11-18 NOTE — Transfer of Care (Signed)
Immediate Anesthesia Transfer of Care Note  Patient: Mike Patel  Procedure(s) Performed: ENDOSCOPIC RETROGRADE CHOLANGIOPANCREATOGRAPHY (ERCP) (N/A )  Patient Location: PACU  Anesthesia Type:General  Level of Consciousness: awake, alert , oriented and patient cooperative  Airway & Oxygen Therapy: Patient Spontanous Breathing and Patient connected to nasal cannula oxygen  Post-op Assessment: Report given to RN and Post -op Vital signs reviewed and stable  Post vital signs: Reviewed and stable  Last Vitals:  Vitals Value Taken Time  BP    Temp    Pulse    Resp    SpO2      Last Pain:  Vitals:   11/18/17 1045  TempSrc: Tympanic  PainSc: 0-No pain      Patients Stated Pain Goal: 0 (99/87/21 [id number])  Complications: No apparent anesthesia complications

## 2017-11-18 NOTE — Anesthesia Post-op Follow-up Note (Signed)
Anesthesia QCDR form completed.        

## 2017-11-18 NOTE — Anesthesia Procedure Notes (Signed)
Procedure Name: Intubation Date/Time: 11/18/2017 11:57 AM Performed by: Eben Burow, CRNA Pre-anesthesia Checklist: Patient identified, Emergency Drugs available, Suction available, Patient being monitored and Timeout performed Patient Re-evaluated:Patient Re-evaluated prior to induction Oxygen Delivery Method: Circle system utilized Preoxygenation: Pre-oxygenation with 100% oxygen Induction Type: IV induction Ventilation: Mask ventilation without difficulty Laryngoscope Size: Miller and 2 Grade View: Grade I Tube type: Oral Tube size: 8.0 mm Number of attempts: 1 Airway Equipment and Method: Stylet Placement Confirmation: ETT inserted through vocal cords under direct vision,  positive ETCO2 and breath sounds checked- equal and bilateral Secured at: 24 cm Tube secured with: Tape Dental Injury: Teeth and Oropharynx as per pre-operative assessment

## 2017-11-19 ENCOUNTER — Encounter: Payer: Self-pay | Admitting: Gastroenterology

## 2017-11-19 LAB — CBC
HEMATOCRIT: 48.4 % (ref 39.0–52.0)
HEMOGLOBIN: 15.6 g/dL (ref 13.0–17.0)
MCH: 30.3 pg (ref 26.0–34.0)
MCHC: 32.2 g/dL (ref 30.0–36.0)
MCV: 94 fL (ref 80.0–100.0)
NRBC: 0 % (ref 0.0–0.2)
PLATELETS: 402 10*3/uL — AB (ref 150–400)
RBC: 5.15 MIL/uL (ref 4.22–5.81)
RDW: 12.7 % (ref 11.5–15.5)
WBC: 8.6 10*3/uL (ref 4.0–10.5)

## 2017-11-19 LAB — COMPREHENSIVE METABOLIC PANEL
ALT: 126 U/L — ABNORMAL HIGH (ref 0–44)
ANION GAP: 10 (ref 5–15)
AST: 32 U/L (ref 15–41)
Albumin: 3.6 g/dL (ref 3.5–5.0)
Alkaline Phosphatase: 147 U/L — ABNORMAL HIGH (ref 38–126)
BUN: 11 mg/dL (ref 6–20)
CHLORIDE: 103 mmol/L (ref 98–111)
CO2: 28 mmol/L (ref 22–32)
Calcium: 8.6 mg/dL — ABNORMAL LOW (ref 8.9–10.3)
Creatinine, Ser: 0.89 mg/dL (ref 0.61–1.24)
GFR calc non Af Amer: >60 mL/min (ref >60)
Glucose, Bld: 150 mg/dL — ABNORMAL HIGH (ref 70–99)
POTASSIUM: 4.6 mmol/L (ref 3.5–5.1)
SODIUM: 141 mmol/L (ref 135–145)
Total Bilirubin: 1.7 mg/dL — ABNORMAL HIGH (ref 0.3–1.2)
Total Protein: 7.4 g/dL (ref 6.5–8.1)

## 2017-11-19 LAB — HIV ANTIBODY (ROUTINE TESTING W REFLEX): HIV Screen 4th Generation wRfx: NONREACTIVE

## 2017-11-19 LAB — GLUCOSE, CAPILLARY: Glucose-Capillary: 117 mg/dL — ABNORMAL HIGH (ref 70–99)

## 2017-11-19 MED ORDER — LISINOPRIL 40 MG PO TABS: 40.0000 mg | ORAL_TABLET | Freq: Every day | ORAL | 0 refills | Status: DC

## 2017-11-19 NOTE — Discharge Instructions (Signed)
It was so nice to meet you during this hospitalization!  You had gallstones, which caused a pancreatitis. You need to have your gallbladder removed to prevent this from happening in the future.  Please make sure you follow-up with the surgeon in clinic.  Also, please try to eat a low fat diet at home (do not eat any fried foods, greasy foods, ice cream, high fat milk/cheese).  -Dr. Brett Albino

## 2017-11-19 NOTE — Progress Notes (Signed)
Pt refuses to wear CPAP. No distress noted.

## 2017-11-19 NOTE — Discharge Summary (Signed)
[facility] NAME: Mike Patel    MR#:  [id number]  DATE OF BIRTH:  Dec 23, 1960  DATE OF ADMISSION:  11/15/2017   ADMITTING PHYSICIAN: Amelia Jo, MD  DATE OF DISCHARGE: 11/19/2017 10:45 AM  PRIMARY CARE PHYSICIAN: Maryland Pink, MD   ADMISSION DIAGNOSIS:  Hyperbilirubinemia [E80.6] Lower abdominal pain [R10.30] Acute pancreatitis, unspecified complication status, unspecified pancreatitis type [K85.90] DISCHARGE DIAGNOSIS:  Active Problems:   Acute gallstone pancreatitis   Calculus of common duct   Elevated liver enzymes   Abnormal findings on diagnostic imaging of liver  SECONDARY DIAGNOSIS:   Past Medical History:  Diagnosis Date  . Arthritis   . GERD (gastroesophageal reflux disease)   . Hypertension   . Obesity   . Sleep apnea    cpap   HOSPITAL COURSE:   Mike Patel is a 57 year old male who presented to the ED with severe epigastric and right upper quadrant abdominal pain for 2 to 3 days.  Initial testing showed elevated lipase to 1000 elevated total bili to 5.  AST, ALT, and alk phos were also elevated.  Abdominal ultrasound did not show any gallstones.  MRCP was performed and confirmed gallstone pancreatitis.  He underwent ERCP on 11/19/17, which showed choledocholithiasis.  Biliary sphincterotomy and 3 stone removal was performed.  He did not have any complications after his procedure.  Liver function tests decreased after ERCP.  He was seen by surgery, who recommended outpatient cholecystectomy.  Follow-up appointment with surgery was scheduled prior to discharge.  DISCHARGE CONDITIONS:  Acute gallstone pancreatitis Hypertension Obesity B12 deficiency Obstructive sleep apnea CONSULTS OBTAINED:  Treatment Team:  Lin Landsman, MD Olean Ree, MD DRUG ALLERGIES:   Allergies  Allergen Reactions  . Percocet [Oxycodone-Acetaminophen] Itching   DISCHARGE MEDICATIONS:   Allergies as of 11/19/2017        Reactions   Percocet [oxycodone-acetaminophen] Itching      Medication List    STOP taking these medications   diazepam 5 MG tablet Commonly known as:  VALIUM   meloxicam 15 MG tablet Commonly known as:  MOBIC   oseltamivir 75 MG capsule Commonly known as:  TAMIFLU   oxyCODONE-acetaminophen 5-325 MG tablet Commonly known as:  PERCOCET/ROXICET     TAKE these medications   amLODipine 10 MG tablet Commonly known as:  NORVASC Take 10 mg by mouth daily.   aspirin 81 MG tablet Take 81 mg by mouth daily.   cyanocobalamin 1000 MCG/ML injection Commonly known as:  (VITAMIN B-12) Inject 1 mL into the muscle every 30 (thirty) days.   lansoprazole 30 MG capsule Commonly known as:  PREVACID Take 30 mg by mouth every morning.   lisinopril 40 MG tablet Commonly known as:  PRINIVIL,ZESTRIL Take 1 tablet (40 mg total) by mouth daily. What changed:  when to take this   metoprolol succinate 50 MG 24 hr tablet Commonly known as:  TOPROL-XL Take 50 mg by mouth daily. Take with or immediately following a meal.   tadalafil 5 MG tablet Commonly known as:  CIALIS Take 1 tablet by mouth as needed.   testosterone cypionate 200 MG/ML injection Commonly known as:  DEPOTESTOSTERONE CYPIONATE Inject 1 mL into the muscle every 14 (fourteen) days.        DISCHARGE INSTRUCTIONS:  1.  Follow-up with PCP in 1 to 2 weeks 2.  Follow-up with surgery for cholecystectomy DIET:  Cardiac diet DISCHARGE CONDITION:  Stable ACTIVITY:  Activity as tolerated OXYGEN:  Home  Oxygen: No.  Oxygen Delivery: room air DISCHARGE LOCATION:  home   If you experience worsening of your admission symptoms, develop shortness of breath, life threatening emergency, suicidal or homicidal thoughts you must seek medical attention immediately by calling 911 or calling your MD immediately  if symptoms less severe.  You Must read complete instructions/literature along with all the possible adverse  reactions/side effects for all the Medicines you take and that have been prescribed to you. Take any new Medicines after you have completely understood and accpet all the possible adverse reactions/side effects.   Please note  You were cared for by a hospitalist during your hospital stay. If you have any questions about your discharge medications or the care you received while you were in the hospital after you are discharged, you can call the unit and asked to speak with the hospitalist on call if the hospitalist that took care of you is not available. Once you are discharged, your primary care physician will handle any further medical issues. Please note that NO REFILLS for any discharge medications will be authorized once you are discharged, as it is imperative that you return to your primary care physician (or establish a relationship with a primary care physician if you do not have one) for your aftercare needs so that they can reassess your need for medications and monitor your lab values.    On the day of Discharge:  VITAL SIGNS:  Blood pressure 129/64, pulse 72, temperature 98.1 F (36.7 C), temperature source Oral, resp. rate 18, height _0  (1.753 m), weight (!) 139.5 kg, SpO2 95 %. PHYSICAL EXAMINATION:  GENERAL:  57 y.o.-year-old patient lying in the bed with no acute distress.  EYES: Pupils equal, round, reactive to light and accommodation. No scleral icterus. Extraocular muscles intact.  HEENT: Head atraumatic, normocephalic. Oropharynx and nasopharynx clear.  NECK:  Supple, no jugular venous distention. No thyroid enlargement, no tenderness.  LUNGS: Normal breath sounds bilaterally, no wheezing, rales,rhonchi or crepitation. No use of accessory muscles of respiration.  CARDIOVASCULAR: S1, S2 normal. No murmurs, rubs, or gallops.  ABDOMEN: Soft, non-tender, non-distended. Bowel sounds present. No organomegaly or mass.  EXTREMITIES: No pedal edema, cyanosis, or clubbing.    NEUROLOGIC: Cranial nerves II through XII are intact. Muscle strength 5/5 in all extremities. Sensation intact. Gait not checked.  PSYCHIATRIC: The patient is alert and oriented x 3.  SKIN: No obvious rash, lesion, or ulcer.  DATA REVIEW:   CBC Recent Labs  Lab 11/19/17 0411  WBC 8.6  HGB 15.6  HCT 48.4  PLT 402*    Chemistries  Recent Labs  Lab 11/19/17 0411  NA 141  K 4.6  CL 103  CO2 28  GLUCOSE 150*  BUN 11  CREATININE 0.89  CALCIUM 8.6*  AST 32  ALT 126*  ALKPHOS 147*  BILITOT 1.7*     Microbiology Results  Results for orders placed or performed during the hospital encounter of 04/30/16  Surgical pcr screen     Status: Abnormal   Collection Time: 04/30/16 10:13 AM  Result Value Ref Range Status   MRSA, PCR NEGATIVE NEGATIVE Final   Staphylococcus aureus POSITIVE (A) NEGATIVE Final    Comment:        The Xpert SA Assay (FDA approved for NASAL specimens in patients over 41 years of age), is one component of a comprehensive surveillance program.  Test performance has been validated by [facility] for patients greater than or equal to 77 year old.  It is not intended to diagnose infection nor to guide or monitor treatment.     RADIOLOGY:  No results found.   Management plans discussed with the patient, family and they are in agreement.  CODE STATUS: Prior   TOTAL TIME TAKING CARE OF THIS PATIENT: 37 minutes.    Berna Spare Loetta Connelley M.D on 11/19/2017 at 2:20 PM  Between 7am to 6pm - Pager [phone]  After 6pm go to [email or web address] - Proofreader  [facility]  Office  [phone]  CC: Primary care physician; Maryland Pink, MD   Note: This dictation was prepared with Dragon dictation along with smaller phrase technology. Any transcriptional errors that result from this process are unintentional.

## 2017-11-19 NOTE — Progress Notes (Signed)
Discharge order received. Patient is alert and oriented. Vital signs stable . No signs of acute distress. Discharge instructions given. Patient verbalized understanding. No other issues noted at this time.   

## 2017-11-26 ENCOUNTER — Ambulatory Visit (INDEPENDENT_AMBULATORY_CARE_PROVIDER_SITE_OTHER): Payer: BLUE CROSS/BLUE SHIELD | Admitting: Surgery

## 2017-11-26 ENCOUNTER — Other Ambulatory Visit: Payer: Self-pay

## 2017-11-26 ENCOUNTER — Encounter: Payer: Self-pay | Admitting: Surgery

## 2017-11-26 VITALS — BP 152/82 | HR 72 | Temp 96.3°F | Ht 65.0 in | Wt 319.0 lb

## 2017-11-26 DIAGNOSIS — K851 Biliary acute pancreatitis without necrosis or infection: Secondary | ICD-10-CM

## 2017-11-26 DIAGNOSIS — M7060 Trochanteric bursitis, unspecified hip: Secondary | ICD-10-CM | POA: Insufficient documentation

## 2017-11-26 DIAGNOSIS — M109 Gout, unspecified: Secondary | ICD-10-CM | POA: Insufficient documentation

## 2017-11-26 DIAGNOSIS — K219 Gastro-esophageal reflux disease without esophagitis: Secondary | ICD-10-CM | POA: Insufficient documentation

## 2017-11-26 DIAGNOSIS — Z8739 Personal history of other diseases of the musculoskeletal system and connective tissue: Secondary | ICD-10-CM | POA: Insufficient documentation

## 2017-11-26 NOTE — Patient Instructions (Addendum)
You have requested to have your gallbladder removed. This will be done 12/02/17 at [facility] with Dr. Olean Ree.  You will most likely be out of work 1-2 weeks for this surgery. You will return after your post-op appointment with a lifting restriction for approximately 4 more weeks.  You will be able to eat anything you would like to following surgery. But, start by eating a bland diet and advance this as tolerated. The Gallbladder diet is below, please go as closely by this diet as possible prior to surgery to avoid any further attacks.    Laparoscopic Cholecystectomy Laparoscopic cholecystectomy is surgery to remove the gallbladder. The gallbladder is located in the upper right part of the abdomen, behind the liver. It is a storage sac for bile, which is produced in the liver. Bile aids in the digestion and absorption of fats. Cholecystectomy is often done for inflammation of the gallbladder (cholecystitis). This condition is usually caused by a buildup of gallstones (cholelithiasis) in the gallbladder. Gallstones can block the flow of bile, and that can result in inflammation and pain. In severe cases, emergency surgery may be required. If emergency surgery is not required, you will have time to prepare for the procedure. Laparoscopic surgery is an alternative to open surgery. Laparoscopic surgery has a shorter recovery time. Your common bile duct may also need to be examined during the procedure. If stones are found in the common bile duct, they may be removed. LET [facility] CARE PROVIDER KNOW ABOUT:  Any allergies you have.  All medicines you are taking, including vitamins, herbs, eye drops, creams, and over-the-counter medicines.  Previous problems you or members of your family have had with the use of anesthetics.  Any blood disorders you have.  Previous surgeries you have had.    Any medical conditions you have. RISKS AND COMPLICATIONS Generally, this is a safe  procedure. However, problems may occur, including:  Infection.  Bleeding.  Allergic reactions to medicines.  Damage to other structures or organs.  A stone remaining in the common bile duct.  A bile leak from the cyst duct that is clipped when your gallbladder is removed.  The need to convert to open surgery, which requires a larger incision in the abdomen. This may be necessary if your surgeon thinks that it is not safe to continue with a laparoscopic procedure. BEFORE THE PROCEDURE  Ask your health care provider about:  Changing or stopping your regular medicines. This is especially important if you are taking diabetes medicines or blood thinners.  Taking medicines such as aspirin and ibuprofen. These medicines can thin your blood. Do not take these medicines before your procedure if your health care provider instructs you not to.  Follow instructions from your health care provider about eating or drinking restrictions.  Let your health care provider know if you develop a cold or an infection before surgery.  Plan to have someone take you home after the procedure.  Ask your health care provider how your surgical site will be marked or identified.  You may be given antibiotic medicine to help prevent infection. PROCEDURE  To reduce your risk of infection:  Your health care team will wash or sanitize their hands.  Your skin will be washed with soap.  An IV tube may be inserted into one of your veins.  You will be given a medicine to make you fall asleep (general anesthetic).  A breathing tube will be placed in your mouth.  The surgeon will  make several small cuts (incisions) in your abdomen.  A thin, lighted tube (laparoscope) that has a tiny camera on the end will be inserted through one of the small incisions. The camera on the laparoscope will send a picture to a TV screen (monitor) in the operating room. This will give the surgeon a good view inside your  abdomen.  A gas will be pumped into your abdomen. This will expand your abdomen to give the surgeon more room to perform the surgery.  Other tools that are needed for the procedure will be inserted through the other incisions. The gallbladder will be removed through one of the incisions.  After your gallbladder has been removed, the incisions will be closed with stitches (sutures), staples, or skin glue.  Your incisions may be covered with a bandage (dressing). The procedure may vary among health care providers and hospitals. AFTER THE PROCEDURE  Your blood pressure, heart rate, breathing rate, and blood oxygen level will be monitored often until the medicines you were given have worn off.  You will be given medicines as needed to control your pain.   This information is not intended to replace advice given to you by your health care provider. Make sure you discuss any questions you have with your health care provider.   Document Released: 01/08/2005 Document Revised: 09/29/2014 Document Reviewed: 08/20/2012 Elsevier Interactive Patient Education 2016 [place] Diet for Gallbladder Conditions A low-fat diet can be helpful if you have pancreatitis or a gallbladder condition. With these conditions, your pancreas and gallbladder have trouble digesting fats. A healthy eating plan with less fat will help rest your pancreas and gallbladder and reduce your symptoms. WHAT DO I NEED TO KNOW ABOUT THIS DIET?  Eat a low-fat diet.  Reduce your fat intake to less than 20-30% of your total daily calories. This is less than 50-60 g of fat per day.  Remember that you need some fat in your diet. Ask your dietician what your daily goal should be.  Choose nonfat and low-fat healthy foods. Look for the words "nonfat," "low fat," or "fat free."  As a guide, look on the label and choose foods with less than 3 g of fat per serving. Eat only one serving.  Avoid alcohol.  Do not smoke. If  you need help quitting, talk with your health care provider.  Eat small frequent meals instead of three large heavy meals. WHAT FOODS CAN I EAT? Grains Include healthy grains and starches such as potatoes, wheat bread, fiber-rich cereal, and brown rice. Choose whole grain options whenever possible. In adults, whole grains should account for 45-65% of your daily calories.  Fruits and Vegetables Eat plenty of fruits and vegetables. Fresh fruits and vegetables add fiber to your diet. Meats and Other Protein Sources Eat lean meat such as chicken and pork. Trim any fat off of meat before cooking it. Eggs, fish, and beans are other sources of protein. In adults, these foods should account for 10-35% of your daily calories. Dairy Choose low-fat milk and dairy options. Dairy includes fat and protein, as well as calcium.  Fats and Oils Limit high-fat foods such as fried foods, sweets, baked goods, sugary drinks.  Other Creamy sauces and condiments, such as mayonnaise, can add extra fat. Think about whether or not you need to use them, or use smaller amounts or low fat options. WHAT FOODS ARE NOT RECOMMENDED?  High fat foods, such as:  [employer].  Ice cream.  [place]  toast.  Sweet rolls.  Pizza.  Cheese bread.  Foods covered with batter, butter, creamy sauces, or cheese.  Fried foods.  Sugary drinks and desserts.  Foods that cause gas or bloating   This information is not intended to replace advice given to you by your health care provider. Make sure you discuss any questions you have with your health care provider.   Document Released: 01/13/2013 Document Reviewed: 01/13/2013 Elsevier Interactive Patient Education [employer].

## 2017-11-26 NOTE — H&P (View-Only) (Signed)
11/26/2017  History of Present Illness: Mike Patel is a 57 y.o. male who was recently admitted with acute gallstone pancreatitis, s/p ERCP with Dr. Allen Norris on 10/28.  Three stones were removed from his CBD and a sphincterotomy was done.  He was discharged the next day home, as unfortunately there was no OR time availability to do his cholecystectomy during the same admission.  He presents today for follow up and to discuss surgery.  He denies any abdominal pain, nausea, or vomiting.  Denies any jaundice like before his admission.  He has been maintaining a low fat diet.  Past Medical History: Past Medical History:  Diagnosis Date  . Arthritis   . GERD (gastroesophageal reflux disease)   . Hypertension   . Obesity   . Sleep apnea    cpap     Past Surgical History: Past Surgical History:  Procedure Laterality Date  . BACK SURGERY     x2  . COLONOSCOPY N/A 05/24/2014   Procedure: COLONOSCOPY;  Surgeon: Hulen Luster, MD;  Location: [facility] ENDOSCOPY;  Service: Gastroenterology;  Laterality: N/A;  . ELBOW SURGERY Right    tennis elbow  . ERCP N/A 11/18/2017   Procedure: ENDOSCOPIC RETROGRADE CHOLANGIOPANCREATOGRAPHY (ERCP);  Surgeon: Lucilla Lame, MD;  Location: [facility] ENDOSCOPY;  Service: Endoscopy;  Laterality: N/A;  . ESOPHAGOGASTRODUODENOSCOPY N/A 05/24/2014   Procedure: ESOPHAGOGASTRODUODENOSCOPY (EGD);  Surgeon: Hulen Luster, MD;  Location: [facility] ENDOSCOPY;  Service: Gastroenterology;  Laterality: N/A;  . PROSTATE SURGERY    . SPINE SURGERY     l-spine x 2    Home Medications: Prior to Admission medications   Medication Sig Start Date End Date Taking? Authorizing Provider  amLODipine (NORVASC) 10 MG tablet Take 10 mg by mouth daily.   Yes [provider]  aspirin 81 MG tablet Take 81 mg by mouth daily.   Yes [provider]  cyanocobalamin (,VITAMIN B-12,) 1000 MCG/ML injection Inject 1 mL into the muscle every 30 (thirty) days. 10/25/17  Yes [provider]   Cyanocobalamin 1000 MCG/ML KIT cyanocobalamin (vit B-12) 1,000 mcg/mL injection solution  INJECT 1 ML (1,000 MCG TOTAL) INTO THE MUSCLE MONTHLY 08/14/17  Yes [provider]  lansoprazole (PREVACID) 30 MG capsule Take 30 mg by mouth every morning.    Yes [provider]  lisinopril (PRINIVIL,ZESTRIL) 40 MG tablet Take 1 tablet (40 mg total) by mouth daily. 11/19/17  Yes Mayo, Pete Pelt, MD  metoprolol succinate (TOPROL-XL) 50 MG 24 hr tablet Take 50 mg by mouth daily. Take with or immediately following a meal.   Yes [provider]  tadalafil (CIALIS) 5 MG tablet Take 1 tablet by mouth as needed. 11/05/17  Yes [provider]  testosterone cypionate (DEPOTESTOSTERONE CYPIONATE) 200 MG/ML injection Inject 1 mL into the muscle every 14 (fourteen) days. 10/25/17  Yes [provider]    Allergies: Allergies  Allergen Reactions  . Percocet [Oxycodone-Acetaminophen] Itching    Review of Systems: Review of Systems  Constitutional: Negative for chills and fever.  Respiratory: Negative for shortness of breath.   Cardiovascular: Negative for chest pain.  Gastrointestinal: Negative for abdominal pain, nausea and vomiting.    Physical Exam BP (!) 152/82   Pulse 72   Temp (!) 96.3 F (35.7 C) (Oral)   Ht _0  (1.651 m)   Wt (!) 319 lb (144.7 kg)   BMI 53.08 kg/m  CONSTITUTIONAL: No acute distresss HEENT:  Normocephalic, atraumatic, extraocular motion intact. RESPIRATORY:  Lungs are clear, and  breath sounds are equal bilaterally. Normal respiratory effort without pathologic use of accessory muscles. CARDIOVASCULAR: Heart is regular without murmurs, gallops, or rubs. GI: The abdomen is soft, nondistended, obese, nontender to palpation.  Negative Murphy's sign.  NEUROLOGIC:  Motor and sensation is grossly normal.  Cranial nerves are grossly intact. PSYCH:  Alert and oriented to person, place and time. Affect is normal.  Labs/Imaging: ERCP  10/28: -A filling defect consistent with a stone was seen on the cholangiogram. -Choledocholithiasis was found. Complete removal was accomplished by biliary sphincterotomy and balloon extraction. -A biliary sphincterotomy was performed. -The biliary tree was swept.   Assessment and Plan: This is a 57 y.o. male with an episode of gallstone pancreatitis, s/p ERCP.  Discussed with the patient that I would still recommend pursuing cholecystectomy so this does not happen again.  He is willing to proceed as well.  Discussed with him the risks of bleeding, infection, and injury to surrounding structures.  Discussed post-op restriction of no heavy lifting or pushing of no more than 10-15 lbs for a period of 4 weeks.  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  Tentatively we will schedule him for 11/11 in the afternoon.  He will need to stop his ASA 3 days prior to surgery.  Face-to-face time spent with the patient and care providers was 25 minutes, with more than 50% of the time spent counseling, educating, and coordinating care of the patient.     Melvyn Neth, [facility]

## 2017-11-26 NOTE — Progress Notes (Signed)
11/26/2017  History of Present Illness: Mike Patel is a 57 y.o. male who was recently admitted with acute gallstone pancreatitis, s/p ERCP with Dr. Allen Norris on 10/28.  Three stones were removed from his CBD and a sphincterotomy was done.  He was discharged the next day home, as unfortunately there was no OR time availability to do his cholecystectomy during the same admission.  He presents today for follow up and to discuss surgery.  He denies any abdominal pain, nausea, or vomiting.  Denies any jaundice like before his admission.  He has been maintaining a low fat diet.  Past Medical History: Past Medical History:  Diagnosis Date  . Arthritis   . GERD (gastroesophageal reflux disease)   . Hypertension   . Obesity   . Sleep apnea    cpap     Past Surgical History: Past Surgical History:  Procedure Laterality Date  . BACK SURGERY     x2  . COLONOSCOPY N/A 05/24/2014   Procedure: COLONOSCOPY;  Surgeon: Hulen Luster, MD;  Location: [facility] ENDOSCOPY;  Service: Gastroenterology;  Laterality: N/A;  . ELBOW SURGERY Right    tennis elbow  . ERCP N/A 11/18/2017   Procedure: ENDOSCOPIC RETROGRADE CHOLANGIOPANCREATOGRAPHY (ERCP);  Surgeon: Lucilla Lame, MD;  Location: [facility] ENDOSCOPY;  Service: Endoscopy;  Laterality: N/A;  . ESOPHAGOGASTRODUODENOSCOPY N/A 05/24/2014   Procedure: ESOPHAGOGASTRODUODENOSCOPY (EGD);  Surgeon: Hulen Luster, MD;  Location: [facility] ENDOSCOPY;  Service: Gastroenterology;  Laterality: N/A;  . PROSTATE SURGERY    . SPINE SURGERY     l-spine x 2    Home Medications: Prior to Admission medications   Medication Sig Start Date End Date Taking? Authorizing Provider  amLODipine (NORVASC) 10 MG tablet Take 10 mg by mouth daily.   Yes [provider]  aspirin 81 MG tablet Take 81 mg by mouth daily.   Yes [provider]  cyanocobalamin (,VITAMIN B-12,) 1000 MCG/ML injection Inject 1 mL into the muscle every 30 (thirty) days. 10/25/17  Yes [provider]   Cyanocobalamin 1000 MCG/ML KIT cyanocobalamin (vit B-12) 1,000 mcg/mL injection solution  INJECT 1 ML (1,000 MCG TOTAL) INTO THE MUSCLE MONTHLY 08/14/17  Yes [provider]  lansoprazole (PREVACID) 30 MG capsule Take 30 mg by mouth every morning.    Yes [provider]  lisinopril (PRINIVIL,ZESTRIL) 40 MG tablet Take 1 tablet (40 mg total) by mouth daily. 11/19/17  Yes Mayo, Pete Pelt, MD  metoprolol succinate (TOPROL-XL) 50 MG 24 hr tablet Take 50 mg by mouth daily. Take with or immediately following a meal.   Yes [provider]  tadalafil (CIALIS) 5 MG tablet Take 1 tablet by mouth as needed. 11/05/17  Yes [provider]  testosterone cypionate (DEPOTESTOSTERONE CYPIONATE) 200 MG/ML injection Inject 1 mL into the muscle every 14 (fourteen) days. 10/25/17  Yes [provider]    Allergies: Allergies  Allergen Reactions  . Percocet [Oxycodone-Acetaminophen] Itching    Review of Systems: Review of Systems  Constitutional: Negative for chills and fever.  Respiratory: Negative for shortness of breath.   Cardiovascular: Negative for chest pain.  Gastrointestinal: Negative for abdominal pain, nausea and vomiting.    Physical Exam BP (!) 152/82   Pulse 72   Temp (!) 96.3 F (35.7 C) (Oral)   Ht _0  (1.651 m)   Wt (!) 319 lb (144.7 kg)   BMI 53.08 kg/m  CONSTITUTIONAL: No acute distresss HEENT:  Normocephalic, atraumatic, extraocular motion intact. RESPIRATORY:  Lungs are clear, and  breath sounds are equal bilaterally. Normal respiratory effort without pathologic use of accessory muscles. CARDIOVASCULAR: Heart is regular without murmurs, gallops, or rubs. GI: The abdomen is soft, nondistended, obese, nontender to palpation.  Negative Murphy's sign.  NEUROLOGIC:  Motor and sensation is grossly normal.  Cranial nerves are grossly intact. PSYCH:  Alert and oriented to person, place and time. Affect is normal.  Labs/Imaging: ERCP  10/28: -A filling defect consistent with a stone was seen on the cholangiogram. -Choledocholithiasis was found. Complete removal was accomplished by biliary sphincterotomy and balloon extraction. -A biliary sphincterotomy was performed. -The biliary tree was swept.   Assessment and Plan: This is a 57 y.o. male with an episode of gallstone pancreatitis, s/p ERCP.  Discussed with the patient that I would still recommend pursuing cholecystectomy so this does not happen again.  He is willing to proceed as well.  Discussed with him the risks of bleeding, infection, and injury to surrounding structures.  Discussed post-op restriction of no heavy lifting or pushing of no more than 10-15 lbs for a period of 4 weeks.  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  Tentatively we will schedule him for 11/11 in the afternoon.  He will need to stop his ASA 3 days prior to surgery.  Face-to-face time spent with the patient and care providers was 25 minutes, with more than 50% of the time spent counseling, educating, and coordinating care of the patient.     Melvyn Neth, [facility]

## 2017-11-29 ENCOUNTER — Encounter
Admission: RE | Admit: 2017-11-29 | Discharge: 2017-11-29 | Disposition: A | Discharge status: Home / Self Care | Payer: BLUE CROSS/BLUE SHIELD | Source: Ambulatory Visit | Attending: Surgery | Admitting: Surgery

## 2017-11-29 ENCOUNTER — Other Ambulatory Visit: Payer: Self-pay

## 2017-11-29 HISTORY — DX: Prediabetes: R73.03

## 2017-11-29 NOTE — Patient Instructions (Signed)
Your procedure is scheduled on: 12-02-17 Report to Same Day Surgery 2nd floor medical mall [facility] Entrance-take elevator on left to 2nd floor.  Check in with surgery information desk.) To find out your arrival time please call [phone] between 1PM - 3PM on 12-01-17  Remember: Instructions that are not followed completely may result in serious medical risk, up to and including death, or upon the discretion of your surgeon and anesthesiologist your surgery may need to be rescheduled.    _x___ 1. Do not eat food after midnight the night before your procedure. You may drink clear liquids up to 2 hours before you are scheduled to arrive at the hospital for your procedure.  Do not drink clear liquids within 2 hours of your scheduled arrival to the hospital.  Clear liquids include  --Water or Apple juice without pulp  --Clear carbohydrate beverage such as ClearFast or Gatorade  --Black Coffee or Clear Tea (No milk, no creamers, do not add anything to the coffee or Tea   ____Ensure clear carbohydrate drink on the way to the hospital for bariatric patients  ____Ensure clear carbohydrate drink 3 hours before surgery for Dr Dwyane Luo patients if physician instructed.   No gum chewing or hard candies.     __x__ 2. No Alcohol for 24 hours before or after surgery.   __x__3. No Smoking or e-cigarettes for 24 prior to surgery.  Do not use any chewable tobacco products for at least 6 hour prior to surgery   ____  4. Bring all medications with you on the day of surgery if instructed.    __x__ 5. Notify your doctor if there is any change in your medical condition     (cold, fever, infections).    x___6. On the morning of surgery brush your teeth with toothpaste and water.  You may rinse your mouth with mouth wash if you wish.  Do not swallow any toothpaste or mouthwash.   Do not wear jewelry, make-up, hairpins, clips or nail polish.  Do not wear lotions, powders, or perfumes. You may wear  deodorant.  Do not shave 48 hours prior to surgery. Men may shave face and neck.  Do not bring valuables to the hospital.    [facility] is not responsible for any belongings or valuables.               Contacts, dentures or bridgework may not be worn into surgery.  Leave your suitcase in the car. After surgery it may be brought to your room.  For patients admitted to the hospital, discharge time is determined by your treatment team.  _  Patients discharged the day of surgery will not be allowed to drive home.  You will need someone to drive you home and stay with you the night of your procedure.    Please read over the following fact sheets that you were given:   [facility] Preparing for Surgery  _x___ TAKE THE FOLLOWING MEDICATION THE MORNING OF SURGERY WITH A SMALL SIP OF WATER. These include:  1. AMLODIPINE  2. METOPROLOL  3. PREVACID  4. TAKE AN EXTRA PREVACID THE NIGHT BEFORE SURGERY  5.  6.  ____Fleets enema or Magnesium Citrate as directed.   ____ Use CHG Soap or sage wipes as directed on instruction sheet   ____ Use inhalers on the day of surgery and bring to hospital day of surgery  ____ Stop Metformin and Janumet 2 days prior to surgery.    ____ Take  1/2 of usual insulin dose the night before surgery and none on the morning surgery.   _X___ Follow recommendations from Cardiologist, Pulmonologist or PCP regarding stopping Aspirin, Coumadin, Plavix ,Eliquis, Effient, or Pradaxa, and Pletal-PT HAS ALREADY STOPPED ASA  X____Stop Anti-inflammatories such as Advil, Aleve, Ibuprofen, Motrin, Naproxen, Naprosyn, Goodies powders or aspirin products NOW-OK to take Tylenol-PT HAS ALREADY STOPPED MELOXICAM   _x___ Stop supplements until after surgery-PT STOPPED HIS FISH OIL AND TURMERIC ALREADY   ____ Bring C-Pap to the hospital.

## 2017-12-01 MED ORDER — DEXTROSE 5 % IV SOLN
3.0000 g | INTRAVENOUS | Status: AC
  Administered 2017-12-02: 3 g via INTRAVENOUS
  Filled 2017-12-01: qty 3000

## 2017-12-02 ENCOUNTER — Other Ambulatory Visit: Payer: Self-pay

## 2017-12-02 ENCOUNTER — Ambulatory Visit
Admission: RE | Admit: 2017-12-02 | Discharge: 2017-12-02 | Disposition: A | Discharge status: Home / Self Care | Payer: BLUE CROSS/BLUE SHIELD | Source: Ambulatory Visit | Attending: Surgery | Admitting: Surgery

## 2017-12-02 ENCOUNTER — Ambulatory Visit: Payer: BLUE CROSS/BLUE SHIELD | Admitting: Anesthesiology

## 2017-12-02 ENCOUNTER — Ambulatory Visit: Payer: BLUE CROSS/BLUE SHIELD

## 2017-12-02 ENCOUNTER — Encounter
Admission: RE | Disposition: A | Discharge status: Home / Self Care | Payer: Self-pay | Source: Ambulatory Visit | Attending: Surgery

## 2017-12-02 DIAGNOSIS — G4730 Sleep apnea, unspecified: Secondary | ICD-10-CM | POA: Diagnosis not present

## 2017-12-02 DIAGNOSIS — Z6841 Body Mass Index (BMI) 40.0 and over, adult: Secondary | ICD-10-CM | POA: Insufficient documentation

## 2017-12-02 DIAGNOSIS — K8080 Other cholelithiasis without obstruction: Secondary | ICD-10-CM | POA: Diagnosis present

## 2017-12-02 DIAGNOSIS — K219 Gastro-esophageal reflux disease without esophagitis: Secondary | ICD-10-CM | POA: Insufficient documentation

## 2017-12-02 DIAGNOSIS — Z79899 Other long term (current) drug therapy: Secondary | ICD-10-CM | POA: Diagnosis not present

## 2017-12-02 DIAGNOSIS — Z79890 Hormone replacement therapy (postmenopausal): Secondary | ICD-10-CM | POA: Insufficient documentation

## 2017-12-02 DIAGNOSIS — I10 Essential (primary) hypertension: Secondary | ICD-10-CM | POA: Insufficient documentation

## 2017-12-02 DIAGNOSIS — Z7982 Long term (current) use of aspirin: Secondary | ICD-10-CM | POA: Diagnosis not present

## 2017-12-02 DIAGNOSIS — K8010 Calculus of gallbladder with chronic cholecystitis without obstruction: Secondary | ICD-10-CM | POA: Diagnosis not present

## 2017-12-02 DIAGNOSIS — K851 Biliary acute pancreatitis without necrosis or infection: Secondary | ICD-10-CM | POA: Diagnosis not present

## 2017-12-02 DIAGNOSIS — E669 Obesity, unspecified: Secondary | ICD-10-CM | POA: Insufficient documentation

## 2017-12-02 DIAGNOSIS — Z419 Encounter for procedure for purposes other than remedying health state, unspecified: Secondary | ICD-10-CM

## 2017-12-02 HISTORY — PX: CHOLECYSTECTOMY: SHX55

## 2017-12-02 LAB — GLUCOSE, CAPILLARY
Glucose-Capillary: 121 mg/dL — ABNORMAL HIGH (ref 70–99)
Glucose-Capillary: 188 mg/dL — ABNORMAL HIGH (ref 70–99)

## 2017-12-02 SURGERY — LAPAROSCOPIC CHOLECYSTECTOMY WITH INTRAOPERATIVE CHOLANGIOGRAM
Anesthesia: General | Site: Abdomen

## 2017-12-02 MED ORDER — PROPOFOL 10 MG/ML IV BOLUS
INTRAVENOUS | Status: AC
  Filled 2017-12-02: qty 20

## 2017-12-02 MED ORDER — IBUPROFEN 600 MG PO TABS: 600.0000 mg | ORAL_TABLET | Freq: Three times a day (TID) | ORAL | 0 refills | Status: DC | PRN

## 2017-12-02 MED ORDER — HYDROCODONE-ACETAMINOPHEN 5-325 MG PO TABS
1.0000 | ORAL_TABLET | ORAL | Status: DC | PRN
  Administered 2017-12-02: 1 via ORAL

## 2017-12-02 MED ORDER — SUGAMMADEX SODIUM 200 MG/2ML IV SOLN
INTRAVENOUS | Status: DC | PRN
  Administered 2017-12-02: 200 mg via INTRAVENOUS

## 2017-12-02 MED ORDER — DEXAMETHASONE SODIUM PHOSPHATE 10 MG/ML IJ SOLN
INTRAMUSCULAR | Status: DC | PRN
  Administered 2017-12-02: 10 mg via INTRAVENOUS

## 2017-12-02 MED ORDER — HYDROCODONE-ACETAMINOPHEN 5-325 MG PO TABS
ORAL_TABLET | ORAL | Status: AC
  Filled 2017-12-02: qty 1

## 2017-12-02 MED ORDER — LACTATED RINGERS IV SOLN: INTRAVENOUS | Status: DC

## 2017-12-02 MED ORDER — SODIUM CHLORIDE (PF) 0.9 % IJ SOLN
INTRAMUSCULAR | Status: AC
  Filled 2017-12-02: qty 10

## 2017-12-02 MED ORDER — FENTANYL CITRATE (PF) 100 MCG/2ML IJ SOLN
INTRAMUSCULAR | Status: AC
  Filled 2017-12-02: qty 4

## 2017-12-02 MED ORDER — FENTANYL CITRATE (PF) 100 MCG/2ML IJ SOLN
INTRAMUSCULAR | Status: AC
  Administered 2017-12-02: 25 ug via INTRAVENOUS
  Filled 2017-12-02: qty 2

## 2017-12-02 MED ORDER — LACTATED RINGERS IV SOLN
INTRAVENOUS | Status: DC | PRN
  Administered 2017-12-02 (×2): via INTRAVENOUS

## 2017-12-02 MED ORDER — GABAPENTIN 300 MG PO CAPS
ORAL_CAPSULE | ORAL | Status: AC
  Administered 2017-12-02: 300 mg via ORAL
  Filled 2017-12-02: qty 1

## 2017-12-02 MED ORDER — MIDAZOLAM HCL 2 MG/2ML IJ SOLN
INTRAMUSCULAR | Status: AC
  Filled 2017-12-02: qty 2

## 2017-12-02 MED ORDER — PROMETHAZINE HCL 25 MG/ML IJ SOLN
INTRAMUSCULAR | Status: AC
  Filled 2017-12-02: qty 1

## 2017-12-02 MED ORDER — PROMETHAZINE HCL 25 MG/ML IJ SOLN
6.2500 mg | INTRAMUSCULAR | Status: DC | PRN
  Administered 2017-12-02: 6.25 mg via INTRAVENOUS

## 2017-12-02 MED ORDER — SODIUM CHLORIDE 0.9 % IV SOLN
INTRAVENOUS | Status: DC
  Administered 2017-12-02: 09:00:00 via INTRAVENOUS

## 2017-12-02 MED ORDER — FENTANYL CITRATE (PF) 100 MCG/2ML IJ SOLN
25.0000 ug | INTRAMUSCULAR | Status: AC | PRN
  Administered 2017-12-02 (×6): 25 ug via INTRAVENOUS

## 2017-12-02 MED ORDER — MEPERIDINE HCL 50 MG/ML IJ SOLN: 6.2500 mg | INTRAMUSCULAR | Status: DC | PRN

## 2017-12-02 MED ORDER — ONDANSETRON HCL 4 MG/2ML IJ SOLN
INTRAMUSCULAR | Status: DC | PRN
  Administered 2017-12-02: 4 mg via INTRAVENOUS

## 2017-12-02 MED ORDER — ROCURONIUM BROMIDE 100 MG/10ML IV SOLN
INTRAVENOUS | Status: DC | PRN
  Administered 2017-12-02: 50 mg via INTRAVENOUS
  Administered 2017-12-02: 20 mg via INTRAVENOUS

## 2017-12-02 MED ORDER — FENTANYL CITRATE (PF) 100 MCG/2ML IJ SOLN
INTRAMUSCULAR | Status: DC | PRN
  Administered 2017-12-02: 50 ug via INTRAVENOUS
  Administered 2017-12-02: 100 ug via INTRAVENOUS

## 2017-12-02 MED ORDER — SODIUM CHLORIDE 0.9 % IV SOLN
INTRAVENOUS | Status: DC | PRN
  Administered 2017-12-02: 25 ug/min via INTRAVENOUS

## 2017-12-02 MED ORDER — BUPIVACAINE-EPINEPHRINE (PF) 0.25% -1:200000 IJ SOLN
INTRAMUSCULAR | Status: AC
  Filled 2017-12-02: qty 30

## 2017-12-02 MED ORDER — CHLORHEXIDINE GLUCONATE CLOTH 2 % EX PADS
6.0000 | MEDICATED_PAD | Freq: Once | CUTANEOUS | Status: AC
  Administered 2017-12-02: 6 via TOPICAL

## 2017-12-02 MED ORDER — GABAPENTIN 300 MG PO CAPS
300.0000 mg | ORAL_CAPSULE | ORAL | Status: AC
  Administered 2017-12-02: 300 mg via ORAL

## 2017-12-02 MED ORDER — HYDROCODONE-ACETAMINOPHEN 5-325 MG PO TABS: 1.0000 | ORAL_TABLET | ORAL | 0 refills | Status: DC | PRN

## 2017-12-02 MED ORDER — PROPOFOL 10 MG/ML IV BOLUS
INTRAVENOUS | Status: DC | PRN
  Administered 2017-12-02: 200 mg via INTRAVENOUS

## 2017-12-02 MED ORDER — LIDOCAINE HCL (CARDIAC) PF 100 MG/5ML IV SOSY
PREFILLED_SYRINGE | INTRAVENOUS | Status: DC | PRN
  Administered 2017-12-02: 100 mg via INTRAVENOUS

## 2017-12-02 MED ORDER — BUPIVACAINE-EPINEPHRINE (PF) 0.25% -1:200000 IJ SOLN
INTRAMUSCULAR | Status: DC | PRN
  Administered 2017-12-02: 30 mL

## 2017-12-02 MED ORDER — PHENYLEPHRINE HCL 10 MG/ML IJ SOLN
INTRAMUSCULAR | Status: DC | PRN
  Administered 2017-12-02: 200 ug via INTRAVENOUS
  Administered 2017-12-02 (×2): 100 ug via INTRAVENOUS
  Administered 2017-12-02 (×3): 200 ug via INTRAVENOUS

## 2017-12-02 MED ORDER — CHLORHEXIDINE GLUCONATE CLOTH 2 % EX PADS: 6.0000 | MEDICATED_PAD | Freq: Once | CUTANEOUS | Status: DC

## 2017-12-02 MED ORDER — SODIUM CHLORIDE 0.9 % IV SOLN
INTRAVENOUS | Status: DC | PRN
  Administered 2017-12-02: 8 mL

## 2017-12-02 SURGICAL SUPPLY — 43 items
APPLIER CLIP 5 13 M/L LIGAMAX5 (MISCELLANEOUS) ×3
BLADE SURG 15 STRL LF DISP TIS (BLADE) ×1 IMPLANT
BLADE SURG 15 STRL SS (BLADE) ×2
CANISTER SUCT 1200ML W/VALVE (MISCELLANEOUS) ×3 IMPLANT
CATH CHOLANGI 4FR 420404F (CATHETERS) IMPLANT
CHLORAPREP W/TINT 26ML (MISCELLANEOUS) ×3 IMPLANT
CLIP APPLIE 5 13 M/L LIGAMAX5 (MISCELLANEOUS) ×1 IMPLANT
CONRAY 60ML FOR OR (MISCELLANEOUS) ×3 IMPLANT
COVER WAND RF STERILE (DRAPES) ×3 IMPLANT
DERMABOND ADVANCED (GAUZE/BANDAGES/DRESSINGS) ×2
DERMABOND ADVANCED .7 DNX12 (GAUZE/BANDAGES/DRESSINGS) ×1 IMPLANT
DRAPE C-ARM XRAY 36X54 (DRAPES) IMPLANT
ELECT REM PT RETURN 9FT ADLT (ELECTROSURGICAL) ×3
ELECTRODE REM PT RTRN 9FT ADLT (ELECTROSURGICAL) ×1 IMPLANT
FILTER LAP SMOKE EVAC STRL (MISCELLANEOUS) ×3 IMPLANT
GLOVE SURG SYN 7.0 (GLOVE) ×3 IMPLANT
GLOVE SURG SYN 7.5  E (GLOVE) ×2
GLOVE SURG SYN 7.5 E (GLOVE) ×1 IMPLANT
GOWN STRL REUS W/ TWL LRG LVL3 (GOWN DISPOSABLE) ×3 IMPLANT
GOWN STRL REUS W/TWL LRG LVL3 (GOWN DISPOSABLE) ×6
IRRIGATION STRYKERFLOW (MISCELLANEOUS) ×1 IMPLANT
IRRIGATOR STRYKERFLOW (MISCELLANEOUS) ×3
IV CATH ANGIO 12GX3 LT BLUE (NEEDLE) ×3 IMPLANT
IV NS 1000ML (IV SOLUTION)
IV NS 1000ML BAXH (IV SOLUTION) IMPLANT
JACKSON PRATT 10 (INSTRUMENTS) IMPLANT
L-HOOK LAP DISP 36CM (ELECTROSURGICAL) ×3
LABEL OR SOLS (LABEL) ×3 IMPLANT
LHOOK LAP DISP 36CM (ELECTROSURGICAL) ×1 IMPLANT
NEEDLE HYPO 22GX1.5 SAFETY (NEEDLE) ×6 IMPLANT
PACK LAP CHOLECYSTECTOMY (MISCELLANEOUS) ×3 IMPLANT
PENCIL ELECTRO HAND CTR (MISCELLANEOUS) ×3 IMPLANT
POUCH SPECIMEN RETRIEVAL 10MM (ENDOMECHANICALS) ×3 IMPLANT
SCISSORS METZENBAUM CVD 33 (INSTRUMENTS) ×3 IMPLANT
SLEEVE ADV FIXATION 5X100MM (TROCAR) ×9 IMPLANT
SPONGE VERSALON 4X4 4PLY (MISCELLANEOUS) IMPLANT
SUT MNCRL 4-0 (SUTURE) ×2
SUT MNCRL 4-0 27XMFL (SUTURE) ×1
SUT VICRYL 0 AB UR-6 (SUTURE) ×3 IMPLANT
SUTURE MNCRL 4-0 27XMF (SUTURE) ×1 IMPLANT
TROCAR BALLN GELPORT 12X130M (ENDOMECHANICALS) ×3 IMPLANT
TROCAR Z-THREAD OPTICAL 5X100M (TROCAR) ×3 IMPLANT
TUBING INSUFFLATION (TUBING) ×3 IMPLANT

## 2017-12-02 NOTE — Anesthesia Preprocedure Evaluation (Addendum)
Anesthesia Evaluation  Patient identified by MRN, date of birth, ID band Patient awake    Reviewed: Allergy & Precautions, NPO status , Patient's Chart, lab work & pertinent test results  History of Anesthesia Complications Negative for: history of anesthetic complications  Airway Mallampati: II  TM Distance: >3 FB Neck ROM: Full    Dental no notable dental hx.    Pulmonary sleep apnea and Continuous Positive Airway Pressure Ventilation , neg COPD,    breath sounds clear to auscultation- rhonchi (-) wheezing      Cardiovascular hypertension, Pt. on medications (-) CAD, (-) Past MI, (-) Cardiac Stents and (-) CABG  Rhythm:Regular Rate:Normal - Systolic murmurs and - Diastolic murmurs    Neuro/Psych negative neurological ROS  negative psych ROS   GI/Hepatic Neg liver ROS, GERD  ,  Endo/Other  negative endocrine ROSneg diabetes  Renal/GU negative Renal ROS     Musculoskeletal  (+) Arthritis ,   Abdominal (+) + obese,   Peds  Hematology negative hematology ROS (+)   Anesthesia Other Findings Past Medical History: No date: Arthritis     Comment:  BACK No date: GERD (gastroesophageal reflux disease) No date: Hypertension No date: Obesity No date: Pre-diabetes No date: Sleep apnea     Comment:  cpap   Reproductive/Obstetrics                             Anesthesia Physical Anesthesia Plan  ASA: II  Anesthesia Plan: General   Post-op Pain Management:    Induction: Intravenous  PONV Risk Score and Plan: 1 and Ondansetron and Midazolam  Airway Management Planned: Oral ETT  Additional Equipment:   Intra-op Plan:   Post-operative Plan: Extubation in OR  Informed Consent: I have reviewed the patients History and Physical, chart, labs and discussed the procedure including the risks, benefits and alternatives for the proposed anesthesia with the patient or authorized representative  who has indicated his/her understanding and acceptance.   Dental advisory given  Plan Discussed with: CRNA and Anesthesiologist  Anesthesia Plan Comments:        Anesthesia Quick Evaluation

## 2017-12-02 NOTE — Interval H&P Note (Signed)
History and Physical Interval Note:  12/02/2017 9:11 AM  Mike Patel  has presented today for surgery, with the diagnosis of gallstone pancreatitis  The various methods of treatment have been discussed with the patient and family. After consideration of risks, benefits and other options for treatment, the patient has consented to  Procedure(s): LAPAROSCOPIC CHOLECYSTECTOMY WITH INTRAOPERATIVE CHOLANGIOGRAM (N/A) as a surgical intervention .  The patient's history has been reviewed, patient examined, no change in status, stable for surgery.  I have reviewed the patient's chart and labs.  Questions were answered to the patient's satisfaction.     [name]

## 2017-12-02 NOTE — Op Note (Signed)
Procedure Date:  12/02/2017  Pre-operative Diagnosis:   Gallstone pancreatitis  Post-operative Diagnosis:  Gallstone pancreatitis  Procedure:  Laparoscopic cholecystectomy with intraoperative cholangiogram  Surgeon:  Melvyn Neth, MD  Anesthesia:  General endotracheal  Estimated Blood Loss:  10 ml  Specimens:  gallbladder  Complications:  None  Indications for Procedure:  This is a 57 y.o. male who presents with abdominal pain and workup revealing gallstone pancreatitis.  He had ERCP on 10/28 but due to OR time availability, he was discharged home for outpatient cholecystectomy.  The benefits, complications, treatment options, and expected outcomes were discussed with the patient. The risks of bleeding, infection, recurrence of symptoms, failure to resolve symptoms, bile duct damage, bile duct leak, retained common bile duct stone, bowel injury, and need for further procedures were all discussed with the patient and he was willing to proceed.  Description of Procedure: The patient was correctly identified in the preoperative area and brought into the operating room.  The patient was placed supine with VTE prophylaxis in place.  Appropriate time-outs were performed.  Anesthesia was induced and the patient was intubated.  Appropriate antibiotics were infused.  The abdomen was prepped and draped in a sterile fashion. An infraumbilical incision was made. A cutdown technique was used to enter the abdominal cavity without injury, and a Hasson trocar was inserted.  Pneumoperitoneum was obtained with appropriate opening pressures.  A 5-mm port was placed in the subxiphoid area and two 5-mm ports were placed in the right upper quadrant under direct visualization.  The gallbladder was identified.  The fundus was grasped and retracted cephalad.  Adhesions were lysed bluntly and with electrocautery. The infundibulum was grasped and retracted laterally, exposing the peritoneum overlying the  gallbladder.  This was incised with electrocautery and extended on either side of the gallbladder.  The cystic duct and cystic artery were clearly identified and bluntly dissected.  The cystic duct was clipped once distally and a ductotomy was created.  A 14Fr angiocath was placed in the right upper quadrant and the cholangiogram catheter passed through it.  Using [place] forceps, the catheter was introduced into the ductotomy and secured with a clip.  The C-arm was then brought into the field and a cholangiogram was performed showing contrast flowing into CBD and into the duodenum, tapering appropriately.  I was not able to see any filling defects, though per radiology read, there are two non-occlusive filling defects, which could potentially be air bubbles.   After completion of the cholangiogram, the catheter was removed and the cystic duct was clipped twice proximally and cut in between.  The cystic artery was clipped and cut in same fashion.  The gallbladder was taken from the gallbladder fossa in a retrograde fashion with electrocautery. The gallbladder was placed in an Endocatch bag. The liver bed was inspected and any bleeding was controlled with electrocautery. The right upper quadrant was then inspected again revealing intact clips, no bleeding, and no ductal injury.  The area was thoroughly irrigated.  The 5 mm ports were removed under direct visualization and the Hasson trocar was removed.  The Endocatch bag and brought out via the umbilical incision.  The fascial opening was closed using 0 vicryl suture.  Local anesthetic was infused in all incisions and the incisions were closed with 4-0 Monocryl.  The wounds were cleaned and sealed with DermaBond.  The patient was emerged from anesthesia and extubated and brought to the recovery room for further management.  The patient tolerated the  procedure well and all counts were correct at the end of the case.   Melvyn Neth, MD

## 2017-12-02 NOTE — Anesthesia Post-op Follow-up Note (Signed)
Anesthesia QCDR form completed.        

## 2017-12-02 NOTE — Discharge Instructions (Signed)
AMBULATORY SURGERY  °DISCHARGE INSTRUCTIONS ° ° °1) The drugs that you were given will stay in your system until tomorrow so for the next 24 hours you should not: ° °A) Drive an automobile °B) Make any legal decisions °C) Drink any alcoholic beverage ° ° °2) You may resume regular meals tomorrow.  Today it is better to start with liquids and gradually work up to solid foods. ° °You may eat anything you prefer, but it is better to start with liquids, then soup and crackers, and gradually work up to solid foods. ° ° °3) Please notify your doctor immediately if you have any unusual bleeding, trouble breathing, redness and pain at the surgery site, drainage, fever, or pain not relieved by medication. ° ° ° °4) Additional Instructions: ° ° ° ° ° ° ° °Please contact your physician with any problems or Same Day Surgery at [phone], Monday through Friday 6 am to 4 pm, or [place] [facility] [place] Main number at [phone]. °

## 2017-12-02 NOTE — Transfer of Care (Signed)
Immediate Anesthesia Transfer of Care Note  Patient: Mike Patel  Procedure(s) Performed: LAPAROSCOPIC CHOLECYSTECTOMY WITH INTRAOPERATIVE CHOLANGIOGRAM (N/A Abdomen)  Patient Location: PACU  Anesthesia Type:General  Level of Consciousness: awake, alert  and oriented  Airway & Oxygen Therapy: Patient Spontanous Breathing and Patient connected to face mask oxygen  Post-op Assessment: Report given to RN and Post -op Vital signs reviewed and stable  Post vital signs: Reviewed and stable  Last Vitals:  Vitals Value Taken Time  BP    Temp    Pulse 77 12/02/2017  2:31 PM  Resp 12 12/02/2017  2:31 PM  SpO2 97 % 12/02/2017  2:31 PM  Vitals shown include unvalidated device data.  Last Pain:  Vitals:   12/02/17 0841  TempSrc: Temporal  PainSc: 0-No pain         Complications: No apparent anesthesia complications

## 2017-12-02 NOTE — Anesthesia Procedure Notes (Signed)
Procedure Name: Intubation Date/Time: 12/02/2017 12:18 PM Performed by: Philbert Riser, CRNA Pre-anesthesia Checklist: Patient identified, Emergency Drugs available, Suction available, Patient being monitored and Timeout performed Patient Re-evaluated:Patient Re-evaluated prior to induction Oxygen Delivery Method: Circle system utilized and Simple face mask Preoxygenation: Pre-oxygenation with 100% oxygen Induction Type: IV induction Ventilation: Two handed mask ventilation required Laryngoscope Size: McGraph and 3 Grade View: Grade I Tube type: Oral Tube size: 7.5 mm Number of attempts: 1 Airway Equipment and Method: Stylet Placement Confirmation: ETT inserted through vocal cords under direct vision,  positive ETCO2 and breath sounds checked- equal and bilateral Secured at: 22 cm Tube secured with: Tape Dental Injury: Teeth and Oropharynx as per pre-operative assessment

## 2017-12-03 ENCOUNTER — Encounter: Payer: Self-pay | Admitting: Surgery

## 2017-12-03 NOTE — Anesthesia Postprocedure Evaluation (Signed)
Anesthesia Post Note  Patient: Mike Patel  Procedure(s) Performed: LAPAROSCOPIC CHOLECYSTECTOMY WITH INTRAOPERATIVE CHOLANGIOGRAM (N/A Abdomen)  Patient location during evaluation: PACU Anesthesia Type: General Level of consciousness: awake and alert Pain management: pain level controlled Vital Signs Assessment: post-procedure vital signs reviewed and stable Respiratory status: spontaneous breathing, nonlabored ventilation, respiratory function stable and patient connected to nasal cannula oxygen Cardiovascular status: blood pressure returned to baseline and stable Postop Assessment: no apparent nausea or vomiting Anesthetic complications: no     Last Vitals:  Vitals:   12/02/17 1533 12/02/17 1600  BP: 118/67 (!) 153/71  Pulse: 69 77  Resp: 16 16  Temp:  36.4 C  SpO2: 95% 96%    Last Pain:  Vitals:   12/03/17 0823  TempSrc:   PainSc: 0-No pain                 Martha Clan

## 2017-12-04 LAB — SURGICAL PATHOLOGY

## 2017-12-13 ENCOUNTER — Encounter: Payer: Self-pay | Admitting: Surgery

## 2017-12-13 ENCOUNTER — Ambulatory Visit (INDEPENDENT_AMBULATORY_CARE_PROVIDER_SITE_OTHER): Payer: BLUE CROSS/BLUE SHIELD | Admitting: Surgery

## 2017-12-13 ENCOUNTER — Other Ambulatory Visit: Payer: Self-pay

## 2017-12-13 VITALS — BP 149/86 | HR 64 | Temp 97.7°F | Resp 16 | Ht 72.0 in | Wt 318.6 lb

## 2017-12-13 DIAGNOSIS — Z09 Encounter for follow-up examination after completed treatment for conditions other than malignant neoplasm: Secondary | ICD-10-CM

## 2017-12-13 DIAGNOSIS — K851 Biliary acute pancreatitis without necrosis or infection: Secondary | ICD-10-CM

## 2017-12-13 NOTE — Patient Instructions (Signed)
Patient is to return to the office as needed. No heavy lifting until 01/13/18.   Call the office with any questions or concerns.

## 2017-12-13 NOTE — Progress Notes (Signed)
12/13/2017  HPI: CORBETT MOULDER is a 57 y.o. male s/p laparoscopic cholecystectomy with cholangiogram on 12/02/17.  He presents for follow up.  He reports doing well.  Reports some occasional bloatedness.  He also developed a rash at the incisions, likely from dermabond.  They were itching but that has improved.  Vital signs: BP (!) 149/86   Pulse 64   Temp 97.7 F (36.5 C) (Temporal)   Resp 16   Ht 6' (1.829 m)   Wt (!) 318 lb 9.6 oz (144.5 kg)   SpO2 98%   BMI 43.21 kg/m    Physical Exam: Constitutional: No acute distress Abdomen:  Soft, nondistended, nontender to palpation.  Incisions clean, dry, with small surrounding rash, likely allergic in nature.  No evidence of infection.  Assessment/Plan: This is a 57 y.o. male s/p laparoscopic cholecystectomy with cholangiogram.  --reviewed pathology with patient.  Chronic cholecystitis with cholelithiasis, negative for malignancy. --recommended hydrocortisone or benadryl ointments for the rash. --patient has two more weeks of no heavy lifting or pushing.  Will give work note so he can return to work at that point without restrictions. --may follow up prn.   Melvyn Neth, [facility]

## 2018-07-18 ENCOUNTER — Ambulatory Visit: Payer: BLUE CROSS/BLUE SHIELD | Admitting: Surgery

## 2019-04-22 ENCOUNTER — Other Ambulatory Visit: Payer: Self-pay

## 2019-05-10 NOTE — Progress Notes (Signed)
05/11/19 3:34 PM   Mike Patel [birth year] [id number]  Referring provider: Maryland Pink, MD [address] [place],  [place] [address]  Chief Complaint  Patient presents with  . Other    HPI: Mike Patel is a 59 y.o. M who presents today to establish care. Previous Dr. Jacqlyn Larsen pt last seen march 2019.   -hx of BPH and hypogonadism  -PSA 1.15, 03/2017 -he is currently on testosterone injections 0.5 weekly managed by Dr.  Kary Kos -urgency however not bothersome  -TURP 2015 -Not on any BPH meds  -hematospermia onset 3 weeks ago, resolved  -History right spermatocele which has not changed in years -currently on tadalafil 5 mg -Blood work 04/20/19, Creatinine 0.90, Hematocrit 54.1  PMH: Past Medical History:  Diagnosis Date  . Arthritis    BACK  . GERD (gastroesophageal reflux disease)   . Hypertension   . Obesity   . Pre-diabetes   . Sleep apnea    cpap    Surgical History: Past Surgical History:  Procedure Laterality Date  . BACK SURGERY     x3  . CHOLECYSTECTOMY N/A 12/02/2017   Procedure: LAPAROSCOPIC CHOLECYSTECTOMY WITH INTRAOPERATIVE CHOLANGIOGRAM;  Surgeon: Olean Ree, MD;  Location: [facility] ORS;  Service: General;  Laterality: N/A;  . COLONOSCOPY N/A 05/24/2014   Procedure: COLONOSCOPY;  Surgeon: Hulen Luster, MD;  Location: [facility] ENDOSCOPY;  Service: Gastroenterology;  Laterality: N/A;  . ELBOW SURGERY Right    tennis elbow  . ERCP N/A 11/18/2017   Procedure: ENDOSCOPIC RETROGRADE CHOLANGIOPANCREATOGRAPHY (ERCP);  Surgeon: Lucilla Lame, MD;  Location: [facility] ENDOSCOPY;  Service: Endoscopy;  Laterality: N/A;  . ESOPHAGOGASTRODUODENOSCOPY N/A 05/24/2014   Procedure: ESOPHAGOGASTRODUODENOSCOPY (EGD);  Surgeon: Hulen Luster, MD;  Location: [facility] ENDOSCOPY;  Service: Gastroenterology;  Laterality: N/A;  . PROSTATE SURGERY    . SPINE SURGERY     l-spine x 2    Home Medications:  Allergies as of 05/11/2019      Reactions   Percocet  [oxycodone-acetaminophen] Itching   Tape Rash   Dermabond skin glue and Tape      Medication List       Accurate as of May 11, 2019  3:34 PM. If you have any questions, ask your nurse or doctor.        amLODipine 10 MG tablet Commonly known as: NORVASC Take 10 mg by mouth every morning.   aspirin EC 81 MG tablet Take 81 mg by mouth daily.   atorvastatin 20 MG tablet Commonly known as: LIPITOR Take by mouth.   Cyanocobalamin 1000 MCG/ML Kit Inject 1,000 mcg into the muscle every 30 (thirty) days. What changed: Another medication with the same name was removed. Continue taking this medication, and follow the directions you see here. Changed by: Abbie Sons, MD   Fish Oil 1000 MG Caps Take 2,000 mg by mouth 2 (two) times daily.   fluticasone 50 MCG/ACT nasal spray Commonly known as: FLONASE Place 2 sprays into both nostrils daily.   ibuprofen 600 MG tablet Commonly known as: ADVIL Take 1 tablet (600 mg total) by mouth every 8 (eight) hours as needed.   lansoprazole 30 MG capsule Commonly known as: PREVACID Take 30 mg by mouth daily before breakfast.   lisinopril 40 MG tablet Commonly known as: ZESTRIL Take 1 tablet (40 mg total) by mouth daily. What changed: when to take this   meloxicam 15 MG tablet Commonly known as: MOBIC Take 15 mg by mouth daily.  metFORMIN 500 MG 24 hr tablet Commonly known as: GLUCOPHAGE-XR SMARTSIG:2 Tablet(s) By Mouth Every Evening   methocarbamol 500 MG tablet Commonly known as: ROBAXIN Take 500 mg by mouth 2 (two) times daily.   metoprolol succinate 50 MG 24 hr tablet Commonly known as: TOPROL-XL Take 50 mg by mouth every morning. Take with or immediately following a meal.   tadalafil 5 MG tablet Commonly known as: CIALIS Take 5 mg by mouth daily as needed for erectile dysfunction.   testosterone cypionate 200 MG/ML injection Commonly known as: DEPOTESTOSTERONE CYPIONATE Inject 200 mg into the muscle every 14  (fourteen) days.   Turmeric 500 MG Caps Take 500 mg by mouth 2 (two) times daily.   Vitamin D-3 125 MCG (5000 UT) Tabs Take 5,000 Units by mouth 2 (two) times daily.       Allergies:  Allergies  Allergen Reactions  . Percocet [Oxycodone-Acetaminophen] Itching  . Tape Rash    Dermabond skin glue and Tape    Family History: Family History  Problem Relation Age of Onset  . Hypertension Mother     Social History:  reports that he has never smoked. He has never used smokeless tobacco. He reports that he does not drink alcohol or use drugs.   Physical Exam: BP (!) 166/101   Pulse 88   Ht '5\' 10"'  (1.778 m)   Wt (!) 326 lb (147.9 kg)   BMI 46.78 kg/m   Constitutional:  Alert and oriented, No acute distress. HEENT: [place] AT, moist mucus membranes.  Trachea midline, no masses. Cardiovascular: No clubbing, cyanosis, or edema. Respiratory: Normal respiratory effort, no increased work of breathing. GU: Phallus without lesions, testes descended bilaterally.  Right spermatocele Rectal: Normal sphincter tone, 60 g prostate, no nodules or tenderness.  Skin: No rashes, bruises or suspicious lesions. Neurologic: Grossly intact, no focal deficits, moving all 4 extremities. Psychiatric: Normal mood and affect.  Assessment & Plan:    1. BPH w/o LUTS  PSA stable  No bothersome LUTS Return in 1 year for symptom recheck/blood work   2. Hypogonadism  Managed by Dr. Chrystine Oiler  3. ED Continue tadalafil 5 mg prn  4. Hematospermia  Benign DRE and PSA 04/20/2019 was stable at 1.5     [facility] [address] [place], [place] [address] [phone]  I, Lucas Mallow, am acting as a scribe for Dr. Nicki Reaper C. Jame Seelig,  I have reviewed the above documentation for accuracy and completeness, and I agree with the above.    Abbie Sons, MD

## 2019-05-11 ENCOUNTER — Ambulatory Visit: Payer: BC Managed Care – PPO | Admitting: Urology

## 2019-05-11 ENCOUNTER — Other Ambulatory Visit: Payer: Self-pay

## 2019-05-11 ENCOUNTER — Encounter: Payer: Self-pay | Admitting: Urology

## 2019-05-11 VITALS — BP 166/101 | HR 88 | Ht 70.0 in | Wt 326.0 lb

## 2019-05-11 DIAGNOSIS — R361 Hematospermia: Secondary | ICD-10-CM

## 2019-05-11 DIAGNOSIS — N401 Enlarged prostate with lower urinary tract symptoms: Secondary | ICD-10-CM

## 2019-05-11 DIAGNOSIS — N5201 Erectile dysfunction due to arterial insufficiency: Secondary | ICD-10-CM

## 2019-05-11 DIAGNOSIS — E291 Testicular hypofunction: Secondary | ICD-10-CM | POA: Diagnosis not present

## 2019-05-11 MED ORDER — TADALAFIL 20 MG PO TABS: ORAL_TABLET | ORAL | 2 refills | Status: DC

## 2019-05-12 ENCOUNTER — Encounter: Payer: Self-pay | Admitting: Urology

## 2019-05-19 ENCOUNTER — Other Ambulatory Visit: Payer: Self-pay

## 2019-05-19 ENCOUNTER — Ambulatory Visit (INDEPENDENT_AMBULATORY_CARE_PROVIDER_SITE_OTHER): Payer: BC Managed Care – PPO | Admitting: Dermatology

## 2019-05-19 DIAGNOSIS — D1030 Benign neoplasm of unspecified part of mouth: Secondary | ICD-10-CM

## 2019-05-19 DIAGNOSIS — D170 Benign lipomatous neoplasm of skin and subcutaneous tissue of head, face and neck: Secondary | ICD-10-CM

## 2019-05-19 DIAGNOSIS — D485 Neoplasm of uncertain behavior of skin: Secondary | ICD-10-CM

## 2019-05-19 DIAGNOSIS — C4359 Malignant melanoma of other part of trunk: Secondary | ICD-10-CM

## 2019-05-19 DIAGNOSIS — C4401 Basal cell carcinoma of skin of lip: Secondary | ICD-10-CM

## 2019-05-19 DIAGNOSIS — L570 Actinic keratosis: Secondary | ICD-10-CM | POA: Diagnosis not present

## 2019-05-19 DIAGNOSIS — C4491 Basal cell carcinoma of skin, unspecified: Secondary | ICD-10-CM

## 2019-05-19 DIAGNOSIS — L918 Other hypertrophic disorders of the skin: Secondary | ICD-10-CM

## 2019-05-19 HISTORY — DX: Basal cell carcinoma of skin, unspecified: C44.91

## 2019-05-19 NOTE — Progress Notes (Signed)
New Patient Visit  Subjective  Mike Patel is a 59 y.o. male who presents for the following: Skin Tag (Present for years at back of leg, groin, areola that are irritated. ).  Patient advises spot on mid back has been there for at least 20 years.   Patient also has a spot on bottom right lip, present for a few months. Does get a scab which will bleed if he picks at.  Also has a few spots inside his mouth he would like looked at. Has had spots removed prior by ENT a few years ago but he does not remember what they were.   The following portions of the chart were reviewed this encounter and updated as appropriate:     Review of Systems:  No other skin or systemic complaints except as noted in HPI or Assessment and Plan.  Objective  Well appearing patient in no apparent distress; mood and affect are within normal limits.  All skin waist up examined.  Objective  Left Dorsal Hand x 1, left ear helix x 1 (2): Hypertrophic, keratotic plaque 1.0cm at left dorsal hand Keratotic macule left ear mid helix  Objective  Mid Lower mucosal Lip, L buccal mucosal cheek: Smooth pink white nodules R lower mucosal lip and L buccal mucosal cheek.  These were removed in past by ENT but have recurred.  Pt tends to bite areas, and they get irritated.  Objective  Neck - Posterior lower: Fatty growth L posterior neck and L lateral knee  Objective  Right Lower  Lip: 15mm pink pearly papule     Objective  Right Thigh - Posterior: 1.0cm fleshy papule     Objective  Spinal Mid Back: 1.9cm pink brown irregular patch        Assessment & Plan  AK (actinic keratosis) (2) Left Dorsal Hand x 1, left ear helix x 1  Hypertrophic  Destruction of lesion - Left Dorsal Hand x 1, left ear helix x 1  Destruction method: cryotherapy   Informed consent: discussed and consent obtained   Lesion destroyed using liquid nitrogen: Yes   Region frozen until ice ball extended beyond lesion: Yes     Outcome: patient tolerated procedure well with no complications   Post-procedure details: wound care instructions given    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    Neoplasm of uncertain behavior of skin (3) Right Lower  Lip  Skin / nail biopsy Type of biopsy: tangential   Informed consent: discussed and consent obtained   Patient was prepped and draped in usual sterile fashion: Area prepped with alcohol. Anesthesia: the lesion was anesthetized in a standard fashion   Anesthetic:  1% lidocaine w/ epinephrine 1-100,000 buffered w/ 8.4% NaHCO3 Instrument used: flexible razor blade   Hemostasis achieved with: pressure, aluminum chloride and electrodesiccation   Outcome: patient tolerated procedure well   Post-procedure details: wound care instructions given   Post-procedure details comment:  Ointment and small bandage applied  Specimen 1 - Surgical pathology Differential Diagnosis: r/o BCC Check Margins: No 77mm pink pearly papule  Right Thigh - Posterior  Skin / nail biopsy Type of biopsy: tangential   Informed consent: discussed and consent obtained   Patient was prepped and draped in usual sterile fashion: Area prepped with alcohol. Anesthesia: the lesion was anesthetized in a standard fashion   Anesthetic:  1% lidocaine w/ epinephrine 1-100,000 buffered w/ 8.4% NaHCO3 Instrument used: flexible razor blade   Hemostasis achieved with: pressure, aluminum chloride and electrodesiccation   Outcome: patient tolerated procedure well   Post-procedure details: wound care instructions given   Post-procedure details comment:  Ointment and small bandage applied  Specimen 2 - Surgical pathology Differential Diagnosis: Irritated Nevus vs Irritated Skin Tag Check Margins: No 1.0cm fleshy papule  Spinal Mid Back  Skin / nail biopsy Type of biopsy:  tangential   Informed consent: discussed and consent obtained   Patient was prepped and draped in usual sterile fashion: Area prepped with alcohol. Anesthesia: the lesion was anesthetized in a standard fashion   Anesthetic:  1% lidocaine w/ epinephrine 1-100,000 buffered w/ 8.4% NaHCO3 Instrument used: flexible razor blade   Hemostasis achieved with: pressure, aluminum chloride and electrodesiccation   Outcome: patient tolerated procedure well   Post-procedure details: wound care instructions given   Post-procedure details comment:  Ointment and small bandage applied  Specimen 3 - Surgical pathology Differential Diagnosis: SK r/o Melanoma Check Margins: No 1.9cm pink brown irregular patch  Will plan Moh's for right lower lip if +. Pt prefers [facility] in [place].    Acrochordons (Skin Tags) - Fleshy, skin-colored pedunculated papules, chest, groin - Benign appearing.  - Observe. - If desired, they can be removed with an in office procedure that is not covered by insurance. - Please call the clinic if you notice any new or changing lesions. Return if symptoms worsen or fail to improve, for pending biopsy results, pt may schedule for skin tag removal, noncovered.   Graciella Belton, RMA, am acting as scribe for [name], MD .  Documentation: I have reviewed the above documentation for accuracy and completeness, and I agree with the above.  [name], MD

## 2019-05-19 NOTE — Patient Instructions (Signed)
Cryotherapy Aftercare  . Wash gently with soap and water everyday.   . Apply Vaseline and Band-Aid daily until healed.  Wound Care Instructions  1. Cleanse wound gently with soap and water once a day then pat dry with clean gauze. Apply a thing coat of Petrolatum (petroleum jelly, "Vaseline") over the wound (unless you have an allergy to this). We recommend that you use a new, sterile tube of Vaseline. Do not pick or remove scabs. Do not remove the yellow or white "healing tissue" from the base of the wound.  2. Cover the wound with fresh, clean, nonstick gauze and secure with paper tape. You may use Band-Aids in place of gauze and tape if the would is small enough, but would recommend trimming much of the tape off as there is often too much. Sometimes Band-Aids can irritate the skin.  3. You should call the office for your biopsy report after 1 week if you have not already been contacted.  4. If you experience any problems, such as abnormal amounts of bleeding, swelling, significant bruising, significant pain, or evidence of infection, please call the office immediately.  5. FOR ADULT SURGERY PATIENTS: If you need something for pain relief you may take 1 extra strength Tylenol (acetaminophen) AND 2 Ibuprofen (200mg each) together every 4 hours as needed for pain. (do not take these if you are allergic to them or if you have a reason you should not take them.) Typically, you may only need pain medication for 1 to 3 days.     

## 2019-05-25 ENCOUNTER — Other Ambulatory Visit: Payer: Self-pay

## 2019-05-25 ENCOUNTER — Telehealth: Payer: Self-pay

## 2019-05-25 DIAGNOSIS — C4401 Basal cell carcinoma of skin of lip: Secondary | ICD-10-CM

## 2019-05-25 NOTE — Telephone Encounter (Signed)
Patient was advised by Dr Nicole Kindred of biopsy results. Referral sent to [facility] for [facility] to be performed on [facility] of the right lower lip. Surgery scheduled with Dr Chauncey Cruel for 06/29/2019 at 2:30pm for melanoma of the spinal mid back. Melanoma biopsy information faxed to [facility] for testing.

## 2019-05-26 ENCOUNTER — Telehealth: Payer: Self-pay

## 2019-05-26 NOTE — Telephone Encounter (Signed)
Patient advised of information per Dr. Nicole Kindred. He also states he has a place inside of his mouth in between the top and bottom teeth. Advised patient Dr. Nicole Kindred can look at this but depending on the location it may require a different specialty.

## 2019-05-26 NOTE — Telephone Encounter (Signed)
I believe I saw the spot on the inside of his lip already.  It looked like a mucosal fibroma.  If he would like it removed, we may be able to do it at the time of the post-op suture removal the week after his surgery.

## 2019-05-26 NOTE — Telephone Encounter (Signed)
Patient left a message regarding another place on the inside of his lip. He wants to know can this be looked at/taken care of during his appointment in June.

## 2019-06-03 ENCOUNTER — Telehealth: Payer: Self-pay

## 2019-06-03 NOTE — Telephone Encounter (Signed)
Patient's [place] [facility] results are back from his melanoma testing. You will find these under his chart review, then under the media tab.

## 2019-06-08 NOTE — Telephone Encounter (Signed)
Reviewed results.  Patient is class IA, which has lowest risk of spread, and 98% 5 year survival.  We can treat melanoma here in office as scheduled with [facility].

## 2019-06-09 NOTE — Telephone Encounter (Signed)
Patient has been advised of [facility] testing. He will keep appointment for June 6th.

## 2019-06-29 ENCOUNTER — Encounter: Payer: Self-pay | Admitting: Dermatology

## 2019-06-29 ENCOUNTER — Other Ambulatory Visit: Payer: Self-pay

## 2019-06-29 ENCOUNTER — Ambulatory Visit: Payer: BC Managed Care – PPO | Admitting: Dermatology

## 2019-06-29 DIAGNOSIS — C4359 Malignant melanoma of other part of trunk: Secondary | ICD-10-CM | POA: Diagnosis not present

## 2019-06-29 DIAGNOSIS — C439 Malignant melanoma of skin, unspecified: Secondary | ICD-10-CM

## 2019-06-29 HISTORY — DX: Malignant melanoma of skin, unspecified: C43.9

## 2019-06-29 NOTE — Patient Instructions (Addendum)
Wound Care Instructions  1. Cleanse wound gently with soap and water once a day then pat dry with clean gauze. Apply a thing coat of Petrolatum (petroleum jelly, "Vaseline") over the wound (unless you have an allergy to this). We recommend that you use a new, sterile tube of Vaseline. Do not pick or remove scabs. Do not remove the yellow or white "healing tissue" from the base of the wound.  2. Cover the wound with fresh, clean, nonstick gauze and secure with paper tape. You may use Band-Aids in place of gauze and tape if the would is small enough, but would recommend trimming much of the tape off as there is often too much. Sometimes Band-Aids can irritate the skin.  3. You should call the office for your biopsy report after 1 week if you have not already been contacted.  4. If you experience any problems, such as abnormal amounts of bleeding, swelling, significant bruising, significant pain, or evidence of infection, please call the office immediately.  5. FOR ADULT SURGERY PATIENTS: If you need something for pain relief you may take 2 extra strength Tylenol (acetaminophen) AND 3 Ibuprofen (200mg  each) together every 6 hours as needed for pain. (do not take these if you are allergic to them or if you have a reason you should not take them.) Typically, you may only need pain medication for 1 to 3 days.    Doryx - Take 1/2 tablet twice a day with food and plenty of fluid. Doxycycline should be taken with food to prevent nausea. Do not lay down for 30 minutes after taking. Be cautious with sun exposure and use good sun protection while on this medication. Pregnant women should not take this medication.

## 2019-06-29 NOTE — Progress Notes (Signed)
   Follow-Up Visit   Subjective  Mike Patel is a 59 y.o. 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   The following portions of the chart were reviewed this encounter and updated as appropriate:      Review of Systems:  No other skin or systemic complaints except as noted in HPI or Assessment and Plan.  Objective  Well appearing patient in no apparent distress; mood and affect are within normal limits.  A focused examination was performed including back. Relevant physical exam findings are noted in the Assessment and Plan.  Objective  Spinal Mid Back: 1.9 x 1.7cm pink biopsy site.   Assessment & Plan  Melanoma of skin ([place]) Spinal Mid Back  Skin excision  Lesion length (cm):  1.9 Lesion width (cm):  1.7 Margin per side (cm):  0.5 Total excision diameter (cm):  2.9 Informed consent: discussed and consent obtained   Timeout: patient name, date of birth, surgical site, and procedure verified   Procedure prep:  Patient was prepped and draped in usual sterile fashion Prep type:  Povidone-iodine Anesthesia: the lesion was anesthetized in a standard fashion   Anesthetic:  1% lidocaine w/ epinephrine 1-100,000 buffered w/ 8.4% NaHCO3 (28cc) Instrument used: #10 blade   Hemostasis achieved with: suture, pressure and electrodesiccation   Outcome: patient tolerated procedure well with no complications    Skin repair Complexity:  Complex Final length (cm):  9 Reason for type of repair: reduce tension to allow closure, reduce the risk of dehiscence, infection, and necrosis, reduce subcutaneous dead space and avoid a hematoma, allow closure of the large defect and enhance both functionality and cosmetic results   Undermining: area extensively undermined   Subcutaneous layers (deep stitches):  Suture size:  2-0 and 3-0 Suture type: Vicryl (polyglactin 910)   Stitches:  Buried vertical mattress Fine/surface  layer approximation (top stitches):  Suture size:  3-0 Suture type comment:  Nylon Stitches: simple interrupted   Suture removal (days):  7 Hemostasis achieved with: suture, pressure and electrodesiccation Outcome: patient tolerated procedure well with no complications   Post-procedure details: sterile dressing applied and wound care instructions given   Dressing type: pressure dressing (mupirocin)   Additional details:  Superior 12 o'clock tag  Doryx 200mg  Take 1/2 tablet bid with food and plenty of fluid - samples given (Lot # [id number] Exp 05/2020) Doxycycline should be taken with food to prevent nausea. Do not lay down for 30 minutes after taking. Be cautious with sun exposure and use good sun protection while on this medication. Pregnant women should not take this medication.     Specimen 1 - Surgical pathology Differential Diagnosis: Malignant Melanoma, biopsy proven with a superior 12 o'clock tag  Check Margins: Yes 1.7 x 1.9 cm pink biopsy site [id number]  Discussed no heavy lifting, OTC pain management discussed  Return in about 1 week (around 07/06/2019) for Suture removal.   I, Mike Patel, CMA, am acting as scribe for [name], MD .  Documentation: I have reviewed the above documentation for accuracy and completeness, and I agree with the above.  [name] MD

## 2019-06-30 ENCOUNTER — Telehealth: Payer: Self-pay

## 2019-06-30 ENCOUNTER — Other Ambulatory Visit: Payer: Self-pay

## 2019-06-30 NOTE — Telephone Encounter (Signed)
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

## 2019-06-30 NOTE — Telephone Encounter (Signed)
OK, Can they print one out up front?  Or do I need to write it out on an RX pad?

## 2019-06-30 NOTE — Telephone Encounter (Signed)
Talked to patient and he is doing fine from surgery yesterday, only a little sore.  He will call office with any problems or concerns.

## 2019-06-30 NOTE — Telephone Encounter (Signed)
Abby will print pt a work release note, stating pt can go back to work 07-02-19 and leave up front office for pt to come pick up

## 2019-07-06 ENCOUNTER — Ambulatory Visit (INDEPENDENT_AMBULATORY_CARE_PROVIDER_SITE_OTHER): Payer: BC Managed Care – PPO | Admitting: Dermatology

## 2019-07-06 ENCOUNTER — Ambulatory Visit: Payer: BC Managed Care – PPO | Admitting: Dermatology

## 2019-07-06 ENCOUNTER — Other Ambulatory Visit: Payer: Self-pay

## 2019-07-06 DIAGNOSIS — D492 Neoplasm of unspecified behavior of bone, soft tissue, and skin: Secondary | ICD-10-CM

## 2019-07-06 DIAGNOSIS — C439 Malignant melanoma of skin, unspecified: Secondary | ICD-10-CM

## 2019-07-06 DIAGNOSIS — C4359 Malignant melanoma of other part of trunk: Secondary | ICD-10-CM | POA: Diagnosis not present

## 2019-07-06 NOTE — Progress Notes (Signed)
Follow-Up Visit   Subjective  Mike Patel is a 59 y.o. male who presents for the following: Severe dysplastic vs Melanoma IS (spinal mid back, bx 07/02/19) and melonam margins free (spinal mid back, 1 wk post op). Re-excision of incidental pigmented lesion that was included in melanoma excision specimen at 12 o'clock tip.  This was atypical and suspicious for melanoma IS  The following portions of the chart were reviewed this encounter and updated as appropriate:      Review of Systems:  No other skin or systemic complaints except as noted in HPI or Assessment and Plan.  Objective  Well appearing patient in no apparent distress; mood and affect are within normal limits.  A focused examination was performed including back. Relevant physical exam findings are noted in the Assessment and Plan.  Objective  Spinal mid back: Excision site healing well, Incision site is clean, dry and intact   Objective  spinal mid back: Superior tip of excision corresponding to 12:00 marked on previous excised specimen.  No lesion present at this time, but nevus on pre-op photo superior to melanoma was ~ 4 mm.     Assessment & Plan  Melanoma of skin ([place]) Spinal mid back  Skin excision  Melanoma margins free- healing well  Wound cleansed, sutures removed, wound cleansed and steri strips applied. Discussed pathology results.   Neoplasm of skin spinal mid back  Skin excision  Lesion length (cm):  0.4 Lesion width (cm):  0.4 Margin per side (cm):  0.5 Total excision diameter (cm):  1.4 Informed consent: discussed and consent obtained   Timeout: patient name, date of birth, surgical site, and procedure verified   Procedure prep:  Patient was prepped and draped in usual sterile fashion Prep type:  Povidone-iodine Anesthesia: the lesion was anesthetized in a standard fashion   Anesthetic:  1% lidocaine w/ epinephrine 1-100,000 buffered w/ 8.4% NaHCO3 (15cc) Instrument used: #15 blade     Hemostasis achieved with: pressure and electrodesiccation   Outcome: patient tolerated procedure well with no complications   Additional details:  Tag at superior 12:00  Skin repair Complexity:  Complex Final length (cm):  4 Reason for type of repair: reduce tension to allow closure, reduce the risk of dehiscence, infection, and necrosis, reduce subcutaneous dead space and avoid a hematoma and enhance both functionality and cosmetic results   Undermining: edges undermined and area extensively undermined   Undermining comment:  >1.4 cm undermined each side Subcutaneous layers (deep stitches):  Suture size:  2-0 and 3-0 Suture type: Vicryl (polyglactin 910)   Stitches:  Buried vertical mattress Fine/surface layer approximation (top stitches):  Suture size:  2-0 and 3-0 Suture type comment:  Nylon Stitches: vertical mattress and simple interrupted   Suture removal (days):  7 Hemostasis achieved with: suture, pressure and electrodesiccation Outcome: patient tolerated procedure well with no complications   Post-procedure details: sterile dressing applied and wound care instructions given   Dressing type: pressure dressing (Mupirocin)    Specimen 1 - Surgical pathology Differential Diagnosis: D48.5 Severe Dysplastic nevus vs Melanoma IS vs other Check Margins: No Superior tip of excision corresponding to 12:00 marked on previous excised specimen.  No lesion present at this time, but nevus on pre-op photo superior to melanoma was ~ 4.67mm pigmented macule was noted clinically at tip of previous melanoma site when excised. Previous bx [id number] *Tag at superior 12:00.  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 Recommend doing other biopsies of pigmented lesions  on f/up to scout for other possible severely atypical nevi.  Start Doryx  200mg  1/2 po bid with food and drink for 3 additional days, sample x 1 given, Lot [id number], 05/2020  Return in about 8 days (around 07/14/2019) for suture removal and bxs.  I, Othelia Pulling, RMA, am acting as scribe for [name], MD .  Documentation: I have reviewed the above documentation for accuracy and completeness, and I agree with the above.  [name] MD

## 2019-07-06 NOTE — Patient Instructions (Addendum)
Wound Care Instructions  1. Cleanse wound gently with soap and water once a day then pat dry with clean gauze. Apply a thing coat of Petrolatum (petroleum jelly, "Vaseline") over the wound (unless you have an allergy to this). We recommend that you use a new, sterile tube of Vaseline. Do not pick or remove scabs. Do not remove the yellow or white "healing tissue" from the base of the wound.  2. Cover the wound with fresh, clean, nonstick gauze and secure with paper tape. You may use Band-Aids in place of gauze and tape if the would is small enough, but would recommend trimming much of the tape off as there is often too much. Sometimes Band-Aids can irritate the skin.  3. You should call the office for your biopsy report after 1 week if you have not already been contacted.  4. If you experience any problems, such as abnormal amounts of bleeding, swelling, significant bruising, significant pain, or evidence of infection, please call the office immediately.  5. FOR ADULT SURGERY PATIENTS: If you need something for pain relief you may take 1 extra strength Tylenol (acetaminophen) AND 2 Ibuprofen (200mg  each) together every 4 hours as needed for pain. (do not take these if you are allergic to them or if you have a reason you should not take them.) Typically, you may only need pain medication for 1 to 3 days.      Doryx 200mg   Take 1/2 pill 2 times a day with food and drink for 3 days.

## 2019-07-07 ENCOUNTER — Telehealth: Payer: Self-pay

## 2019-07-07 NOTE — Telephone Encounter (Signed)
Lft pt msg to call if any problems after yesterdays surgery./sh 

## 2019-07-14 ENCOUNTER — Ambulatory Visit (INDEPENDENT_AMBULATORY_CARE_PROVIDER_SITE_OTHER): Payer: BC Managed Care – PPO | Admitting: Dermatology

## 2019-07-14 ENCOUNTER — Other Ambulatory Visit: Payer: Self-pay

## 2019-07-14 DIAGNOSIS — Z85820 Personal history of malignant melanoma of skin: Secondary | ICD-10-CM

## 2019-07-14 DIAGNOSIS — Z4802 Encounter for removal of sutures: Secondary | ICD-10-CM

## 2019-07-14 DIAGNOSIS — D485 Neoplasm of uncertain behavior of skin: Secondary | ICD-10-CM | POA: Diagnosis not present

## 2019-07-14 DIAGNOSIS — D239 Other benign neoplasm of skin, unspecified: Secondary | ICD-10-CM

## 2019-07-14 HISTORY — DX: Other benign neoplasm of skin, unspecified: D23.9

## 2019-07-14 NOTE — Patient Instructions (Signed)

## 2019-07-14 NOTE — Progress Notes (Signed)
Follow-Up Visit   Subjective  Mike Patel is a 59 y.o. male who presents for the following: Follow-up.  Patient here today for suture removal. Path residual melanoma 0.4 mm- not upstaged from prior case, margins free.  He has several other moles on back to biopsy today.  The following portions of the chart were reviewed this encounter and updated as appropriate:      Review of Systems:  No other skin or systemic complaints except as noted in HPI or Assessment and Plan.  Objective  Well appearing patient in no apparent distress; mood and affect are within normal limits.  A focused examination was performed including face, neck, chest and back. Relevant physical exam findings are noted in the Assessment and Plan.  Objective  Left Lower Back: 69mm red pedunculated papule     Objective  Left Mid Back: 61mm medium brown macule with slight irregular border     Objective  Right Spinal Mid Back: 38mm brown macule with slight irregular pigment     Objective  Right Upper Arm - Posterior: 60mm brown macule      Assessment & Plan  Neoplasm of uncertain behavior of skin (4) Left Lower Back  Skin / nail biopsy Type of biopsy: tangential   Informed consent: discussed and consent obtained   Patient was prepped and draped in usual sterile fashion: Area prepped with alcohol. Anesthesia: the lesion was anesthetized in a standard fashion   Anesthetic:  1% lidocaine w/ epinephrine 1-100,000 buffered w/ 8.4% NaHCO3 Instrument used: flexible razor blade   Hemostasis achieved with: pressure, aluminum chloride and electrodesiccation   Outcome: patient tolerated procedure well   Post-procedure details: wound care instructions given   Post-procedure details comment:  Ointment and small bandage applied  Specimen 1 - Surgical pathology Differential Diagnosis: r/o Irritated Hemangioma Check Margins: No 54mm red pedunculated papule  Left Mid Back  Skin / nail biopsy Type of  biopsy: tangential   Informed consent: discussed and consent obtained   Patient was prepped and draped in usual sterile fashion: Area prepped with alcohol. Anesthesia: the lesion was anesthetized in a standard fashion   Anesthetic:  1% lidocaine w/ epinephrine 1-100,000 buffered w/ 8.4% NaHCO3 Instrument used: flexible razor blade   Hemostasis achieved with: pressure, aluminum chloride and electrodesiccation   Outcome: patient tolerated procedure well   Post-procedure details: wound care instructions given   Post-procedure details comment:  Ointment and small bandage applied  Specimen 2 - Surgical pathology Differential Diagnosis: Nevus r/o Dysplasia Check Margins: No 26mm medium brown macule  Right Spinal Mid Back  Skin / nail biopsy Type of biopsy: tangential   Informed consent: discussed and consent obtained   Patient was prepped and draped in usual sterile fashion: Area prepped with alcohol. Anesthesia: the lesion was anesthetized in a standard fashion   Anesthetic:  1% lidocaine w/ epinephrine 1-100,000 buffered w/ 8.4% NaHCO3 Instrument used: flexible razor blade   Hemostasis achieved with: pressure, aluminum chloride and electrodesiccation   Outcome: patient tolerated procedure well   Post-procedure details: wound care instructions given   Post-procedure details comment:  Ointment and small bandage applied  Specimen 3 - Surgical pathology Differential Diagnosis: Nevus r/o dysplasia Check Margins: No 56mm brown macule with slight irregular pigment  Right Upper Arm - Posterior  Skin / nail biopsy Type of biopsy: tangential   Informed consent: discussed and consent obtained   Patient was prepped and draped in usual sterile fashion: Area prepped with alcohol. Anesthesia: the lesion  was anesthetized in a standard fashion   Anesthetic:  1% lidocaine w/ epinephrine 1-100,000 buffered w/ 8.4% NaHCO3 Instrument used: flexible razor blade   Hemostasis achieved with: pressure,  aluminum chloride and electrodesiccation   Outcome: patient tolerated procedure well   Post-procedure details: wound care instructions given   Post-procedure details comment:  Ointment and small bandage applied  Specimen 4 - Surgical pathology Differential Diagnosis: Nevus r/o Dysplasia Check Margins: No 22mm brown macule   History of Melanoma - Margins free on re-excision - No evidence of recurrence today Patient presents for suture removal. The wound is well healed without signs of infection. Wound cleansed, sutures removed, wound cleansed and steri strips applied. Discussed pathology results. - Recommend regular full body skin exams - Recommend daily broad spectrum sunscreen SPF 30+ to sun-exposed areas, reapply every 2 hours as needed.  - Call if any new or changing lesions are noted between office visits  Return in about 3 months (around 10/14/2019) for TBSE.  Graciella Belton, RMA, am acting as scribe for [name], MD .  Documentation: I have reviewed the above documentation for accuracy and completeness, and I agree with the above.  [name] MD

## 2019-07-16 ENCOUNTER — Telehealth: Payer: Self-pay

## 2019-07-16 NOTE — Telephone Encounter (Signed)
Pt advised of bx results./sh 

## 2019-07-16 NOTE — Telephone Encounter (Signed)
-----   Message from [name], MD sent at 07/15/2019  6:59 PM EDT ----- 1. Skin , left lower back HEMANGIOMA 2. Skin , left mid back DYSPLASTIC COMPOUND NEVUS WITH MODERATE ATYPIA, LIMITED MARGINS FREE 3. Skin , right spinal mid back DYSPLASTIC COMPOUND NEVUS WITH MODERATE ATYPIA, INFLAMED, DEEP MARGIN INVOLVED 4. Skin , right upper arm - posterior DYSPLASTIC COMPOUND NEVUS WITH MODERATE ATYPIA, DEEP MARGIN INVOLVED  1. Benign 2-4. Atypical moles, but not cancer.  Will monitor sites- no further surgery needed.

## 2019-10-19 ENCOUNTER — Ambulatory Visit: Payer: BC Managed Care – PPO | Admitting: Dermatology

## 2019-10-19 ENCOUNTER — Other Ambulatory Visit: Payer: Self-pay

## 2019-10-19 ENCOUNTER — Encounter: Payer: Self-pay | Admitting: Dermatology

## 2019-10-19 DIAGNOSIS — L219 Seborrheic dermatitis, unspecified: Secondary | ICD-10-CM | POA: Diagnosis not present

## 2019-10-19 DIAGNOSIS — D485 Neoplasm of uncertain behavior of skin: Secondary | ICD-10-CM | POA: Diagnosis not present

## 2019-10-19 DIAGNOSIS — L570 Actinic keratosis: Secondary | ICD-10-CM | POA: Diagnosis not present

## 2019-10-19 DIAGNOSIS — Z86018 Personal history of other benign neoplasm: Secondary | ICD-10-CM

## 2019-10-19 DIAGNOSIS — Z1283 Encounter for screening for malignant neoplasm of skin: Secondary | ICD-10-CM

## 2019-10-19 DIAGNOSIS — L918 Other hypertrophic disorders of the skin: Secondary | ICD-10-CM | POA: Diagnosis not present

## 2019-10-19 DIAGNOSIS — D239 Other benign neoplasm of skin, unspecified: Secondary | ICD-10-CM

## 2019-10-19 DIAGNOSIS — D2371 Other benign neoplasm of skin of right lower limb, including hip: Secondary | ICD-10-CM

## 2019-10-19 DIAGNOSIS — Z85820 Personal history of malignant melanoma of skin: Secondary | ICD-10-CM | POA: Diagnosis not present

## 2019-10-19 DIAGNOSIS — D492 Neoplasm of unspecified behavior of bone, soft tissue, and skin: Secondary | ICD-10-CM

## 2019-10-19 DIAGNOSIS — D1800 Hemangioma unspecified site: Secondary | ICD-10-CM

## 2019-10-19 DIAGNOSIS — L821 Other seborrheic keratosis: Secondary | ICD-10-CM

## 2019-10-19 MED ORDER — HYDROCORTISONE 2.5 % EX CREA: TOPICAL_CREAM | CUTANEOUS | 1 refills | Status: DC

## 2019-10-19 NOTE — Patient Instructions (Signed)
Wound Care Instructions  1. Cleanse wound gently with soap and water once a day then pat dry with clean gauze. Apply a thing coat of Petrolatum (petroleum jelly, "Vaseline") over the wound (unless you have an allergy to this). We recommend that you use a new, sterile tube of Vaseline. Do not pick or remove scabs. Do not remove the yellow or white "healing tissue" from the base of the wound.  2. You should call the office for your biopsy report after 1 week if you have not already been contacted.  3. If you experience any problems, such as abnormal amounts of bleeding, swelling, significant bruising, significant pain, or evidence of infection, please call the office immediately.  4. FOR ADULT SURGERY PATIENTS: If you need something for pain relief you may take 1 extra strength Tylenol (acetaminophen) AND 2 Ibuprofen (200mg each) together every 4 hours as needed for pain. (do not take these if you are allergic to them or if you have a reason you should not take them.) Typically, you may only need pain medication for 1 to 3 days.     

## 2019-10-19 NOTE — Progress Notes (Signed)
Follow-Up Visit   Subjective  Mike Patel is a 59 y.o. male who presents for the following: Annual Exam (1m f/u total body skin exam, hx of Melanoma, dysplastic nevi).  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 He has a couple of bumps in the mouth that he tends to bite because they stick out.   The following portions of the chart were reviewed this encounter and updated as appropriate:      Review of Systems:  No other skin or systemic complaints except as noted in HPI or Assessment and Plan.  Objective  Well appearing patient in no apparent distress; mood and affect are within normal limits.  A full examination was performed including scalp, head, eyes, ears, nose, lips, neck, chest, axillae, abdomen, back, buttocks, bilateral upper extremities, bilateral lower extremities, hands, feet, fingers, toes, fingernails, and toenails. All findings within normal limits unless otherwise noted below.  Objective  Spinal Mid Back: Well healed scar with no evidence of recurrence, no lymphadenopathy.   Objective  R temple: Pink scaly patch  Objective  Left nipple, bil axilla, neck: Fleshy, skin-colored pedunculated papules.    Objective  crown scalp x 5 (5): Pink scaly macules   Objective  R pretibia: Firm pink/brown papulenodule with dimple sign.   Objective  central mucosal lower lip: 5.19mm firm flesh pap central mucosal lower lip  Objective  Left buccal mucosa: 7.1mm firm flesh nodule   Assessment & Plan    Hemangiomas - Red papules - Discussed benign nature - Observe - Call for any changes  History of Dysplastic Nevi - No evidence of recurrence today - L mid back, R spinal mid back, R upper arm - Recommend regular full body skin exams - Recommend daily broad spectrum sunscreen SPF 30+ to sun-exposed areas, reapply every 2 hours as needed.  - Call if any new or changing lesions are noted between office visits   Seborrheic Keratoses - Stuck-on, waxy,  tan-brown papules and plaques  - Discussed benign etiology and prognosis. - Observe - Call for any changes   History of melanoma Spinal Mid Back  05/19/19 [place] level II, [place] 0.26mm s/p [facility] [place] Testing Class 1A  Clear. Observe for recurrence. Call clinic for new or changing lesions.  Recommend regular skin exams, daily broad-spectrum spf 30+ sunscreen use, and photoprotection.     Seborrheic dermatitis R temple  Start HC 2.5% cr qd/bid prn flares,   hydrocortisone 2.5 % cream - R temple  Skin tag Left nipple, bil axilla, neck  Discussed Ln2 vrs snip excision, $115 for up to 15 to treat as non covered.  Pt defers for now.   AK (actinic keratosis) (5) crown scalp x 5  Destruction of lesion - crown scalp x 5  Destruction method: cryotherapy   Informed consent: discussed and consent obtained   Timeout:  patient name, date of birth, surgical site, and procedure verified Lesion destroyed using liquid nitrogen: Yes   Region frozen until ice ball extended beyond lesion: Yes   Outcome: patient tolerated procedure well with no complications   Post-procedure details: wound care instructions given    Dermatofibroma R pretibia  Benign-appearing.  Observation.  Call clinic for new or changing lesions.  Recommend daily use of broad spectrum spf 30+ sunscreen to sun-exposed areas.    Neoplasm of skin (2) central mucosal lower lip  Epidermal / dermal shaving  Lesion diameter (cm):  0.5 Informed consent: discussed and consent obtained   Patient was prepped  and draped in usual sterile fashion: Area prepped with alcohol. Anesthesia: the lesion was anesthetized in a standard fashion   Anesthetic:  1% lidocaine w/ epinephrine 1-100,000 buffered w/ 8.4% NaHCO3 Instrument used: #15 blade and scissors   Hemostasis achieved with: pressure, aluminum chloride and electrodesiccation   Outcome: patient tolerated procedure well   Post-procedure details: wound care instructions given    Post-procedure details comment:  Ointment applied  Specimen 1 - Surgical pathology Differential Diagnosis: Mucosal Fibroma/Bite Fibroma vs other Check Margins: No 5.79mm firm flesh pap central mucosal lower lip  Left buccal mucosa  Epidermal / dermal shaving  Lesion diameter (cm):  0.7 Informed consent: discussed and consent obtained   Patient was prepped and draped in usual sterile fashion: Area prepped with alcohol. Anesthesia: the lesion was anesthetized in a standard fashion   Anesthetic:  1% lidocaine w/ epinephrine 1-100,000 buffered w/ 8.4% NaHCO3 Instrument used: #15 blade and scissors   Hemostasis achieved with: pressure, aluminum chloride and electrodesiccation   Outcome: patient tolerated procedure well   Post-procedure details: wound care instructions given   Post-procedure details comment:  Ointment applied  Specimen 2 - Surgical pathology Differential Diagnosis: Mucosal Fibroma/Bite Fibroma vs other Check Margins: No 7.42mm firm flesh nodule  Mucosal Fibroma/Bite Fibroma vs other bx x 2 to Central mucosal lower lip, L buccal mucosa   Return in 6 months (on 04/17/2020).   I, Othelia Pulling, RMA, am acting as scribe for [name], MD . Documentation: I have reviewed the above documentation for accuracy and completeness, and I agree with the above.  [name] MD

## 2019-10-26 ENCOUNTER — Telehealth: Payer: Self-pay

## 2019-10-26 NOTE — Telephone Encounter (Signed)
Patient advised biopsies benign, JS

## 2019-10-26 NOTE — Telephone Encounter (Signed)
-----   Message from [name], MD sent at 10/26/2019  9:37 AM EDT ----- 1. Skin , central mucosal lower lip MUCOSAL FIBROMA 2. Skin , left buccal mucosa MUCOSAL FIBROMA  Both benign

## 2020-01-24 ENCOUNTER — Emergency Department
Admission: EM | Admit: 2020-01-24 | Discharge: 2020-01-24 | Disposition: A | Discharge status: Home / Self Care | Payer: BC Managed Care – PPO | Attending: Emergency Medicine | Admitting: Emergency Medicine

## 2020-01-24 ENCOUNTER — Encounter: Payer: Self-pay | Admitting: Emergency Medicine

## 2020-01-24 ENCOUNTER — Other Ambulatory Visit: Payer: Self-pay

## 2020-01-24 ENCOUNTER — Emergency Department: Payer: BC Managed Care – PPO

## 2020-01-24 DIAGNOSIS — Z20822 Contact with and (suspected) exposure to covid-19: Secondary | ICD-10-CM | POA: Insufficient documentation

## 2020-01-24 DIAGNOSIS — Z79899 Other long term (current) drug therapy: Secondary | ICD-10-CM | POA: Diagnosis not present

## 2020-01-24 DIAGNOSIS — R6883 Chills (without fever): Secondary | ICD-10-CM | POA: Diagnosis present

## 2020-01-24 DIAGNOSIS — Z7984 Long term (current) use of oral hypoglycemic drugs: Secondary | ICD-10-CM | POA: Diagnosis not present

## 2020-01-24 DIAGNOSIS — I10 Essential (primary) hypertension: Secondary | ICD-10-CM | POA: Diagnosis not present

## 2020-01-24 DIAGNOSIS — B349 Viral infection, unspecified: Secondary | ICD-10-CM

## 2020-01-24 DIAGNOSIS — R7303 Prediabetes: Secondary | ICD-10-CM | POA: Insufficient documentation

## 2020-01-24 DIAGNOSIS — Z7982 Long term (current) use of aspirin: Secondary | ICD-10-CM | POA: Diagnosis not present

## 2020-01-24 LAB — CBC WITH DIFFERENTIAL/PLATELET
Abs Immature Granulocytes: 0.03 10*3/uL (ref 0.00–0.07)
Basophils Absolute: 0 10*3/uL (ref 0.0–0.1)
Basophils Relative: 0 %
Eosinophils Absolute: 0 10*3/uL (ref 0.0–0.5)
Eosinophils Relative: 0 %
HCT: 52.2 % — ABNORMAL HIGH (ref 39.0–52.0)
Hemoglobin: 17.4 g/dL — ABNORMAL HIGH (ref 13.0–17.0)
Immature Granulocytes: 0 %
Lymphocytes Relative: 20 %
Lymphs Abs: 1.8 10*3/uL (ref 0.7–4.0)
MCH: 30.7 pg (ref 26.0–34.0)
MCHC: 33.3 g/dL (ref 30.0–36.0)
MCV: 92.1 fL (ref 80.0–100.0)
Monocytes Absolute: 0.8 10*3/uL (ref 0.1–1.0)
Monocytes Relative: 9 %
Neutro Abs: 6.3 10*3/uL (ref 1.7–7.7)
Neutrophils Relative %: 71 %
Platelets: 254 10*3/uL (ref 150–400)
RBC: 5.67 MIL/uL (ref 4.22–5.81)
RDW: 13.1 % (ref 11.5–15.5)
WBC: 9 10*3/uL (ref 4.0–10.5)
nRBC: 0 % (ref 0.0–0.2)

## 2020-01-24 LAB — COMPREHENSIVE METABOLIC PANEL
ALT: 29 U/L (ref 0–44)
AST: 23 U/L (ref 15–41)
Albumin: 4.4 g/dL (ref 3.5–5.0)
Alkaline Phosphatase: 56 U/L (ref 38–126)
Anion gap: 10 (ref 5–15)
BUN: 10 mg/dL (ref 6–20)
CO2: 27 mmol/L (ref 22–32)
Calcium: 9.1 mg/dL (ref 8.9–10.3)
Chloride: 100 mmol/L (ref 98–111)
Creatinine, Ser: 0.86 mg/dL (ref 0.61–1.24)
GFR, Estimated: >60 mL/min (ref >60)
Glucose, Bld: 139 mg/dL — ABNORMAL HIGH (ref 70–99)
Potassium: 3.9 mmol/L (ref 3.5–5.1)
Sodium: 137 mmol/L (ref 135–145)
Total Bilirubin: 0.8 mg/dL (ref 0.3–1.2)
Total Protein: 7.4 g/dL (ref 6.5–8.1)

## 2020-01-24 LAB — URINALYSIS, COMPLETE (UACMP) WITH MICROSCOPIC
Bacteria, UA: NONE SEEN
Bilirubin Urine: NEGATIVE
Glucose, UA: 50 mg/dL — AB
Hgb urine dipstick: NEGATIVE
Ketones, ur: NEGATIVE mg/dL
Leukocytes,Ua: NEGATIVE
Nitrite: NEGATIVE
Protein, ur: 30 mg/dL — AB
Specific Gravity, Urine: 1.019 (ref 1.005–1.030)
Squamous Epithelial / HPF: NONE SEEN (ref 0–5)
pH: 6 (ref 5.0–8.0)

## 2020-01-24 LAB — RESP PANEL BY RT-PCR (FLU A&B, COVID) ARPGX2
Influenza A by PCR: NEGATIVE
Influenza B by PCR: NEGATIVE
SARS Coronavirus 2 by RT PCR: NEGATIVE

## 2020-01-24 LAB — TROPONIN I (HIGH SENSITIVITY): Troponin I (High Sensitivity): 7 ng/L (ref <18)

## 2020-01-24 LAB — POC SARS CORONAVIRUS 2 AG -  ED: SARS Coronavirus 2 Ag: NEGATIVE

## 2020-01-24 NOTE — ED Provider Notes (Signed)
[facility] Emergency Department Provider Note  ____________________________________________  Time seen: Approximately 1:47 PM  I have reviewed the triage vital signs and the nursing notes.   HISTORY  Chief Complaint Chills    HPI Mike Patel is a 60 y.o. male that presents to the emergency department for evaluation of chills for 1 day.  Patient had difficulty sleeping last night due to the chills.  He is unsure of fever.  He denies any associated additional symptoms.  No headache, nasal congestion, sore throat, cough, shortness breath, chest pain, abdominal pain.  Past Medical History:  Diagnosis Date  . Arthritis    BACK  . Basal cell carcinoma 05/19/2019   Right lower lip. Nodular and infiltrative.  Marland Kitchen Dysplastic nevus 07/14/2019   Left mid back. Moderate atypia, limited margins free.  Marland Kitchen Dysplastic nevus 07/14/2019   Right spinal mid back. Moderate atypia, deep margin involved.  Marland Kitchen Dysplastic nevus 07/14/2019   Right upper arm. Moderate atypia, deep margin involved.   Marland Kitchen GERD (gastroesophageal reflux disease)   . Hypertension   . Melanoma ([place]) 06/29/2019   0.5m Level II,  spinal mid back WLE, [place]  . Obesity   . Pre-diabetes   . Sleep apnea    cpap    Patient Active Problem List   Diagnosis Date Noted  . GERD (gastroesophageal reflux disease) 11/26/2017  . Gout 11/26/2017  . History of degenerative disc disease 11/26/2017  . Trochanteric bursitis 11/26/2017  . Calculus of common duct   . Elevated liver enzymes   . Abnormal findings on diagnostic imaging of liver   . Acute gallstone pancreatitis 11/15/2017  . Lumbar stenosis with neurogenic claudication 05/07/2016  . Encounter for long-term (current) use of medications 08/18/2014  . Chronic prostatitis 09/29/2013  . Prostate disorder with lower urinary tract symptoms 09/21/2013  . OSA on CPAP 09/01/2013  . Incomplete emptying of bladder 06/01/2013  . Benign localized prostatic  hyperplasia with lower urinary tract symptoms (LUTS) 02/18/2012  . 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 Hypogonadism male 02/18/2012  . Spermatocele 02/18/2012  . HTN (hypertension) 01/28/2012  . Obesity 01/28/2012  . Tennis elbow 04/02/1997    Past Surgical History:  Procedure Laterality Date  . BACK SURGERY     x3  . CHOLECYSTECTOMY N/A 12/02/2017   Procedure: LAPAROSCOPIC CHOLECYSTECTOMY WITH INTRAOPERATIVE CHOLANGIOGRAM;  Surgeon: POlean Ree MD;  Location: [facility] ORS;  Service: General;  Laterality: N/A;  . COLONOSCOPY N/A 05/24/2014   Procedure: COLONOSCOPY;  Surgeon: PHulen Luster MD;  [facility];  Service: Gastroenterology;  Laterality: N/A;  . ELBOW SURGERY Right    tennis elbow  . ERCP N/A 11/18/2017   Procedure: ENDOSCOPIC RETROGRADE CHOLANGIOPANCREATOGRAPHY (ERCP);  Surgeon: WLucilla Lame MD;  [facility];  Service: Endoscopy;  Laterality: N/A;  . ESOPHAGOGASTRODUODENOSCOPY N/A 05/24/2014   Procedure: ESOPHAGOGASTRODUODENOSCOPY (EGD);  Surgeon: PHulen Luster MD;  [facility];  Service: Gastroenterology;  Laterality: N/A;  . PROSTATE SURGERY    . SPINE SURGERY     l-spine x 2    Prior to Admission medications   Medication Sig Start Date End Date Taking? Authorizing Provider  amLODipine (NORVASC) 10 MG tablet Take 10 mg by mouth every morning.     [provider]  aspirin EC 81 MG tablet Take 81 mg by mouth daily.    [provider]  atorvastatin (LIPITOR) 20 MG tablet Take by mouth. 05/11/19 05/10/20  [provider]  Cholecalciferol (VITAMIN  D-3) 125 MCG (5000 UT) TABS Take 5,000 Units by mouth 2 (two) times daily.    [provider]  Cyanocobalamin 1000 MCG/ML KIT Inject 1,000 mcg into the muscle every 30 (thirty) days.  08/14/17   [provider]  fluticasone (FLONASE) 50 MCG/ACT nasal spray Place 2 sprays into both nostrils daily.    [provider]  hydrocortisone  2.5 % cream Apply topically as directed. Qd to bid aa scaly areas on face until clear, then prn flares 10/19/19   [name], MD  ibuprofen (ADVIL,MOTRIN) 600 MG tablet Take 1 tablet (600 mg total) by mouth every 8 (eight) hours as needed. 12/02/17   Olean Ree, MD  lansoprazole (PREVACID) 30 MG capsule Take 30 mg by mouth daily before breakfast.     [provider]  lisinopril (PRINIVIL,ZESTRIL) 40 MG tablet Take 1 tablet (40 mg total) by mouth daily. Patient taking differently: Take 40 mg by mouth every morning.  11/19/17   Mayo, Pete Pelt, MD  meloxicam (MOBIC) 15 MG tablet Take 15 mg by mouth daily.    [provider]  metFORMIN (GLUCOPHAGE-XR) 500 MG 24 hr tablet SMARTSIG:2 Tablet(s) By Mouth Every Evening 04/17/19   [provider]  methocarbamol (ROBAXIN) 500 MG tablet Take 500 mg by mouth 2 (two) times daily. 04/01/19   [provider]  metoprolol succinate (TOPROL-XL) 50 MG 24 hr tablet Take 50 mg by mouth every morning. Take with or immediately following a meal.     [provider]  Omega-3 Fatty Acids (FISH OIL) 1000 MG CAPS Take 2,000 mg by mouth 2 (two) times daily.    [provider]  tadalafil (CIALIS) 20 MG tablet 1 tab 1 hour prior to intercourse 05/11/19   Stoioff, Ronda Fairly, MD  testosterone cypionate (DEPOTESTOSTERONE CYPIONATE) 200 MG/ML injection Inject 200 mg into the muscle every 14 (fourteen) days.  10/25/17   [provider]  Turmeric 500 MG CAPS Take 500 mg by mouth 2 (two) times daily.    [provider]    Allergies Percocet [oxycodone-acetaminophen] and Tape  Family History  Problem Relation Age of Onset  . Hypertension Mother     Social History Social History   Tobacco Use  . Smoking status: Never Smoker  . Smokeless tobacco: Never Used  Vaping Use  . Vaping Use: Never used  Substance Use Topics  . Alcohol use: No  . Drug use: No     Review of Systems  Constitutional: Positive  for chills. ENT: No upper respiratory complaints. Cardiovascular: No chest pain. Respiratory: No cough. No SOB. Gastrointestinal: No abdominal pain.  No nausea, no vomiting.  Musculoskeletal: Negative for musculoskeletal pain. Skin: Negative for rash, abrasions, lacerations, ecchymosis. Neurological: Negative for headaches, numbness or tingling   ____________________________________________   PHYSICAL EXAM:  VITAL SIGNS: ED Triage Vitals  Enc Vitals Group     BP 01/24/20 1155 (!) 190/100     Pulse Rate 01/24/20 1153 (!) 101     Resp 01/24/20 1153 16     Temp 01/24/20 1153 98.5 F (36.9 C)     Temp Source 01/24/20 1153 Oral     SpO2 01/24/20 1153 95 %     Weight 01/24/20 1153 (!) 320 lb (145.2 kg)     Height 01/24/20 1153 _0  (1.727 m)     Head Circumference --      Peak Flow --      Pain Score 01/24/20 1153 0     Pain  Loc --      Pain Edu? --      Excl. in [place]? --      Constitutional: Alert and oriented. Well appearing and in no acute distress. Eyes: Conjunctivae are normal. PERRL. EOMI. Head: Atraumatic. ENT:      Ears:      Nose: No congestion/rhinnorhea.      Mouth/Throat: Mucous membranes are moist.  Neck: No stridor. Cardiovascular: Normal rate, regular rhythm.  Good peripheral circulation. Respiratory: Normal respiratory effort without tachypnea or retractions. Lungs CTAB. Good air entry to the bases with no decreased or absent breath sounds. Gastrointestinal: Bowel sounds 4 quadrants. Soft and nontender to palpation. No guarding or rigidity. No palpable masses. No distention.  Musculoskeletal: Full range of motion to all extremities. No gross deformities appreciated. Neurologic:  Normal speech and language. No gross focal neurologic deficits are appreciated.  Skin:  Skin is warm, dry and intact. No rash noted. Psychiatric: Mood and affect are normal. Speech and behavior are normal. Patient exhibits appropriate insight and  judgement.   ____________________________________________   LABS (all labs ordered are listed, but only abnormal results are displayed)  Labs Reviewed  CBC WITH DIFFERENTIAL/PLATELET - Abnormal; Notable for the following components:      Result Value   Hemoglobin 17.4 (*)    HCT 52.2 (*)    All other components within normal limits  COMPREHENSIVE METABOLIC PANEL - Abnormal; Notable for the following components:   Glucose, Bld 139 (*)    All other components within normal limits  URINALYSIS, COMPLETE (UACMP) WITH MICROSCOPIC - Abnormal; Notable for the following components:   Color, Urine YELLOW (*)    APPearance CLEAR (*)    Glucose, UA 50 (*)    Protein, ur 30 (*)    All other components within normal limits  RESP PANEL BY RT-PCR (FLU A&B, COVID) ARPGX2  POC SARS CORONAVIRUS 2 AG -  ED  TROPONIN I (HIGH SENSITIVITY)   ____________________________________________  EKG  SR ____________________________________________  RADIOLOGY  IMPRESSION:  Negative portable chest.    ____________________________________________    PROCEDURES  Procedure(s) performed:    Procedures    Medications - No data to display   ____________________________________________   INITIAL IMPRESSION / ASSESSMENT AND PLAN / ED COURSE  Pertinent labs & imaging results that were available during my care of the patient were reviewed by me and considered in my medical decision making (see chart for details).  Review of the [place] CSRS was performed in accordance of the [place] prior to dispensing any controlled drugs.   Patient's diagnosis is consistent with viral illness.  Vital signs and exam are reassuring.  Chest x-ray negative for acute cardiopulmonary processes.  Lab work is unremarkable.  Sinus rhythm on EKG.  Covid and influenza tests are negative.  Patient is stable for discharge.  Patient is to follow up with primary care as directed. Patient is given ED precautions to return to the ED  for any worsening or new symptoms.   Mike Patel was evaluated in Emergency Department on 01/25/2020 for the symptoms described in the history of present illness. He was evaluated in the context of the global COVID-19 pandemic, which necessitated consideration that the patient might be at risk for infection with the SARS-CoV-2 virus that causes COVID-19. Institutional protocols and algorithms that pertain to the evaluation of patients at risk for COVID-19 are in a state of rapid change based on information released by regulatory bodies including the CDC and federal and state  organizations. These policies and algorithms were followed during the patient's care in the ED.  ____________________________________________  FINAL CLINICAL IMPRESSION(S) / ED DIAGNOSES  Final diagnoses:  Chills  Viral illness      NEW MEDICATIONS STARTED DURING THIS VISIT:  ED Discharge Orders    None          This chart was dictated using voice recognition software/Dragon. Despite best efforts to proofread, errors can occur which can change the meaning. Any change was purely unintentional.    Laban Emperor, PA-C 01/25/20 1419    Harvest Dark, MD 01/26/20 [phone]

## 2020-01-24 NOTE — ED Triage Notes (Signed)
Pt to ED via POV c/o chills. Pt states that symptoms started yesterday. Pt states that he was unable to sleep last night because chills were so bad. Pt is in NAD.

## 2020-01-24 NOTE — ED Notes (Signed)
Pt has c/o chills x 2 days. Pt denies cough, SOB, CP, N/V. Pt ambulatory and A&O x 4 on arrival. Pt appears to be in NAD at this time. VSS. Awaiting further orders. Will continue to monitor.

## 2020-04-06 ENCOUNTER — Other Ambulatory Visit: Payer: Self-pay

## 2020-04-06 DIAGNOSIS — K8500 Idiopathic acute pancreatitis without necrosis or infection: Principal | ICD-10-CM | POA: Diagnosis present

## 2020-04-06 DIAGNOSIS — Z7984 Long term (current) use of oral hypoglycemic drugs: Secondary | ICD-10-CM

## 2020-04-06 DIAGNOSIS — Z7982 Long term (current) use of aspirin: Secondary | ICD-10-CM

## 2020-04-06 DIAGNOSIS — I10 Essential (primary) hypertension: Secondary | ICD-10-CM | POA: Diagnosis present

## 2020-04-06 DIAGNOSIS — Z6841 Body Mass Index (BMI) 40.0 and over, adult: Secondary | ICD-10-CM

## 2020-04-06 DIAGNOSIS — Z20822 Contact with and (suspected) exposure to covid-19: Secondary | ICD-10-CM | POA: Diagnosis present

## 2020-04-06 DIAGNOSIS — Z79899 Other long term (current) drug therapy: Secondary | ICD-10-CM

## 2020-04-06 DIAGNOSIS — R6510 Systemic inflammatory response syndrome (SIRS) of non-infectious origin without acute organ dysfunction: Secondary | ICD-10-CM | POA: Diagnosis present

## 2020-04-06 DIAGNOSIS — K859 Acute pancreatitis without necrosis or infection, unspecified: Secondary | ICD-10-CM | POA: Diagnosis not present

## 2020-04-06 DIAGNOSIS — I2510 Atherosclerotic heart disease of native coronary artery without angina pectoris: Secondary | ICD-10-CM | POA: Diagnosis present

## 2020-04-06 DIAGNOSIS — Z8249 Family history of ischemic heart disease and other diseases of the circulatory system: Secondary | ICD-10-CM

## 2020-04-06 DIAGNOSIS — Z885 Allergy status to narcotic agent status: Secondary | ICD-10-CM

## 2020-04-06 DIAGNOSIS — Z85820 Personal history of malignant melanoma of skin: Secondary | ICD-10-CM

## 2020-04-06 DIAGNOSIS — G4733 Obstructive sleep apnea (adult) (pediatric): Secondary | ICD-10-CM | POA: Diagnosis present

## 2020-04-06 DIAGNOSIS — K219 Gastro-esophageal reflux disease without esophagitis: Secondary | ICD-10-CM | POA: Diagnosis present

## 2020-04-06 DIAGNOSIS — E119 Type 2 diabetes mellitus without complications: Secondary | ICD-10-CM | POA: Diagnosis present

## 2020-04-06 DIAGNOSIS — K5900 Constipation, unspecified: Secondary | ICD-10-CM | POA: Diagnosis present

## 2020-04-06 DIAGNOSIS — I700 Atherosclerosis of aorta: Secondary | ICD-10-CM | POA: Diagnosis present

## 2020-04-06 DIAGNOSIS — Z91048 Other nonmedicinal substance allergy status: Secondary | ICD-10-CM

## 2020-04-06 LAB — URINALYSIS, ROUTINE W REFLEX MICROSCOPIC
Bacteria, UA: NONE SEEN
Bilirubin Urine: NEGATIVE
Glucose, UA: >=500 mg/dL — AB
Hgb urine dipstick: NEGATIVE
Ketones, ur: 5 mg/dL — AB
Leukocytes,Ua: NEGATIVE
Nitrite: NEGATIVE
Protein, ur: 100 mg/dL — AB
Specific Gravity, Urine: 1.029 (ref 1.005–1.030)
pH: 5 (ref 5.0–8.0)

## 2020-04-06 LAB — CBC WITH DIFFERENTIAL/PLATELET
Abs Immature Granulocytes: 0.1 10*3/uL — ABNORMAL HIGH (ref 0.00–0.07)
Basophils Absolute: 0 10*3/uL (ref 0.0–0.1)
Basophils Relative: 0 %
Eosinophils Absolute: 0 10*3/uL (ref 0.0–0.5)
Eosinophils Relative: 0 %
HCT: 49.5 % (ref 39.0–52.0)
Hemoglobin: 16.6 g/dL (ref 13.0–17.0)
Immature Granulocytes: 1 %
Lymphocytes Relative: 7 %
Lymphs Abs: 1.5 10*3/uL (ref 0.7–4.0)
MCH: 30.7 pg (ref 26.0–34.0)
MCHC: 33.5 g/dL (ref 30.0–36.0)
MCV: 91.5 fL (ref 80.0–100.0)
Monocytes Absolute: 1.7 10*3/uL — ABNORMAL HIGH (ref 0.1–1.0)
Monocytes Relative: 8 %
Neutro Abs: 17.7 10*3/uL — ABNORMAL HIGH (ref 1.7–7.7)
Neutrophils Relative %: 84 %
Platelets: 245 10*3/uL (ref 150–400)
RBC: 5.41 MIL/uL (ref 4.22–5.81)
RDW: 13.2 % (ref 11.5–15.5)
WBC: 21 10*3/uL — ABNORMAL HIGH (ref 4.0–10.5)
nRBC: 0 % (ref 0.0–0.2)

## 2020-04-06 LAB — BASIC METABOLIC PANEL
Anion gap: 10 (ref 5–15)
BUN: 20 mg/dL (ref 6–20)
CO2: 25 mmol/L (ref 22–32)
Calcium: 9.3 mg/dL (ref 8.9–10.3)
Chloride: 98 mmol/L (ref 98–111)
Creatinine, Ser: 0.95 mg/dL (ref 0.61–1.24)
GFR, Estimated: >60 mL/min (ref >60)
Glucose, Bld: 174 mg/dL — ABNORMAL HIGH (ref 70–99)
Potassium: 3.8 mmol/L (ref 3.5–5.1)
Sodium: 133 mmol/L — ABNORMAL LOW (ref 135–145)

## 2020-04-06 NOTE — ED Triage Notes (Signed)
Pt states last night he began to have generalized abd pain, states he feels like he is constipated. Last bm was last night. Pt denies n/v/d.

## 2020-04-07 ENCOUNTER — Emergency Department: Payer: BC Managed Care – PPO

## 2020-04-07 ENCOUNTER — Encounter: Payer: Self-pay | Admitting: Radiology

## 2020-04-07 ENCOUNTER — Inpatient Hospital Stay
Admission: EM | Admit: 2020-04-07 | Discharge: 2020-04-09 | DRG: 439 | Disposition: A | Discharge status: Home / Self Care | Payer: BC Managed Care – PPO | Attending: Internal Medicine | Admitting: Internal Medicine

## 2020-04-07 DIAGNOSIS — K859 Acute pancreatitis without necrosis or infection, unspecified: Secondary | ICD-10-CM | POA: Diagnosis present

## 2020-04-07 DIAGNOSIS — K5900 Constipation, unspecified: Secondary | ICD-10-CM | POA: Diagnosis present

## 2020-04-07 DIAGNOSIS — E119 Type 2 diabetes mellitus without complications: Secondary | ICD-10-CM

## 2020-04-07 DIAGNOSIS — I10 Essential (primary) hypertension: Secondary | ICD-10-CM | POA: Diagnosis present

## 2020-04-07 DIAGNOSIS — K8500 Idiopathic acute pancreatitis without necrosis or infection: Secondary | ICD-10-CM

## 2020-04-07 DIAGNOSIS — Z20822 Contact with and (suspected) exposure to covid-19: Secondary | ICD-10-CM | POA: Diagnosis present

## 2020-04-07 DIAGNOSIS — Z91048 Other nonmedicinal substance allergy status: Secondary | ICD-10-CM | POA: Diagnosis not present

## 2020-04-07 DIAGNOSIS — Z9049 Acquired absence of other specified parts of digestive tract: Secondary | ICD-10-CM | POA: Insufficient documentation

## 2020-04-07 DIAGNOSIS — I700 Atherosclerosis of aorta: Secondary | ICD-10-CM | POA: Diagnosis present

## 2020-04-07 DIAGNOSIS — Z7982 Long term (current) use of aspirin: Secondary | ICD-10-CM | POA: Diagnosis not present

## 2020-04-07 DIAGNOSIS — I2510 Atherosclerotic heart disease of native coronary artery without angina pectoris: Secondary | ICD-10-CM | POA: Diagnosis present

## 2020-04-07 DIAGNOSIS — R6510 Systemic inflammatory response syndrome (SIRS) of non-infectious origin without acute organ dysfunction: Secondary | ICD-10-CM

## 2020-04-07 DIAGNOSIS — Z885 Allergy status to narcotic agent status: Secondary | ICD-10-CM | POA: Diagnosis not present

## 2020-04-07 DIAGNOSIS — K219 Gastro-esophageal reflux disease without esophagitis: Secondary | ICD-10-CM | POA: Diagnosis present

## 2020-04-07 DIAGNOSIS — D72829 Elevated white blood cell count, unspecified: Secondary | ICD-10-CM

## 2020-04-07 DIAGNOSIS — Z79899 Other long term (current) drug therapy: Secondary | ICD-10-CM | POA: Diagnosis not present

## 2020-04-07 DIAGNOSIS — G4733 Obstructive sleep apnea (adult) (pediatric): Secondary | ICD-10-CM | POA: Diagnosis present

## 2020-04-07 DIAGNOSIS — Z6841 Body Mass Index (BMI) 40.0 and over, adult: Secondary | ICD-10-CM | POA: Diagnosis not present

## 2020-04-07 DIAGNOSIS — Z7984 Long term (current) use of oral hypoglycemic drugs: Secondary | ICD-10-CM | POA: Diagnosis not present

## 2020-04-07 DIAGNOSIS — Z85820 Personal history of malignant melanoma of skin: Secondary | ICD-10-CM | POA: Diagnosis not present

## 2020-04-07 DIAGNOSIS — Z8249 Family history of ischemic heart disease and other diseases of the circulatory system: Secondary | ICD-10-CM | POA: Diagnosis not present

## 2020-04-07 DIAGNOSIS — E66813 Obesity, class 3: Secondary | ICD-10-CM

## 2020-04-07 LAB — CBC
HCT: 44.9 % (ref 39.0–52.0)
Hemoglobin: 15.5 g/dL (ref 13.0–17.0)
MCH: 31.1 pg (ref 26.0–34.0)
MCHC: 34.5 g/dL (ref 30.0–36.0)
MCV: 90.2 fL (ref 80.0–100.0)
Platelets: 215 10*3/uL (ref 150–400)
RBC: 4.98 MIL/uL (ref 4.22–5.81)
RDW: 13.3 % (ref 11.5–15.5)
WBC: 18.2 10*3/uL — ABNORMAL HIGH (ref 4.0–10.5)
nRBC: 0 % (ref 0.0–0.2)

## 2020-04-07 LAB — HEPATIC FUNCTION PANEL
ALT: 21 U/L (ref 0–44)
AST: 17 U/L (ref 15–41)
Albumin: 4.1 g/dL (ref 3.5–5.0)
Alkaline Phosphatase: 39 U/L (ref 38–126)
Bilirubin, Direct: 0.2 mg/dL (ref 0.0–0.2)
Indirect Bilirubin: 1.2 mg/dL — ABNORMAL HIGH (ref 0.3–0.9)
Total Bilirubin: 1.4 mg/dL — ABNORMAL HIGH (ref 0.3–1.2)
Total Protein: 7.1 g/dL (ref 6.5–8.1)

## 2020-04-07 LAB — HEMOGLOBIN A1C
Hgb A1c MFr Bld: 6.8 % — ABNORMAL HIGH (ref 4.8–5.6)
Mean Plasma Glucose: 148.46 mg/dL

## 2020-04-07 LAB — LACTIC ACID, PLASMA: Lactic Acid, Venous: 1.5 mmol/L (ref 0.5–1.9)

## 2020-04-07 LAB — GLUCOSE, CAPILLARY: Glucose-Capillary: 157 mg/dL — ABNORMAL HIGH (ref 70–99)

## 2020-04-07 LAB — CBG MONITORING, ED
Glucose-Capillary: 139 mg/dL — ABNORMAL HIGH (ref 70–99)
Glucose-Capillary: 200 mg/dL — ABNORMAL HIGH (ref 70–99)

## 2020-04-07 LAB — RESP PANEL BY RT-PCR (FLU A&B, COVID) ARPGX2
Influenza A by PCR: NEGATIVE
Influenza B by PCR: NEGATIVE
SARS Coronavirus 2 by RT PCR: NEGATIVE

## 2020-04-07 LAB — HIV ANTIBODY (ROUTINE TESTING W REFLEX): HIV Screen 4th Generation wRfx: NONREACTIVE

## 2020-04-07 LAB — CREATININE, SERUM
Creatinine, Ser: 0.91 mg/dL (ref 0.61–1.24)
GFR, Estimated: >60 mL/min (ref >60)

## 2020-04-07 LAB — LIPASE, BLOOD: Lipase: 88 U/L — ABNORMAL HIGH (ref 11–51)

## 2020-04-07 MED ORDER — ONDANSETRON HCL 4 MG PO TABS
4.0000 mg | ORAL_TABLET | Freq: Four times a day (QID) | ORAL | Status: DC | PRN
  Administered 2020-04-07: 4 mg via ORAL
  Filled 2020-04-07: qty 1

## 2020-04-07 MED ORDER — METOPROLOL SUCCINATE ER 50 MG PO TB24
50.0000 mg | ORAL_TABLET | ORAL | Status: DC
  Administered 2020-04-07 – 2020-04-09 (×3): 50 mg via ORAL
  Filled 2020-04-07 (×3): qty 1

## 2020-04-07 MED ORDER — HYDROMORPHONE HCL 1 MG/ML IJ SOLN
1.0000 mg | Freq: Once | INTRAMUSCULAR | Status: AC
  Administered 2020-04-07: 1 mg via INTRAVENOUS
  Filled 2020-04-07: qty 1

## 2020-04-07 MED ORDER — ACETAMINOPHEN 325 MG PO TABS: 650.0000 mg | ORAL_TABLET | Freq: Four times a day (QID) | ORAL | Status: DC | PRN

## 2020-04-07 MED ORDER — MORPHINE SULFATE (PF) 2 MG/ML IV SOLN
2.0000 mg | INTRAVENOUS | Status: DC | PRN
  Administered 2020-04-07 – 2020-04-09 (×5): 2 mg via INTRAVENOUS
  Filled 2020-04-07 (×5): qty 1

## 2020-04-07 MED ORDER — BISACODYL 5 MG PO TBEC
10.0000 mg | DELAYED_RELEASE_TABLET | Freq: Once | ORAL | Status: AC
  Administered 2020-04-07: 10 mg via ORAL
  Filled 2020-04-07: qty 2

## 2020-04-07 MED ORDER — IOHEXOL 300 MG/ML  SOLN
125.0000 mL | Freq: Once | INTRAMUSCULAR | Status: AC | PRN
  Administered 2020-04-07: 125 mL via INTRAVENOUS

## 2020-04-07 MED ORDER — ENOXAPARIN SODIUM 80 MG/0.8ML ~~LOC~~ SOLN
75.0000 mg | SUBCUTANEOUS | Status: DC
  Administered 2020-04-07: 75 mg via SUBCUTANEOUS
  Filled 2020-04-07: qty 0.8

## 2020-04-07 MED ORDER — INSULIN ASPART 100 UNIT/ML ~~LOC~~ SOLN
0.0000 [IU] | Freq: Three times a day (TID) | SUBCUTANEOUS | Status: DC
  Administered 2020-04-07 – 2020-04-08 (×2): 2 [IU] via SUBCUTANEOUS
  Administered 2020-04-08: 3 [IU] via SUBCUTANEOUS
  Administered 2020-04-09: 09:00:00 2 [IU] via SUBCUTANEOUS
  Filled 2020-04-07 (×4): qty 1

## 2020-04-07 MED ORDER — ENOXAPARIN SODIUM 80 MG/0.8ML ~~LOC~~ SOLN
0.5000 mg/kg | SUBCUTANEOUS | Status: DC
  Administered 2020-04-08 – 2020-04-09 (×2): 75 mg via SUBCUTANEOUS
  Filled 2020-04-07 (×2): qty 0.8

## 2020-04-07 MED ORDER — SODIUM CHLORIDE 0.9 % IV SOLN: INTRAVENOUS | Status: DC

## 2020-04-07 MED ORDER — SODIUM CHLORIDE 0.9 % IV BOLUS (SEPSIS)
1000.0000 mL | Freq: Once | INTRAVENOUS | Status: AC
  Administered 2020-04-07: 1000 mL via INTRAVENOUS

## 2020-04-07 MED ORDER — ONDANSETRON HCL 4 MG/2ML IJ SOLN
4.0000 mg | Freq: Four times a day (QID) | INTRAMUSCULAR | Status: DC | PRN
  Administered 2020-04-07: 4 mg via INTRAVENOUS
  Filled 2020-04-07: qty 2

## 2020-04-07 MED ORDER — POLYETHYLENE GLYCOL 3350 17 G PO PACK
17.0000 g | PACK | Freq: Every day | ORAL | Status: DC
  Administered 2020-04-08 – 2020-04-09 (×2): 17 g via ORAL
  Filled 2020-04-07 (×2): qty 1

## 2020-04-07 MED ORDER — PRAVASTATIN SODIUM 10 MG PO TABS: 10.0000 mg | ORAL_TABLET | Freq: Every day | ORAL | Status: DC

## 2020-04-07 MED ORDER — AMLODIPINE BESYLATE 10 MG PO TABS
10.0000 mg | ORAL_TABLET | ORAL | Status: DC
  Administered 2020-04-07 – 2020-04-09 (×3): 10 mg via ORAL
  Filled 2020-04-07: qty 2
  Filled 2020-04-07 (×2): qty 1

## 2020-04-07 MED ORDER — LISINOPRIL 20 MG PO TABS
40.0000 mg | ORAL_TABLET | Freq: Every day | ORAL | Status: DC
  Administered 2020-04-07 – 2020-04-09 (×3): 40 mg via ORAL
  Filled 2020-04-07 (×2): qty 2
  Filled 2020-04-07: qty 4

## 2020-04-07 MED ORDER — PANTOPRAZOLE SODIUM 40 MG PO TBEC
40.0000 mg | DELAYED_RELEASE_TABLET | Freq: Every day | ORAL | Status: DC
  Administered 2020-04-07 – 2020-04-09 (×3): 40 mg via ORAL
  Filled 2020-04-07 (×3): qty 1

## 2020-04-07 MED ORDER — MORPHINE SULFATE (PF) 4 MG/ML IV SOLN
4.0000 mg | Freq: Once | INTRAVENOUS | Status: AC
  Administered 2020-04-07: 4 mg via INTRAVENOUS
  Filled 2020-04-07: qty 1

## 2020-04-07 MED ORDER — ACETAMINOPHEN 325 MG PO TABS
650.0000 mg | ORAL_TABLET | Freq: Four times a day (QID) | ORAL | Status: DC | PRN
  Administered 2020-04-08 (×2): 650 mg via ORAL
  Filled 2020-04-07 (×2): qty 2

## 2020-04-07 MED ORDER — INSULIN ASPART 100 UNIT/ML ~~LOC~~ SOLN: 0.0000 [IU] | Freq: Every day | SUBCUTANEOUS | Status: DC

## 2020-04-07 MED ORDER — ACETAMINOPHEN 650 MG RE SUPP: 650.0000 mg | Freq: Four times a day (QID) | RECTAL | Status: DC | PRN

## 2020-04-07 MED ORDER — ONDANSETRON HCL 4 MG/2ML IJ SOLN
4.0000 mg | Freq: Once | INTRAMUSCULAR | Status: AC
  Administered 2020-04-07: 4 mg via INTRAVENOUS
  Filled 2020-04-07: qty 2

## 2020-04-07 NOTE — Progress Notes (Signed)
PROGRESS NOTE    BURNHAM TROST   NWG:[id number]  DOB: Dec 17, 1960  DOA: 04/07/2020     0  PCP: Maryland Pink, MD  CC: abdominal pain  Hospital Course: Mr. Abee is a 60 yo male with PMH HTN, DMII, gallstone pancreatitis s/p CCY who presented to the ER with generalized abdominal pain and nausea associated with the pain but no vomiting.  He states he has not had any issues since his cholecystectomy approximately 2 to 3 years ago.  He did endorse some GI issues recently with Ozempic which he discontinued approximately 1 month ago. In the ER he was found to have a leukocytosis with mildly elevated total bili.  Lipase 88. CT abdomen/pelvis showed mild pancreatitis, no pseudocyst formation.  He was started on IV fluids admitted for further pain control and monitoring.   Interval History:  Seen in ER this morning. Still having abdominal pain and some nausea; denies vomiting.   ROS: Constitutional: negative for chills and fevers, Respiratory: negative for cough, Cardiovascular: negative for chest pain and Gastrointestinal: positive for abdominal pain  Assessment & Plan: * Acute pancreatitis - s/p history of gallstone pancreatitis with cholecystectomy approximately 2 to 3 years ago -Lipase 88 on admission with generalized abdominal pain and nausea -Patient recently discontinued Ozempic due to GI issues.  Etiology of patient's pancreatitis this hospitalization likely contributed to by recent use of Ozempic.  No other organic causes have been identified at this time to explain presentation -Symptoms are mild at this time; continue clear liquid diet and monitor his tolerance -Continue fluids -Continue pain and nausea control  SIRS (systemic inflammatory response syndrome) (HCC) - etiology 2/2 pancreatitis and acute illness; no concerns for infection at this time - continue to treat underlying etiology   Aortic atherosclerosis ([place]) -Seen on CT abdomen/pelvis -Continue ongoing risk factor  modifications  Diabetes mellitus without complication (HCC) -Continue SSI and CBG monitoring  Obesity, Class III, BMI 40-49.9 (morbid obesity) ([place]) -Continue ongoing lifestyle modifications as able.  Discussed with patient Body mass index is 46.78 kg/m.  OSA on CPAP - uses CPAP at home  HTN (hypertension) -Continue lisinopril and Toprol    Old records reviewed in assessment of this patient  Antimicrobials: n/a  DVT prophylaxis: Lovenox   Code Status:   Code Status: Full Code Family Communication:   Disposition Plan: Status is: Inpatient  Remains inpatient appropriate because:IV treatments appropriate due to intensity of illness or inability to take PO and Inpatient level of care appropriate due to severity of illness   Dispo: The patient is from: Home              Anticipated d/c is to: Home              Patient currently is not medically stable to d/c.   Difficult to place patient No      Risk of unplanned readmission score:     Objective: Blood pressure (!) 141/81, pulse 88, temperature 98.8 F (37.1 C), temperature source Oral, resp. rate 18, height 5\' 10"  (1.778 m), weight (!) 147.9 kg, SpO2 95 %.  Examination: General appearance: alert, cooperative and no distress Head: Normocephalic, without obvious abnormality, atraumatic Eyes: EOMI Lungs: clear to auscultation bilaterally Heart: regular rate and rhythm and S1, S2 normal Abdomen: Obese, soft, nondistended.  Bowel sounds present throughout.  Mild nonspecific tenderness but no rebound or guarding Extremities: No edema Skin: mobility and turgor normal Neurologic: Grossly normal  Consultants:     Procedures:  Data Reviewed: I have personally reviewed following labs and imaging studies Results for orders placed or performed during the hospital encounter of 04/07/20 (from the past 24 hour(s))  CBC with Differential     Status: Abnormal   Collection Time: 04/06/20  9:52 PM  Result Value Ref  Range   WBC 21.0 (H) 4.0 - 10.5 K/uL   RBC 5.41 4.22 - 5.81 MIL/uL   Hemoglobin 16.6 13.0 - 17.0 g/dL   HCT 49.5 39.0 - 52.0 %   MCV 91.5 80.0 - 100.0 fL   MCH 30.7 26.0 - 34.0 pg   MCHC 33.5 30.0 - 36.0 g/dL   RDW 13.2 11.5 - 15.5 %   Platelets 245 150 - 400 K/uL   nRBC 0.0 0.0 - 0.2 %   Neutrophils Relative % 84 %   Neutro Abs 17.7 (H) 1.7 - 7.7 K/uL   Lymphocytes Relative 7 %   Lymphs Abs 1.5 0.7 - 4.0 K/uL   Monocytes Relative 8 %   Monocytes Absolute 1.7 (H) 0.1 - 1.0 K/uL   Eosinophils Relative 0 %   Eosinophils Absolute 0.0 0.0 - 0.5 K/uL   Basophils Relative 0 %   Basophils Absolute 0.0 0.0 - 0.1 K/uL   Immature Granulocytes 1 %   Abs Immature Granulocytes 0.10 (H) 0.00 - 0.07 K/uL  Basic metabolic panel     Status: Abnormal   Collection Time: 04/06/20  9:52 PM  Result Value Ref Range   Sodium 133 (L) 135 - 145 mmol/L   Potassium 3.8 3.5 - 5.1 mmol/L   Chloride 98 98 - 111 mmol/L   CO2 25 22 - 32 mmol/L   Glucose, Bld 174 (H) 70 - 99 mg/dL   BUN 20 6 - 20 mg/dL   Creatinine, Ser 0.95 0.61 - 1.24 mg/dL   Calcium 9.3 8.9 - 10.3 mg/dL   GFR, Estimated >60 >60 mL/min   Anion gap 10 5 - 15  Urinalysis, Routine w reflex microscopic     Status: Abnormal   Collection Time: 04/06/20  9:52 PM  Result Value Ref Range   Color, Urine AMBER (A) YELLOW   APPearance HAZY (A) CLEAR   Specific Gravity, Urine 1.029 1.005 - 1.030   pH 5.0 5.0 - 8.0   Glucose, UA >=500 (A) NEGATIVE mg/dL   Hgb urine dipstick NEGATIVE NEGATIVE   Bilirubin Urine NEGATIVE NEGATIVE   Ketones, ur 5 (A) NEGATIVE mg/dL   Protein, ur 100 (A) NEGATIVE mg/dL   Nitrite NEGATIVE NEGATIVE   Leukocytes,Ua NEGATIVE NEGATIVE   RBC / HPF 0-5 0 - 5 RBC/hpf   WBC, UA 0-5 0 - 5 WBC/hpf   Bacteria, UA NONE SEEN NONE SEEN   Squamous Epithelial / LPF 0-5 0 - 5   Mucus PRESENT   Hepatic function panel     Status: Abnormal   Collection Time: 04/07/20 12:30 AM  Result Value Ref Range   Total Protein 7.1 6.5 - 8.1  g/dL   Albumin 4.1 3.5 - 5.0 g/dL   AST 17 15 - 41 U/L   ALT 21 0 - 44 U/L   Alkaline Phosphatase 39 38 - 126 U/L   Total Bilirubin 1.4 (H) 0.3 - 1.2 mg/dL   Bilirubin, Direct 0.2 0.0 - 0.2 mg/dL   Indirect Bilirubin 1.2 (H) 0.3 - 0.9 mg/dL  Lipase, blood     Status: Abnormal   Collection Time: 04/07/20 12:30 AM  Result Value Ref Range   Lipase 88 (H) 11 - 51  U/L  Lactic acid, plasma     Status: None   Collection Time: 04/07/20 12:30 AM  Result Value Ref Range   Lactic Acid, Venous 1.5 0.5 - 1.9 mmol/L  Resp Panel by RT-PCR (Flu A&B, Covid) Nasopharyngeal Swab     Status: None   Collection Time: 04/07/20  2:47 AM   Specimen: Nasopharyngeal Swab; Nasopharyngeal(NP) swabs in vial transport medium  Result Value Ref Range   SARS Coronavirus 2 by RT PCR NEGATIVE NEGATIVE   Influenza A by PCR NEGATIVE NEGATIVE   Influenza B by PCR NEGATIVE NEGATIVE  Hemoglobin A1c     Status: Abnormal   Collection Time: 04/07/20  3:23 AM  Result Value Ref Range   Hgb A1c MFr Bld 6.8 (H) 4.8 - 5.6 %   Mean Plasma Glucose 148.46 mg/dL  HIV Antibody (routine testing w rflx)     Status: None   Collection Time: 04/07/20  3:23 AM  Result Value Ref Range   HIV Screen 4th Generation wRfx Non Reactive Non Reactive  CBC     Status: Abnormal   Collection Time: 04/07/20  3:23 AM  Result Value Ref Range   WBC 18.2 (H) 4.0 - 10.5 K/uL   RBC 4.98 4.22 - 5.81 MIL/uL   Hemoglobin 15.5 13.0 - 17.0 g/dL   HCT 44.9 39.0 - 52.0 %   MCV 90.2 80.0 - 100.0 fL   MCH 31.1 26.0 - 34.0 pg   MCHC 34.5 30.0 - 36.0 g/dL   RDW 13.3 11.5 - 15.5 %   Platelets 215 150 - 400 K/uL   nRBC 0.0 0.0 - 0.2 %  Creatinine, serum     Status: None   Collection Time: 04/07/20  3:23 AM  Result Value Ref Range   Creatinine, Ser 0.91 0.61 - 1.24 mg/dL   GFR, Estimated >60 >60 mL/min  CBG monitoring, ED     Status: Abnormal   Collection Time: 04/07/20  8:24 AM  Result Value Ref Range   Glucose-Capillary 139 (H) 70 - 99 mg/dL  CBG  monitoring, ED     Status: Abnormal   Collection Time: 04/07/20  2:31 PM  Result Value Ref Range   Glucose-Capillary 200 (H) 70 - 99 mg/dL    Recent Results (from the past 240 hour(s))  Resp Panel by RT-PCR (Flu A&B, Covid) Nasopharyngeal Swab     Status: None   Collection Time: 04/07/20  2:47 AM   Specimen: Nasopharyngeal Swab; Nasopharyngeal(NP) swabs in vial transport medium  Result Value Ref Range Status   SARS Coronavirus 2 by RT PCR NEGATIVE NEGATIVE Final    Comment: (NOTE) SARS-CoV-2 target nucleic acids are NOT DETECTED.  The SARS-CoV-2 RNA is generally detectable in upper respiratory specimens during the acute phase of infection. The lowest concentration of SARS-CoV-2 viral copies this assay can detect is 138 copies/mL. A negative result does not preclude SARS-Cov-2 infection and should not be used as the sole basis for treatment or other patient management decisions. A negative result may occur with  improper specimen collection/handling, submission of specimen other than nasopharyngeal swab, presence of viral mutation(s) within the areas targeted by this assay, and inadequate number of viral copies(<138 copies/mL). A negative result must be combined with clinical observations, patient history, and epidemiological information. The expected result is Negative.  Fact Sheet for Patients:  EntrepreneurPulse.com.au  Fact Sheet for Healthcare Providers:  IncredibleEmployment.be  This test is no t yet approved or cleared by the [place] and  has been authorized  for detection and/or diagnosis of SARS-CoV-2 by FDA under an Emergency Use Authorization (EUA). This EUA will remain  in effect (meaning this test can be used) for the duration of the COVID-19 declaration under Section 564(b)(1) of the Act, 21 U.S.C.section 360bbb-3(b)(1), unless the authorization is terminated  or revoked sooner.       Influenza A by PCR NEGATIVE  NEGATIVE Final   Influenza B by PCR NEGATIVE NEGATIVE Final    Comment: (NOTE) The Xpert Xpress SARS-CoV-2/FLU/RSV plus assay is intended as an aid in the diagnosis of influenza from Nasopharyngeal swab specimens and should not be used as a sole basis for treatment. Nasal washings and aspirates are unacceptable for Xpert Xpress SARS-CoV-2/FLU/RSV testing.  Fact Sheet for Patients: EntrepreneurPulse.com.au  Fact Sheet for Healthcare Providers: IncredibleEmployment.be  This test is not yet approved or cleared by the [place] FDA and has been authorized for detection and/or diagnosis of SARS-CoV-2 by FDA under an Emergency Use Authorization (EUA). This EUA will remain in effect (meaning this test can be used) for the duration of the COVID-19 declaration under Section 564(b)(1) of the Act, 21 U.S.C. section 360bbb-3(b)(1), unless the authorization is terminated or revoked.  Performed at [facility], [address], [place], [place] [address]      Radiology Studies: CT ABDOMEN PELVIS W CONTRAST  Result Date: 04/07/2020 CLINICAL DATA:  Acute nonlocalized abdominal pain. states last night he began to have generalized abd pain, states he feels like he is constipated. EXAM: CT ABDOMEN AND PELVIS WITH CONTRAST TECHNIQUE: Multidetector CT imaging of the abdomen and pelvis was performed using the standard protocol following bolus administration of intravenous contrast. CONTRAST:  1100mL OMNIPAQUE IOHEXOL 300 MG/ML  SOLN COMPARISON:  MRI abdomen 11/16/2017 FINDINGS: Lower chest: Left lower lobe linear atelectasis versus scarring. Hepatobiliary: No focal liver abnormality is seen. Status post cholecystectomy. No biliary dilatation. Pancreas: No focal lesion. Slightly haziness to the pancreatic contour. Trace free fluid inferior to the pancreas. No main pancreatic ductal dilatation. No pseudocyst formation. Spleen: Normal in size without focal  abnormality. Adrenals/Urinary Tract: No adrenal nodule bilaterally. Bilateral kidneys enhance symmetrically. Subcentimeter hypodensities are too small to characterize. No hydronephrosis. No hydroureter. The urinary bladder is unremarkable. Stomach/Bowel: Stomach is within normal limits. No evidence of bowel wall thickening or dilatation. The appendix is normal in caliber. Vascular/Lymphatic: No abdominal aorta or iliac aneurysm. Moderate atherosclerotic plaque of the aorta and its branches. No abdominal, pelvic, or inguinal lymphadenopathy. Reproductive: Prostate is unremarkable. Other: Trace free fluid extending from the pancreas into the pelvis. No intraperitoneal free gas. No organized fluid collection. Musculoskeletal: No abdominal wall hernia or abnormality. No suspicious lytic or blastic osseous lesions. No acute displaced fracture. Multilevel degenerative changes of the spine. Posterolateral fusion of the L2 through L4 levels. IMPRESSION: 1. Mild acute pancreatitis.  No pseudocyst formation. 2. Aortic Atherosclerosis (ICD10-I70.0) including coronary artery calcifications. Electronically Signed   By: Iven Finn M.D.   On: 04/07/2020 01:25   CT ABDOMEN PELVIS W CONTRAST  Final Result      Scheduled Meds: . amLODipine  10 mg Oral BH-q7a  . [START ON 04/08/2020] enoxaparin (LOVENOX) injection  0.5 mg/kg Subcutaneous Q24H  . insulin aspart  0-15 Units Subcutaneous TID WC  . insulin aspart  0-5 Units Subcutaneous QHS  . lisinopril  40 mg Oral Daily  . metoprolol succinate  50 mg Oral BH-q7a  . pantoprazole  40 mg Oral Daily   PRN Meds: acetaminophen **OR** acetaminophen, morphine injection, ondansetron **OR**  ondansetron (ZOFRAN) IV Continuous Infusions: . sodium chloride       LOS: 0 days  Time spent: Greater than 50% of the 35 minute visit was spent in counseling/coordination of care for the patient as laid out in the A&P.   Dwyane Dee, MD [facility] 04/07/2020, 3:31 PM

## 2020-04-07 NOTE — ED Notes (Signed)
Pt requested to stand and ambulate, standing witnessed by this tech. No complaints at this time, will continue to monitor.

## 2020-04-07 NOTE — Plan of Care (Signed)

## 2020-04-07 NOTE — Assessment & Plan Note (Signed)
-   etiology 2/2 pancreatitis and acute illness; no concerns for infection at this time - continue to treat underlying etiology

## 2020-04-07 NOTE — Assessment & Plan Note (Signed)
-   uses CPAP at home

## 2020-04-07 NOTE — Progress Notes (Signed)
Pt arrived from ED for continued medical treatment, alert and oriented on room air

## 2020-04-07 NOTE — Assessment & Plan Note (Signed)
-   Continue SSI and CBG monitoring ?

## 2020-04-07 NOTE — ED Provider Notes (Signed)
[facility] Emergency Department Provider Note  ____________________________________________   Event Date/Time   First MD Initiated Contact with Patient 04/07/20 0007     (approximate)  I have reviewed the triage vital signs and the nursing notes.   HISTORY  Chief Complaint Abdominal Pain    HPI Mike Patel is a 60 y.o. male with history of obesity, hypertension, gallstone pancreatitis who presents to the emergency department with complaints of diffuse abdominal pain "like a rope tied around me" that started 1 day ago.  No fever, nausea, vomiting, diarrhea, no dysuria, hematuria, discharge.  No CP or SOB.  No BM in 24 hours.  + passing gas.  Abd surg - GB.  Denies radiation of pain.  No aggravating or alleviating factors.    Past Medical History:  Diagnosis Date  . Arthritis    BACK  . Basal cell carcinoma 05/19/2019   Right lower lip. Nodular and infiltrative.  Marland Kitchen Dysplastic nevus 07/14/2019   Left mid back. Moderate atypia, limited margins free.  Marland Kitchen Dysplastic nevus 07/14/2019   Right spinal mid back. Moderate atypia, deep margin involved.  Marland Kitchen Dysplastic nevus 07/14/2019   Right upper arm. Moderate atypia, deep margin involved.   Marland Kitchen GERD (gastroesophageal reflux disease)   . Hypertension   . Melanoma ([place]) 06/29/2019   0.15m Level II,  spinal mid back WLE, [place]  . Obesity   . Pre-diabetes   . Sleep apnea    cpap    Patient Active Problem List   Diagnosis Date Noted  . History of cholecystectomy 04/07/2020  . Obesity, Class III, BMI 40-49.9 (morbid obesity) ([place] 04/07/2020  . GERD (gastroesophageal reflux disease) 11/26/2017  . Gout 11/26/2017  . History of degenerative disc disease 11/26/2017  . Trochanteric bursitis 11/26/2017  . Calculus of common duct   . Elevated liver enzymes   . Abnormal findings on diagnostic imaging of liver   . Acute pancreatitis 11/15/2017  . Lumbar stenosis with neurogenic claudication 05/07/2016  .  Encounter for long-term (current) use of medications 08/18/2014  . Chronic prostatitis 09/29/2013  . Prostate disorder with lower urinary tract symptoms 09/21/2013  . OSA on CPAP 09/01/2013  . Incomplete emptying of bladder 06/01/2013  . Benign localized prostatic hyperplasia with lower urinary tract symptoms (LUTS) 02/18/2012  . 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 Hypogonadism male 02/18/2012  . Spermatocele 02/18/2012  . HTN (hypertension) 01/28/2012  . Obesity 01/28/2012  . Tennis elbow 04/02/1997    Past Surgical History:  Procedure Laterality Date  . BACK SURGERY     x3  . CHOLECYSTECTOMY N/A 12/02/2017   Procedure: LAPAROSCOPIC CHOLECYSTECTOMY WITH INTRAOPERATIVE CHOLANGIOGRAM;  Surgeon: POlean Ree MD;  Location: [facility] ORS;  Service: General;  Laterality: N/A;  . COLONOSCOPY N/A 05/24/2014   Procedure: COLONOSCOPY;  Surgeon: PHulen Luster MD;  [facility];  Service: Gastroenterology;  Laterality: N/A;  . ELBOW SURGERY Right    tennis elbow  . ERCP N/A 11/18/2017   Procedure: ENDOSCOPIC RETROGRADE CHOLANGIOPANCREATOGRAPHY (ERCP);  Surgeon: WLucilla Lame MD;  [facility];  Service: Endoscopy;  Laterality: N/A;  . ESOPHAGOGASTRODUODENOSCOPY N/A 05/24/2014   Procedure: ESOPHAGOGASTRODUODENOSCOPY (EGD);  Surgeon: PHulen Luster MD;  [facility];  Service: Gastroenterology;  Laterality: N/A;  . PROSTATE SURGERY    . SPINE SURGERY     l-spine x 2    Prior to Admission medications   Medication Sig Start Date End Date Taking? Authorizing Provider  amLODipine (NORVASC)  10 MG tablet Take 10 mg by mouth every morning.    Yes [provider]  aspirin EC 81 MG tablet Take 81 mg by mouth daily.   Yes [provider]  Cholecalciferol (VITAMIN D-3) 125 MCG (5000 UT) TABS Take 5,000 Units by mouth 2 (two) times daily.   Yes [provider]  Cyanocobalamin 1000 MCG/ML KIT Inject 1,000 mcg into the muscle every 30  (thirty) days.  08/14/17  Yes [provider]  fluticasone (FLONASE) 50 MCG/ACT nasal spray Place 2 sprays into both nostrils daily.   Yes [provider]  hydrochlorothiazide (HYDRODIURIL) 12.5 MG tablet Take 12.5 mg by mouth daily. 02/01/20  Yes [provider]  lansoprazole (PREVACID) 30 MG capsule Take 30 mg by mouth 2 (two) times daily before a meal.   Yes [provider]  lisinopril (PRINIVIL,ZESTRIL) 40 MG tablet Take 1 tablet (40 mg total) by mouth daily. Patient taking differently: Take 40 mg by mouth every morning. 11/19/17  Yes Mayo, Pete Pelt, MD  meloxicam (MOBIC) 15 MG tablet Take 15 mg by mouth daily.   Yes [provider]  metFORMIN (GLUCOPHAGE-XR) 500 MG 24 hr tablet SMARTSIG:2 Tablet(s) By Mouth Every Evening 04/17/19  Yes [provider]  methocarbamol (ROBAXIN) 500 MG tablet Take 500 mg by mouth 2 (two) times daily. 04/01/19  Yes [provider]  metoprolol succinate (TOPROL-XL) 50 MG 24 hr tablet Take 50 mg by mouth every morning. Take with or immediately following a meal.   Yes [provider]  Omega-3 Fatty Acids (FISH OIL) 1000 MG CAPS Take 2,000 mg by mouth 2 (two) times daily.   Yes [provider]  OZEMPIC, 0.25 OR 0.5 MG/DOSE, 2 MG/1.5ML SOPN Inject 0.25 mg into the skin once a week. 01/02/20  Yes [provider]  pravastatin (PRAVACHOL) 10 MG tablet Take 10 mg by mouth at bedtime. 01/22/20  Yes [provider]  tadalafil (CIALIS) 5 MG tablet Take 5 mg by mouth daily. 03/29/20  Yes [provider]  testosterone cypionate (DEPOTESTOSTERONE CYPIONATE) 200 MG/ML injection Inject 200 mg into the muscle every 14 (fourteen) days.  10/25/17  Yes [provider]  Turmeric 500 MG CAPS Take 500 mg by mouth 2 (two) times daily.   Yes [provider]    Allergies Percocet [oxycodone-acetaminophen] and Tape  Family History  Problem Relation Age of Onset  .  Hypertension Mother     Social History Social History   Tobacco Use  . Smoking status: Never Smoker  . Smokeless tobacco: Never Used  Vaping Use  . Vaping Use: Never used  Substance Use Topics  . Alcohol use: No  . Drug use: No    Review of Systems Constitutional: No fever. Eyes: No visual changes. ENT: No sore throat. Cardiovascular: Denies chest pain. Respiratory: Denies shortness of breath. Gastrointestinal: No nausea, vomiting, diarrhea. Genitourinary: Negative for dysuria. Musculoskeletal: Negative for back pain. Skin: Negative for rash. Neurological: Negative for focal weakness or numbness.  ____________________________________________   PHYSICAL EXAM:  VITAL SIGNS: ED Triage Vitals  Enc Vitals Group     BP 04/06/20 2146 137/86     Pulse Rate 04/06/20 2146 (!) 105     Resp 04/06/20 2146 20     Temp 04/06/20 2146 99.1 F (37.3 C)     Temp Source 04/06/20 2146 Oral     SpO2 04/06/20 2146 96 %     Weight 04/06/20 2144 (!) 326 lb (147.9 kg)  Height 04/06/20 2144 5' 10" (1.778 m)     Head Circumference --      Peak Flow --      Pain Score 04/06/20 2144 8     Pain Loc --      Pain Edu? --      Excl. in [place]? --    CONSTITUTIONAL: Alert and oriented and responds appropriately to questions.  Obese.  Appears uncomfortable. HEAD: Normocephalic EYES: Conjunctivae clear, pupils appear equal, EOM appear intact ENT: normal nose; moist mucous membranes NECK: Supple, normal ROM CARD: RRR; S1 and S2 appreciated; no murmurs, no clicks, no rubs, no gallops RESP: Normal chest excursion without splinting or tachypnea; breath sounds clear and equal bilaterally; no wheezes, no rhonchi, no rales, no hypoxia or respiratory distress, speaking full sentences ABD/GI: Normal bowel sounds; non-distended; soft, fusilli tender throughout the abdomen without guarding or rebound BACK: The back appears normal EXT: Normal ROM in all joints; no deformity noted, no edema; no  cyanosis SKIN: Normal color for age and race; warm; no rash on exposed skin NEURO: Moves all extremities equally PSYCH: The patient's mood and manner are appropriate.  ____________________________________________   LABS (all labs ordered are listed, but only abnormal results are displayed)  Labs Reviewed  CBC WITH DIFFERENTIAL/PLATELET - Abnormal; Notable for the following components:      Result Value   WBC 21.0 (*)    Neutro Abs 17.7 (*)    Monocytes Absolute 1.7 (*)    Abs Immature Granulocytes 0.10 (*)    All other components within normal limits  BASIC METABOLIC PANEL - Abnormal; Notable for the following components:   Sodium 133 (*)    Glucose, Bld 174 (*)    All other components within normal limits  URINALYSIS, ROUTINE W REFLEX MICROSCOPIC - Abnormal; Notable for the following components:   Color, Urine AMBER (*)    APPearance HAZY (*)    Glucose, UA >=500 (*)    Ketones, ur 5 (*)    Protein, ur 100 (*)    All other components within normal limits  HEPATIC FUNCTION PANEL - Abnormal; Notable for the following components:   Total Bilirubin 1.4 (*)    Indirect Bilirubin 1.2 (*)    All other components within normal limits  LIPASE, BLOOD - Abnormal; Notable for the following components:   Lipase 88 (*)    All other components within normal limits  RESP PANEL BY RT-PCR (FLU A&B, COVID) ARPGX2  LACTIC ACID, PLASMA   ____________________________________________  EKG  none ____________________________________________  RADIOLOGY I, Kristen Ward, personally viewed and evaluated these images (plain radiographs) as part of my medical decision making, as well as reviewing the written report by the radiologist.  ED MD interpretation: Acute pancreatitis.  Official radiology report(s): CT ABDOMEN PELVIS W CONTRAST  Result Date: 04/07/2020 CLINICAL DATA:  Acute nonlocalized abdominal pain. states last night he began to have generalized abd pain, states he feels like he is  constipated. EXAM: CT ABDOMEN AND PELVIS WITH CONTRAST TECHNIQUE: Multidetector CT imaging of the abdomen and pelvis was performed using the standard protocol following bolus administration of intravenous contrast. CONTRAST:  168m OMNIPAQUE IOHEXOL 300 MG/ML  SOLN COMPARISON:  MRI abdomen 11/16/2017 FINDINGS: Lower chest: Left lower lobe linear atelectasis versus scarring. Hepatobiliary: No focal liver abnormality is seen. Status post cholecystectomy. No biliary dilatation. Pancreas: No focal lesion. Slightly haziness to the pancreatic contour. Trace free fluid inferior to the pancreas. No main pancreatic ductal dilatation. No pseudocyst formation. Spleen: Normal  in size without focal abnormality. Adrenals/Urinary Tract: No adrenal nodule bilaterally. Bilateral kidneys enhance symmetrically. Subcentimeter hypodensities are too small to characterize. No hydronephrosis. No hydroureter. The urinary bladder is unremarkable. Stomach/Bowel: Stomach is within normal limits. No evidence of bowel wall thickening or dilatation. The appendix is normal in caliber. Vascular/Lymphatic: No abdominal aorta or iliac aneurysm. Moderate atherosclerotic plaque of the aorta and its branches. No abdominal, pelvic, or inguinal lymphadenopathy. Reproductive: Prostate is unremarkable. Other: Trace free fluid extending from the pancreas into the pelvis. No intraperitoneal free gas. No organized fluid collection. Musculoskeletal: No abdominal wall hernia or abnormality. No suspicious lytic or blastic osseous lesions. No acute displaced fracture. Multilevel degenerative changes of the spine. Posterolateral fusion of the L2 through L4 levels. IMPRESSION: 1. Mild acute pancreatitis.  No pseudocyst formation. 2. Aortic Atherosclerosis (ICD10-I70.0) including coronary artery calcifications. Electronically Signed   By: Iven Finn M.D.   On: 04/07/2020 01:25    ____________________________________________   PROCEDURES  Procedure(s)  performed (including Critical Care):  Procedures   ____________________________________________   INITIAL IMPRESSION / ASSESSMENT AND PLAN / ED COURSE  As part of my medical decision making, I reviewed the following data within the [place] History obtained from family, Nursing notes reviewed and incorporated, Labs reviewed , Old chart reviewed, Discussed with admitting physician  and Notes from prior ED visits         Patient here with generalized abdominal pain.  Labs obtained in triage show a leukocytosis of 21,000 with left shift.  Oral temperature is 99.2.  Will obtain lactic.  Differential includes colitis, diverticulitis, appendicitis, pancreatitis, mesenteric ischemia.  Urine shows no signs of infection not consistent with UTI, pyelonephritis.  Will give IV fluids, pain and nausea medicine.  ED PROGRESS  CT scan shows acute pancreatitis without pseudocyst or necrosis.  Patient denies history of alcohol use.  He does have history of high cholesterol but is not sure if he has had high triglycerides.  He is concerned that pain may be related to recent Ozempic use.  Patient reports his pain improved after morphine but is now coming back.  Will give Dilaudid.  We will continue IV fluids.  Discussed options of discharge home with liquid diet and pain medication versus admission for further hydration and pain control.  Patient's wife does not feel comfortable with him being discharged home at this time stating [place]  Will discuss with hospitalist for admission for IV hydration, bowel rest and further pain management.   2:49 AM Discussed patient's case with hospitalist, Dr. Damita Dunnings.  I have recommended admission and patient (and family if present) agree with this plan. Admitting physician will place admission orders.   I reviewed all nursing notes, vitals, pertinent previous records and reviewed/interpreted all EKGs, lab and urine results, imaging  (as available).   ____________________________________________   FINAL CLINICAL IMPRESSION(S) / ED DIAGNOSES  Final diagnoses:  Idiopathic acute pancreatitis without infection or necrosis  Leukocytosis, unspecified type     ED Discharge Orders    None      *Please note:  Mike Patel was evaluated in Emergency Department on 04/07/2020 for the symptoms described in the history of present illness. He was evaluated in the context of the global COVID-19 pandemic, which necessitated consideration that the patient might be at risk for infection with the SARS-CoV-2 virus that causes COVID-19. Institutional protocols and algorithms that pertain to the evaluation of patients at risk for COVID-19 are in a  state of rapid change based on information released by regulatory bodies including the CDC and federal and state organizations. These policies and algorithms were followed during the patient's care in the ED.  Some ED evaluations and interventions may be delayed as a result of limited staffing during and the pandemic.*   Note:  This document was prepared using Dragon voice recognition software and may include unintentional dictation errors.   Syanna Remmert, Delice Bison, DO 04/07/20 [phone]

## 2020-04-07 NOTE — Progress Notes (Signed)
Anticoagulation monitoring(Lovenox):  60 yo  male ordered Lovenox 40 mg Q24h  Filed Weights   04/06/20 2144  Weight: (!) 147.9 kg (326 lb)   BMI 46.78    Lab Results  Component Value Date   CREATININE 0.95 04/06/2020   CREATININE 0.86 01/24/2020   CREATININE 0.89 11/19/2017   Estimated Creatinine Clearance: 120.5 mL/min (by C-G formula based on SCr of 0.95 mg/dL). Hemoglobin & Hematocrit     Component Value Date/Time   HGB 16.6 04/06/2020 2152   HCT 49.5 04/06/2020 2152     Per Protocol for Patient with estCrcl > 30 ml/min and BMI > 40, will transition to Lovenox 75 mg Q24h.

## 2020-04-07 NOTE — ED Notes (Signed)
Patient transported to CT 

## 2020-04-07 NOTE — Assessment & Plan Note (Signed)
-  Seen on CT abdomen/pelvis -Continue ongoing risk factor modifications

## 2020-04-07 NOTE — H&P (Signed)
History and Physical    Mike Patel:[id number] DOB: [birth year] DOA: 04/07/2020  PCP: Mike Patel   Patient coming from: Home  I have personally briefly reviewed patient's old medical records in [place]  Chief Complaint: Abdominal pain  HPI: Mike Patel is a 60 y.o. male with medical history significant for DM, HTN, history of gallstone pancreatitis status post cholecystectomy, who presents to the ER with a 1 day history of crampy generalized abdominal pain of moderate to severe intensity.  No noted aggravating or alleviating factors.  The pain is constant and of waxing and waning intensity he has no nausea or vomiting .  He does report not having a bowel movement in 2 days with his not usual for him since his cholecystectomy 2 years ago but he is passing gas.  Denies fever or chills, denies cough or shortness of breath or chest pain.  Denies dysuria.  ED Course: On arrival, BP 137/86, pulse 105, temp 99.1, O2 sat 96% on room air.  Blood work: WBC 21,000 with lactic acid 1.5.  BMP unremarkable hepatic function panel significant for total bili of 1.4 and indirect bili of 1.2.  Lipase 88.  Urinalysis without pyuria, few ketones. Imaging: CT abdomen and pelvis with mild acute pancreatitis, no pseudocyst formation.  Aortic atherosclerosis including coronary artery calcifications.  Morphine in the emergency room but continued to have pain.  Hospitalist consulted for admission.  Review of Systems: As per HPI otherwise all other systems on review of systems negative.    Past Medical History:  Diagnosis Date  . Arthritis    BACK  . Basal cell carcinoma 05/19/2019   Right lower lip. Nodular and infiltrative.  Marland Kitchen Dysplastic nevus 07/14/2019   Left mid back. Moderate atypia, limited margins free.  Marland Kitchen Dysplastic nevus 07/14/2019   Right spinal mid back. Moderate atypia, deep margin involved.  Marland Kitchen Dysplastic nevus 07/14/2019   Right upper arm. Moderate atypia, deep margin  involved.   Marland Kitchen GERD (gastroesophageal reflux disease)   . Hypertension   . Melanoma ([place]) 06/29/2019   0.66m Level II,  spinal mid back [place], [place]  . Obesity   . Pre-diabetes   . Sleep apnea    cpap    Past Surgical History:  Procedure Laterality Date  . BACK SURGERY     x3  . CHOLECYSTECTOMY N/A 12/02/2017   Procedure: LAPAROSCOPIC CHOLECYSTECTOMY WITH INTRAOPERATIVE CHOLANGIOGRAM;  Surgeon: POlean Ree Patel;  Location: [facility] ORS;  Service: General;  Laterality: N/A;  . COLONOSCOPY N/A 05/24/2014   Procedure: COLONOSCOPY;  Surgeon: PHulen Luster Patel;  [facility];  Service: Gastroenterology;  Laterality: N/A;  . ELBOW SURGERY Right    tennis elbow  . ERCP N/A 11/18/2017   Procedure: ENDOSCOPIC RETROGRADE CHOLANGIOPANCREATOGRAPHY (ERCP);  Surgeon: WLucilla Lame Patel;  [facility];  Service: Endoscopy;  Laterality: N/A;  . ESOPHAGOGASTRODUODENOSCOPY N/A 05/24/2014   Procedure: ESOPHAGOGASTRODUODENOSCOPY (EGD);  Surgeon: PHulen Luster Patel;  [facility];  Service: Gastroenterology;  Laterality: N/A;  . PROSTATE SURGERY    . SPINE SURGERY     l-spine x 2     reports that he has never smoked. He has never used smokeless tobacco. He reports that he does not drink alcohol and does not use drugs.  Allergies  Allergen Reactions  . Percocet [Oxycodone-Acetaminophen] Itching  . Tape Rash    Dermabond skin glue and Tape    Family History  Problem Relation Age of Onset  .  Hypertension Mother       Prior to Admission medications   Medication Sig Start Date End Date Taking? Authorizing Provider  amLODipine (NORVASC) 10 MG tablet Take 10 mg by mouth every morning.    Yes Provider, Historical, Patel  aspirin EC 81 MG tablet Take 81 mg by mouth daily.   Yes Provider, Historical, Patel  Cholecalciferol (VITAMIN D-3) 125 MCG (5000 UT) TABS Take 5,000 Units by mouth 2 (two) times daily.   Yes Provider, Historical, Patel  Cyanocobalamin 1000 MCG/ML KIT Inject 1,000 mcg  into the muscle every 30 (thirty) days.  08/14/17  Yes Provider, Historical, Patel  fluticasone (FLONASE) 50 MCG/ACT nasal spray Place 2 sprays into both nostrils daily.   Yes Provider, Historical, Patel  hydrochlorothiazide (HYDRODIURIL) 12.5 MG tablet Take 12.5 mg by mouth daily. 02/01/20  Yes Provider, Historical, Patel  lansoprazole (PREVACID) 30 MG capsule Take 30 mg by mouth 2 (two) times daily before a meal.   Yes Provider, Historical, Patel  lisinopril (PRINIVIL,ZESTRIL) 40 MG tablet Take 1 tablet (40 mg total) by mouth daily. Patient taking differently: Take 40 mg by mouth every morning. 11/19/17  Yes Mayo, Pete Pelt, Patel  meloxicam (MOBIC) 15 MG tablet Take 15 mg by mouth daily.   Yes Provider, Historical, Patel  metFORMIN (GLUCOPHAGE-XR) 500 MG 24 hr tablet SMARTSIG:2 Tablet(s) By Mouth Every Evening 04/17/19  Yes Provider, Historical, Patel  methocarbamol (ROBAXIN) 500 MG tablet Take 500 mg by mouth 2 (two) times daily. 04/01/19  Yes Provider, Historical, Patel  metoprolol succinate (TOPROL-XL) 50 MG 24 hr tablet Take 50 mg by mouth every morning. Take with or immediately following a meal.   Yes Provider, Historical, Patel  Omega-3 Fatty Acids (FISH OIL) 1000 MG CAPS Take 2,000 mg by mouth 2 (two) times daily.   Yes Provider, Historical, Patel  OZEMPIC, 0.25 OR 0.5 MG/DOSE, 2 MG/1.5ML SOPN Inject 0.25 mg into the skin once a week. 01/02/20  Yes Provider, Historical, Patel  pravastatin (PRAVACHOL) 10 MG tablet Take 10 mg by mouth at bedtime. 01/22/20  Yes Provider, Historical, Patel  tadalafil (CIALIS) 5 MG tablet Take 5 mg by mouth daily. 03/29/20  Yes Provider, Historical, Patel  testosterone cypionate (DEPOTESTOSTERONE CYPIONATE) 200 MG/ML injection Inject 200 mg into the muscle every 14 (fourteen) days.  10/25/17  Yes Provider, Historical, Patel  Turmeric 500 MG CAPS Take 500 mg by mouth 2 (two) times daily.   Yes Provider, Historical, Patel    Physical Exam: Vitals:   04/06/20 2144 04/06/20 2146 04/07/20 0042  BP:  137/86 108/73   Pulse:  (!) 105 95  Resp:  20 20  Temp:  99.1 F (37.3 C) 99.2 F (37.3 C)  TempSrc:  Oral   SpO2:  96% 92%  Weight: (!) 147.9 kg    Height: 5' 10" (1.778 m)       Vitals:   04/06/20 2144 04/06/20 2146 04/07/20 0042  BP:  137/86 108/73  Pulse:  (!) 105 95  Resp:  20 20  Temp:  99.1 F (37.3 C) 99.2 F (37.3 C)  TempSrc:  Oral   SpO2:  96% 92%  Weight: (!) 147.9 kg    Height: 5' 10" (1.778 m)        Constitutional: Alert and oriented x 3 . Not in any apparent distress HEENT:      Head: Normocephalic and atraumatic.         Eyes: PERLA, EOMI, Conjunctivae are normal. Sclera is non-icteric.  Mouth/Throat: Mucous membranes are moist.       Neck: Supple with no signs of meningismus. Cardiovascular: Regular rate and rhythm. No murmurs, gallops, or rubs. 2+ symmetrical distal pulses are present . No JVD. No LE edema Respiratory: Respiratory effort normal .Lungs sounds clear bilaterally. No wheezes, crackles, or rhonchi.  Gastrointestinal: Soft, epigastric tenderness on deep palpation, and non distended with positive bowel sounds.  Genitourinary: No CVA tenderness. Musculoskeletal: Nontender with normal range of motion in all extremities. No cyanosis, or erythema of extremities. Neurologic:  Face is symmetric. Moving all extremities. No gross focal neurologic deficits . Skin: Skin is warm, dry.  No rash or ulcers Psychiatric: Mood and affect are normal    Labs on Admission: I have personally reviewed following labs and imaging studies  CBC: Recent Labs  Lab 04/06/20 2152  WBC 21.0*  NEUTROABS 17.7*  HGB 16.6  HCT 49.5  MCV 91.5  PLT 097   Basic Metabolic Panel: Recent Labs  Lab 04/06/20 2152  NA 133*  K 3.8  CL 98  CO2 25  GLUCOSE 174*  BUN 20  CREATININE 0.95  CALCIUM 9.3   GFR: Estimated Creatinine Clearance: 120.5 mL/min (by C-G formula based on SCr of 0.95 mg/dL). Liver Function Tests: Recent Labs  Lab 04/07/20 0030  AST 17  ALT 21   ALKPHOS 39  BILITOT 1.4*  PROT 7.1  ALBUMIN 4.1   Recent Labs  Lab 04/07/20 0030  LIPASE 88*   No results for input(s): AMMONIA in the last 168 hours. Coagulation Profile: No results for input(s): INR, PROTIME in the last 168 hours. Cardiac Enzymes: No results for input(s): CKTOTAL, CKMB, CKMBINDEX, TROPONINI in the last 168 hours. BNP (last 3 results) No results for input(s): PROBNP in the last 8760 hours. HbA1C: No results for input(s): HGBA1C in the last 72 hours. CBG: No results for input(s): GLUCAP in the last 168 hours. Lipid Profile: No results for input(s): CHOL, HDL, LDLCALC, TRIG, CHOLHDL, LDLDIRECT in the last 72 hours. Thyroid Function Tests: No results for input(s): TSH, T4TOTAL, FREET4, T3FREE, THYROIDAB in the last 72 hours. Anemia Panel: No results for input(s): VITAMINB12, FOLATE, FERRITIN, TIBC, IRON, RETICCTPCT in the last 72 hours. Urine analysis:    Component Value Date/Time   COLORURINE AMBER (A) 04/06/2020 2152   APPEARANCEUR HAZY (A) 04/06/2020 2152   LABSPEC 1.029 04/06/2020 2152   PHURINE 5.0 04/06/2020 2152   GLUCOSEU >=500 (A) 04/06/2020 2152   HGBUR NEGATIVE 04/06/2020 2152   BILIRUBINUR NEGATIVE 04/06/2020 2152   KETONESUR 5 (A) 04/06/2020 2152   PROTEINUR 100 (A) 04/06/2020 2152   NITRITE NEGATIVE 04/06/2020 2152   LEUKOCYTESUR NEGATIVE 04/06/2020 2152    Radiological Exams on Admission: CT ABDOMEN PELVIS W CONTRAST  Result Date: 04/07/2020 CLINICAL DATA:  Acute nonlocalized abdominal pain. states last night he began to have generalized abd pain, states he feels like he is constipated. EXAM: CT ABDOMEN AND PELVIS WITH CONTRAST TECHNIQUE: Multidetector CT imaging of the abdomen and pelvis was performed using the standard protocol following bolus administration of intravenous contrast. CONTRAST:  146m OMNIPAQUE IOHEXOL 300 MG/ML  SOLN COMPARISON:  MRI abdomen 11/16/2017 FINDINGS: Lower chest: Left lower lobe linear atelectasis versus  scarring. Hepatobiliary: No focal liver abnormality is seen. Status post cholecystectomy. No biliary dilatation. Pancreas: No focal lesion. Slightly haziness to the pancreatic contour. Trace free fluid inferior to the pancreas. No main pancreatic ductal dilatation. No pseudocyst formation. Spleen: Normal in size without focal abnormality. Adrenals/Urinary Tract: No adrenal nodule  bilaterally. Bilateral kidneys enhance symmetrically. Subcentimeter hypodensities are too small to characterize. No hydronephrosis. No hydroureter. The urinary bladder is unremarkable. Stomach/Bowel: Stomach is within normal limits. No evidence of bowel wall thickening or dilatation. The appendix is normal in caliber. Vascular/Lymphatic: No abdominal aorta or iliac aneurysm. Moderate atherosclerotic plaque of the aorta and its branches. No abdominal, pelvic, or inguinal lymphadenopathy. Reproductive: Prostate is unremarkable. Other: Trace free fluid extending from the pancreas into the pelvis. No intraperitoneal free gas. No organized fluid collection. Musculoskeletal: No abdominal wall hernia or abnormality. No suspicious lytic or blastic osseous lesions. No acute displaced fracture. Multilevel degenerative changes of the spine. Posterolateral fusion of the L2 through L4 levels. IMPRESSION: 1. Mild acute pancreatitis.  No pseudocyst formation. 2. Aortic Atherosclerosis (ICD10-I70.0) including coronary artery calcifications. Electronically Signed   By: Iven Finn M.D.   On: 04/07/2020 01:25     Assessment/Plan 60 year old male with history of DM, HTN, and gallstone pancreatitis status post cholecystectomy in 2019, presenting with abdominal pain .    Acute pancreatitis   History of gallstone pancreatitis 2019 s/p cholecystectomy -Severe abdominal pain, without nausea vomiting with Lipase 88 with mild bilirubin elevation -CT abdomen showing mild acute pancreatitis without pseudocyst formation -Etiology uncertain.  Can consider  GI consult -Clear liquid diet -IV hydration, IV antiemetics and supportive care -Pain control  Leukocytosis -Patient with leukocytosis of 22,000, tachycardia and low-grade temp of 99.1 but no obvious source of infection -Urinalysis sterile and CT abdomen and pelvis with no acute intra-abdominal or intrapelvic process and no acute process of the lung bases -Could be related to SIRS from acute pancreatitis -Continue to monitor for other signs or symptoms of infection -We will hold off on empiric antibiotics for now    HTN (hypertension) -BP controlled.  Continue home meds    OSA on CPAP -Home CPAP if desired    Obesity, Class III, BMI 40-49.9 (morbid obesity) (HCC) -Complicating factor to overall prognosis and care    Diabetes mellitus without complication (HCC) -Sliding scale insulin coverage    Aortic atherosclerosis (HCC) -CT abdomen and pelvis reveals aortic atherosclerosis including coronary artery calcifications. -Risk factor modification   DVT prophylaxis: Lovenox  Code Status: full code  Family Communication: Wife at bedside Disposition Plan: Back to previous home environment Consults called: none  Status: Observation    Athena Masse Patel [facility]     04/07/2020, 2:58 AM

## 2020-04-07 NOTE — Assessment & Plan Note (Addendum)
-   s/p history of gallstone pancreatitis with cholecystectomy approximately 2 to 3 years ago -Lipase 88 on admission with generalized abdominal pain and nausea; lipase has since normalized since admission -Patient recently discontinued Ozempic due to GI issues.  Etiology of patient's pancreatitis this hospitalization likely contributed to by recent use of Ozempic.  No other organic causes have been identified at this time to explain presentation; needs to stay off Ozempic -Symptoms improved.  Diet was advanced and tolerated well.  Patient was considered stable for discharging home

## 2020-04-07 NOTE — ED Notes (Signed)
Pt provided with ice chips

## 2020-04-07 NOTE — ED Notes (Addendum)
Dr. Duncan at bedside 

## 2020-04-07 NOTE — Plan of Care (Signed)
  Problem: Health Behavior/Discharge Planning: Goal: Ability to manage health-related needs will improve Outcome: Progressing   

## 2020-04-07 NOTE — Hospital Course (Addendum)
Mike Patel is a 60 yo male with PMH HTN, DMII, gallstone pancreatitis s/p CCY who presented to the ER with generalized abdominal pain and nausea associated with the pain but no vomiting.  He states he has not had any issues since his cholecystectomy approximately 2 to 3 years ago.  He did endorse some GI issues recently with Ozempic which he discontinued approximately 1 month ago. In the ER he was found to have a leukocytosis with mildly elevated total bili.  Lipase 88. CT abdomen/pelvis showed mild pancreatitis, no pseudocyst formation.  He was started on IV fluids admitted for further pain control and monitoring. He continued to slowly improve.  His diet was advanced slowly and tolerated well.  Lipase also normalized.  Etiology of his pancreatitis was considered due to recent use of Ozempic which was discontinued by the patient prior to admission.  He was recommended to remain off and follow-up outpatient for further management.

## 2020-04-07 NOTE — Assessment & Plan Note (Signed)
-  Continue ongoing lifestyle modifications as able.  Discussed with patient Body mass index is 46.78 kg/m.

## 2020-04-07 NOTE — Assessment & Plan Note (Signed)
-  Continue lisinopril and Toprol

## 2020-04-07 NOTE — ED Notes (Signed)
Pt c/o room being too warm, thermostat adjusted to cool room. Pt requesting fan, no fans available in department. Pt provided with cold washcloth and ice pack for comfort measures. Education provided that pt must remain NPO, unable to eat ice chips. Pt and pt's wife at bedside verbalized understanding. Pt and pt's wife deny further needs at this time.

## 2020-04-08 DIAGNOSIS — K5900 Constipation, unspecified: Secondary | ICD-10-CM

## 2020-04-08 LAB — COMPREHENSIVE METABOLIC PANEL
ALT: 15 U/L (ref 0–44)
AST: 13 U/L — ABNORMAL LOW (ref 15–41)
Albumin: 3.4 g/dL — ABNORMAL LOW (ref 3.5–5.0)
Alkaline Phosphatase: 38 U/L (ref 38–126)
Anion gap: 7 (ref 5–15)
BUN: 11 mg/dL (ref 6–20)
CO2: 26 mmol/L (ref 22–32)
Calcium: 8.5 mg/dL — ABNORMAL LOW (ref 8.9–10.3)
Chloride: 103 mmol/L (ref 98–111)
Creatinine, Ser: 0.67 mg/dL (ref 0.61–1.24)
GFR, Estimated: >60 mL/min (ref >60)
Glucose, Bld: 143 mg/dL — ABNORMAL HIGH (ref 70–99)
Potassium: 3.6 mmol/L (ref 3.5–5.1)
Sodium: 136 mmol/L (ref 135–145)
Total Bilirubin: 1.2 mg/dL (ref 0.3–1.2)
Total Protein: 6.3 g/dL — ABNORMAL LOW (ref 6.5–8.1)

## 2020-04-08 LAB — CBC WITH DIFFERENTIAL/PLATELET
Abs Immature Granulocytes: 0.12 10*3/uL — ABNORMAL HIGH (ref 0.00–0.07)
Basophils Absolute: 0 10*3/uL (ref 0.0–0.1)
Basophils Relative: 0 %
Eosinophils Absolute: 0 10*3/uL (ref 0.0–0.5)
Eosinophils Relative: 0 %
HCT: 44 % (ref 39.0–52.0)
Hemoglobin: 14.9 g/dL (ref 13.0–17.0)
Immature Granulocytes: 1 %
Lymphocytes Relative: 10 %
Lymphs Abs: 1.7 10*3/uL (ref 0.7–4.0)
MCH: 31 pg (ref 26.0–34.0)
MCHC: 33.9 g/dL (ref 30.0–36.0)
MCV: 91.5 fL (ref 80.0–100.0)
Monocytes Absolute: 1.7 10*3/uL — ABNORMAL HIGH (ref 0.1–1.0)
Monocytes Relative: 10 %
Neutro Abs: 13.4 10*3/uL — ABNORMAL HIGH (ref 1.7–7.7)
Neutrophils Relative %: 79 %
Platelets: 196 10*3/uL (ref 150–400)
RBC: 4.81 MIL/uL (ref 4.22–5.81)
RDW: 13.2 % (ref 11.5–15.5)
WBC: 17 10*3/uL — ABNORMAL HIGH (ref 4.0–10.5)
nRBC: 0 % (ref 0.0–0.2)

## 2020-04-08 LAB — GLUCOSE, CAPILLARY
Glucose-Capillary: 147 mg/dL — ABNORMAL HIGH (ref 70–99)
Glucose-Capillary: 152 mg/dL — ABNORMAL HIGH (ref 70–99)
Glucose-Capillary: 119 mg/dL — ABNORMAL HIGH (ref 70–99)
Glucose-Capillary: 134 mg/dL — ABNORMAL HIGH (ref 70–99)

## 2020-04-08 LAB — LIPASE, BLOOD: Lipase: 28 U/L (ref 11–51)

## 2020-04-08 LAB — MAGNESIUM: Magnesium: 1.9 mg/dL (ref 1.7–2.4)

## 2020-04-08 MED ORDER — MAGNESIUM CITRATE PO SOLN
1.0000 | Freq: Once | ORAL | Status: AC
  Administered 2020-04-08: 1 via ORAL
  Filled 2020-04-08: qty 296

## 2020-04-08 MED ORDER — SENNOSIDES-DOCUSATE SODIUM 8.6-50 MG PO TABS
1.0000 | ORAL_TABLET | Freq: Two times a day (BID) | ORAL | Status: DC
  Administered 2020-04-08 – 2020-04-09 (×3): 1 via ORAL
  Filled 2020-04-08 (×3): qty 1

## 2020-04-08 NOTE — Progress Notes (Signed)
PROGRESS NOTE    Mike Patel   NFA:[id number]  DOB: 1960-08-14  DOA: 04/07/2020     1  PCP: [place] Pink, MD  CC: abdominal pain  Hospital Course: Mike Patel is a 61 yo male with PMH HTN, DMII, gallstone pancreatitis s/p CCY who presented to the ER with generalized abdominal pain and nausea associated with the pain but no vomiting.  He states he has not had any issues since his cholecystectomy approximately 2 to 3 years ago.  He did endorse some GI issues recently with Ozempic which he discontinued approximately 1 month ago. In the ER he was found to have a leukocytosis with mildly elevated total bili.  Lipase 88. CT abdomen/pelvis showed mild pancreatitis, no pseudocyst formation.  He was started on IV fluids admitted for further pain control and monitoring.   Interval History:  No events overnight. Tolerated CLD well yesterday. Mild symptoms but overall seems to be improving. Denies N/V.  Still having some constipation but endorses flatus. Will trial more laxatives today.   ROS: Constitutional: negative for chills and fevers, Respiratory: negative for cough, Cardiovascular: negative for chest pain and Gastrointestinal: positive for abdominal pain  Assessment & Plan: * Acute pancreatitis - s/p history of gallstone pancreatitis with cholecystectomy approximately 2 to 3 years ago -Lipase 88 on admission with generalized abdominal pain and nausea; lipase has since normalized since admission -Patient recently discontinued Ozempic due to GI issues.  Etiology of patient's pancreatitis this hospitalization likely contributed to by recent use of Ozempic.  No other organic causes have been identified at this time to explain presentation; needs to stay off Ozempic -Symptoms are mild at this time and have been improving - continue IVF - advance diet from CLD to soft today and monitor response; if tolerates then probable d/c Saturday  Constipation - no ileus or obstruction noted on CT  abd/pelvis on admission - last BM > 3 days ago at this point; having flatus - continue miralax - add senna-S and mag citrate  SIRS (systemic inflammatory response syndrome) (HCC)-resolved as of 04/08/2020 - etiology 2/2 pancreatitis and acute illness; no concerns for infection at this time - continue to treat underlying etiology   Aortic atherosclerosis ([place]) -Seen on CT abdomen/pelvis -Continue ongoing risk factor modifications  Diabetes mellitus without complication ([place]) -Continue SSI and CBG monitoring  Obesity, Class III, BMI 40-49.9 (morbid obesity) ([place]) -Continue ongoing lifestyle modifications as able.  Discussed with patient Body mass index is 46.78 kg/m.  OSA on CPAP - uses CPAP at home  HTN (hypertension) -Continue lisinopril and Toprol   Old records reviewed in assessment of this patient  Antimicrobials: n/a  DVT prophylaxis: Lovenox   Code Status:   Code Status: Full Code Family Communication:   Disposition Plan: Status is: Inpatient  Remains inpatient appropriate because:IV treatments appropriate due to intensity of illness or inability to take PO and Inpatient level of care appropriate due to severity of illness   Dispo: The patient is from: Home              Anticipated d/c is to: Home              Patient currently is not medically stable to d/c.   Difficult to place patient No  Risk of unplanned readmission score: Unplanned Admission- Pilot do not use: 9.09   Objective: Blood pressure 138/74, pulse 79, temperature 98.1 F (36.7 C), resp. rate 18, height 5\' 10"  (1.778 m), weight (!) 147.9 kg, SpO2 96 %.  Examination: General appearance: alert, cooperative and no distress Head: Normocephalic, without obvious abnormality, atraumatic Eyes: EOMI Lungs: clear to auscultation bilaterally Heart: regular rate and rhythm and S1, S2 normal Abdomen: Obese, soft, nondistended.  Bowel sounds present throughout.  Minimal TTP, no R/G Extremities: No  edema Skin: mobility and turgor normal Neurologic: Grossly normal  Consultants:     Procedures:     Data Reviewed: I have personally reviewed following labs and imaging studies Results for orders placed or performed during the hospital encounter of 04/07/20 (from the past 24 hour(s))  CBG monitoring, ED     Status: Abnormal   Collection Time: 04/07/20  2:31 PM  Result Value Ref Range   Glucose-Capillary 200 (H) 70 - 99 mg/dL  Glucose, capillary     Status: Abnormal   Collection Time: 04/07/20  8:47 PM  Result Value Ref Range   Glucose-Capillary 157 (H) 70 - 99 mg/dL  CBC with Differential/Platelet     Status: Abnormal   Collection Time: 04/08/20  4:11 AM  Result Value Ref Range   WBC 17.0 (H) 4.0 - 10.5 K/uL   RBC 4.81 4.22 - 5.81 MIL/uL   Hemoglobin 14.9 13.0 - 17.0 g/dL   HCT 44.0 39.0 - 52.0 %   MCV 91.5 80.0 - 100.0 fL   MCH 31.0 26.0 - 34.0 pg   MCHC 33.9 30.0 - 36.0 g/dL   RDW 13.2 11.5 - 15.5 %   Platelets 196 150 - 400 K/uL   nRBC 0.0 0.0 - 0.2 %   Neutrophils Relative % 79 %   Neutro Abs 13.4 (H) 1.7 - 7.7 K/uL   Lymphocytes Relative 10 %   Lymphs Abs 1.7 0.7 - 4.0 K/uL   Monocytes Relative 10 %   Monocytes Absolute 1.7 (H) 0.1 - 1.0 K/uL   Eosinophils Relative 0 %   Eosinophils Absolute 0.0 0.0 - 0.5 K/uL   Basophils Relative 0 %   Basophils Absolute 0.0 0.0 - 0.1 K/uL   Immature Granulocytes 1 %   Abs Immature Granulocytes 0.12 (H) 0.00 - 0.07 K/uL  Comprehensive metabolic panel     Status: Abnormal   Collection Time: 04/08/20  4:11 AM  Result Value Ref Range   Sodium 136 135 - 145 mmol/L   Potassium 3.6 3.5 - 5.1 mmol/L   Chloride 103 98 - 111 mmol/L   CO2 26 22 - 32 mmol/L   Glucose, Bld 143 (H) 70 - 99 mg/dL   BUN 11 6 - 20 mg/dL   Creatinine, Ser 0.67 0.61 - 1.24 mg/dL   Calcium 8.5 (L) 8.9 - 10.3 mg/dL   Total Protein 6.3 (L) 6.5 - 8.1 g/dL   Albumin 3.4 (L) 3.5 - 5.0 g/dL   AST 13 (L) 15 - 41 U/L   ALT 15 0 - 44 U/L   Alkaline Phosphatase  38 38 - 126 U/L   Total Bilirubin 1.2 0.3 - 1.2 mg/dL   GFR, Estimated >60 >60 mL/min   Anion gap 7 5 - 15  Magnesium     Status: None   Collection Time: 04/08/20  4:11 AM  Result Value Ref Range   Magnesium 1.9 1.7 - 2.4 mg/dL  Lipase, blood     Status: None   Collection Time: 04/08/20  4:11 AM  Result Value Ref Range   Lipase 28 11 - 51 U/L  Glucose, capillary     Status: Abnormal   Collection Time: 04/08/20  7:44 AM  Result Value Ref Range  Glucose-Capillary 147 (H) 70 - 99 mg/dL    Recent Results (from the past 240 hour(s))  Resp Panel by RT-PCR (Flu A&B, Covid) Nasopharyngeal Swab     Status: None   Collection Time: 04/07/20  2:47 AM   Specimen: Nasopharyngeal Swab; Nasopharyngeal(NP) swabs in vial transport medium  Result Value Ref Range Status   SARS Coronavirus 2 by RT PCR NEGATIVE NEGATIVE Final    Comment: (NOTE) SARS-CoV-2 target nucleic acids are NOT DETECTED.  The SARS-CoV-2 RNA is generally detectable in upper respiratory specimens during the acute phase of infection. The lowest concentration of SARS-CoV-2 viral copies this assay can detect is 138 copies/mL. A negative result does not preclude SARS-Cov-2 infection and should not be used as the sole basis for treatment or other patient management decisions. A negative result may occur with  improper specimen collection/handling, submission of specimen other than nasopharyngeal swab, presence of viral mutation(s) within the areas targeted by this assay, and inadequate number of viral copies(<138 copies/mL). A negative result must be combined with clinical observations, patient history, and epidemiological information. The expected result is Negative.  Fact Sheet for Patients:  EntrepreneurPulse.com.au  Fact Sheet for Healthcare Providers:  IncredibleEmployment.be  This test is no t yet approved or cleared by the [place] FDA and  has been authorized for detection  and/or diagnosis of SARS-CoV-2 by FDA under an Emergency Use Authorization (EUA). This EUA will remain  in effect (meaning this test can be used) for the duration of the COVID-19 declaration under Section 564(b)(1) of the Act, 21 U.S.C.section 360bbb-3(b)(1), unless the authorization is terminated  or revoked sooner.       Influenza A by PCR NEGATIVE NEGATIVE Final   Influenza B by PCR NEGATIVE NEGATIVE Final    Comment: (NOTE) The Xpert Xpress SARS-CoV-2/FLU/RSV plus assay is intended as an aid in the diagnosis of influenza from Nasopharyngeal swab specimens and should not be used as a sole basis for treatment. Nasal washings and aspirates are unacceptable for Xpert Xpress SARS-CoV-2/FLU/RSV testing.  Fact Sheet for Patients: EntrepreneurPulse.com.au  Fact Sheet for Healthcare Providers: IncredibleEmployment.be  This test is not yet approved or cleared by the [place] FDA and has been authorized for detection and/or diagnosis of SARS-CoV-2 by FDA under an Emergency Use Authorization (EUA). This EUA will remain in effect (meaning this test can be used) for the duration of the COVID-19 declaration under Section 564(b)(1) of the Act, 21 U.S.C. section 360bbb-3(b)(1), unless the authorization is terminated or revoked.  Performed at [facility], [address], [place], [place] [address]      Radiology Studies: CT ABDOMEN PELVIS W CONTRAST  Result Date: 04/07/2020 CLINICAL DATA:  Acute nonlocalized abdominal pain. states last night he began to have generalized abd pain, states he feels like he is constipated. EXAM: CT ABDOMEN AND PELVIS WITH CONTRAST TECHNIQUE: Multidetector CT imaging of the abdomen and pelvis was performed using the standard protocol following bolus administration of intravenous contrast. CONTRAST:  117mL OMNIPAQUE IOHEXOL 300 MG/ML  SOLN COMPARISON:  MRI abdomen 11/16/2017 FINDINGS: Lower chest: Left lower lobe  linear atelectasis versus scarring. Hepatobiliary: No focal liver abnormality is seen. Status post cholecystectomy. No biliary dilatation. Pancreas: No focal lesion. Slightly haziness to the pancreatic contour. Trace free fluid inferior to the pancreas. No main pancreatic ductal dilatation. No pseudocyst formation. Spleen: Normal in size without focal abnormality. Adrenals/Urinary Tract: No adrenal nodule bilaterally. Bilateral kidneys enhance symmetrically. Subcentimeter hypodensities are too small to characterize. No hydronephrosis. No hydroureter.  The urinary bladder is unremarkable. Stomach/Bowel: Stomach is within normal limits. No evidence of bowel wall thickening or dilatation. The appendix is normal in caliber. Vascular/Lymphatic: No abdominal aorta or iliac aneurysm. Moderate atherosclerotic plaque of the aorta and its branches. No abdominal, pelvic, or inguinal lymphadenopathy. Reproductive: Prostate is unremarkable. Other: Trace free fluid extending from the pancreas into the pelvis. No intraperitoneal free gas. No organized fluid collection. Musculoskeletal: No abdominal wall hernia or abnormality. No suspicious lytic or blastic osseous lesions. No acute displaced fracture. Multilevel degenerative changes of the spine. Posterolateral fusion of the L2 through L4 levels. IMPRESSION: 1. Mild acute pancreatitis.  No pseudocyst formation. 2. Aortic Atherosclerosis (ICD10-I70.0) including coronary artery calcifications. Electronically Signed   By: Iven Finn M.D.   On: 04/07/2020 01:25   CT ABDOMEN PELVIS W CONTRAST  Final Result      Scheduled Meds:  amLODipine  10 mg Oral BH-q7a   enoxaparin (LOVENOX) injection  0.5 mg/kg Subcutaneous Q24H   insulin aspart  0-15 Units Subcutaneous TID WC   insulin aspart  0-5 Units Subcutaneous QHS   lisinopril  40 mg Oral Daily   metoprolol succinate  50 mg Oral BH-q7a   pantoprazole  40 mg Oral Daily   polyethylene glycol  17 g Oral Daily    senna-docusate  1 tablet Oral BID   PRN Meds: acetaminophen **OR** acetaminophen, morphine injection, ondansetron **OR** ondansetron (ZOFRAN) IV Continuous Infusions:  sodium chloride 100 mL/hr at 04/08/20 1036     LOS: 1 day  Time spent: Greater than 50% of the 35 minute visit was spent in counseling/coordination of care for the patient as laid out in the A&P.   Dwyane Dee, MD [facility] 04/08/2020, 11:20 AM

## 2020-04-08 NOTE — Assessment & Plan Note (Signed)
-   no ileus or obstruction noted on CT abd/pelvis on admission - last BM > 3 days ago at this point; having flatus - continue miralax - add senna-S and mag citrate

## 2020-04-09 ENCOUNTER — Encounter: Payer: Self-pay | Admitting: Internal Medicine

## 2020-04-09 LAB — CBC WITH DIFFERENTIAL/PLATELET
Abs Immature Granulocytes: 0.08 10*3/uL — ABNORMAL HIGH (ref 0.00–0.07)
Basophils Absolute: 0.1 10*3/uL (ref 0.0–0.1)
Basophils Relative: 1 %
Eosinophils Absolute: 0.2 10*3/uL (ref 0.0–0.5)
Eosinophils Relative: 1 %
HCT: 47.6 % (ref 39.0–52.0)
Hemoglobin: 15.5 g/dL (ref 13.0–17.0)
Immature Granulocytes: 1 %
Lymphocytes Relative: 16 %
Lymphs Abs: 2.2 10*3/uL (ref 0.7–4.0)
MCH: 30.5 pg (ref 26.0–34.0)
MCHC: 32.6 g/dL (ref 30.0–36.0)
MCV: 93.7 fL (ref 80.0–100.0)
Monocytes Absolute: 1.2 10*3/uL — ABNORMAL HIGH (ref 0.1–1.0)
Monocytes Relative: 9 %
Neutro Abs: 10 10*3/uL — ABNORMAL HIGH (ref 1.7–7.7)
Neutrophils Relative %: 72 %
Platelets: 219 10*3/uL (ref 150–400)
RBC: 5.08 MIL/uL (ref 4.22–5.81)
RDW: 13.1 % (ref 11.5–15.5)
WBC: 13.6 10*3/uL — ABNORMAL HIGH (ref 4.0–10.5)
nRBC: 0 % (ref 0.0–0.2)

## 2020-04-09 LAB — COMPREHENSIVE METABOLIC PANEL
ALT: 15 U/L (ref 0–44)
AST: 15 U/L (ref 15–41)
Albumin: 3.6 g/dL (ref 3.5–5.0)
Alkaline Phosphatase: 37 U/L — ABNORMAL LOW (ref 38–126)
Anion gap: 5 (ref 5–15)
BUN: 10 mg/dL (ref 6–20)
CO2: 30 mmol/L (ref 22–32)
Calcium: 8.4 mg/dL — ABNORMAL LOW (ref 8.9–10.3)
Chloride: 102 mmol/L (ref 98–111)
Creatinine, Ser: 0.74 mg/dL (ref 0.61–1.24)
GFR, Estimated: >60 mL/min (ref >60)
Glucose, Bld: 166 mg/dL — ABNORMAL HIGH (ref 70–99)
Potassium: 3.8 mmol/L (ref 3.5–5.1)
Sodium: 137 mmol/L (ref 135–145)
Total Bilirubin: 0.9 mg/dL (ref 0.3–1.2)
Total Protein: 6.9 g/dL (ref 6.5–8.1)

## 2020-04-09 LAB — MAGNESIUM: Magnesium: 2.5 mg/dL — ABNORMAL HIGH (ref 1.7–2.4)

## 2020-04-09 LAB — GLUCOSE, CAPILLARY: Glucose-Capillary: 135 mg/dL — ABNORMAL HIGH (ref 70–99)

## 2020-04-09 MED ORDER — MAGNESIUM CITRATE PO SOLN
1.0000 | Freq: Once | ORAL | Status: AC
  Administered 2020-04-09: 12:00:00 1 via ORAL
  Filled 2020-04-09: qty 296

## 2020-04-09 NOTE — Discharge Summary (Signed)
Physician Discharge Summary   TEGH FRANEK UMP:[id number] DOB: 1960/10/14 DOA: 04/07/2020  PCP: Maryland Pink, MD  Admit date: 04/07/2020 Discharge date: 04/09/2020   Admitted From: home Disposition:  home Discharging physician: Dwyane Dee, MD  Recommendations for Outpatient Follow-up:  1. Remain off ozempic   Patient discharged to home in Discharge Condition: stable Risk of unplanned readmission score: Unplanned Admission- Pilot do not use: 9.34  CODE STATUS: Full Diet recommendation:  Diet Orders (From admission, onward)    Start     Ordered   04/09/20 0000  Diet - low sodium heart healthy        04/09/20 0957   04/08/20 0957  DIET SOFT Room service appropriate? Yes; Fluid consistency: Thin  Diet effective now       Question Answer Comment  Room service appropriate? Yes   Fluid consistency: Thin      04/08/20 0956          Hospital Course: Mr. Sizemore is a 60 yo male with PMH HTN, DMII, gallstone pancreatitis s/p CCY who presented to the ER with generalized abdominal pain and nausea associated with the pain but no vomiting.  He states he has not had any issues since his cholecystectomy approximately 2 to 3 years ago.  He did endorse some GI issues recently with Ozempic which he discontinued approximately 1 month ago. In the ER he was found to have a leukocytosis with mildly elevated total bili.  Lipase 88. CT abdomen/pelvis showed mild pancreatitis, no pseudocyst formation.  He was started on IV fluids admitted for further pain control and monitoring. He continued to slowly improve.  His diet was advanced slowly and tolerated well.  Lipase also normalized.  Etiology of his pancreatitis was considered due to recent use of Ozempic which was discontinued by the patient prior to admission.  He was recommended to remain off and follow-up outpatient for further management.   * Acute pancreatitis - s/p history of gallstone pancreatitis with cholecystectomy approximately 2 to 3  years ago -Lipase 88 on admission with generalized abdominal pain and nausea; lipase has since normalized since admission -Patient recently discontinued Ozempic due to GI issues.  Etiology of patient's pancreatitis this hospitalization likely contributed to by recent use of Ozempic.  No other organic causes have been identified at this time to explain presentation; needs to stay off Ozempic -Symptoms improved.  Diet was advanced and tolerated well.  Patient was considered stable for discharging home  Constipation - no ileus or obstruction noted on CT abd/pelvis on admission - last BM > 3 days ago at this point; having flatus - continue miralax - add senna-S and mag citrate  SIRS (systemic inflammatory response syndrome) (HCC)-resolved as of 04/08/2020 - etiology 2/2 pancreatitis and acute illness; no concerns for infection at this time - continue to treat underlying etiology   Aortic atherosclerosis ([place]) -Seen on CT abdomen/pelvis -Continue ongoing risk factor modifications  Diabetes mellitus without complication ([place]) -Continue SSI and CBG monitoring  Obesity, Class III, BMI 40-49.9 (morbid obesity) ([place]) -Continue ongoing lifestyle modifications as able.  Discussed with patient Body mass index is 46.78 kg/m.  OSA on CPAP - uses CPAP at home  HTN (hypertension) -Continue lisinopril and Toprol    The patient's chronic medical conditions were treated accordingly per the patient's home medication regimen except as noted.  On day of discharge, patient was felt deemed stable for discharge. Patient/family member advised to call PCP or come back to ER if needed.   Principal  Diagnosis: Acute pancreatitis  Discharge Diagnoses: Active Hospital Problems   Diagnosis Date Noted  . Acute pancreatitis 11/15/2017    Priority: High  . Constipation 04/08/2020    Priority: Medium  . Obesity, Class III, BMI 40-49.9 (morbid obesity) ([place]) 04/07/2020  . Diabetes mellitus without  complication ([place]) 00/76/2263  . Aortic atherosclerosis ([place]) 04/07/2020  . OSA on CPAP 09/01/2013  . HTN (hypertension) 01/28/2012    Resolved Hospital Problems   Diagnosis Date Noted Date Resolved  . SIRS (systemic inflammatory response syndrome) ([place]) 04/07/2020 04/08/2020    Priority: Low    Discharge Instructions    Diet - low sodium heart healthy   Complete by: As directed    Increase activity slowly   Complete by: As directed      Allergies as of 04/09/2020      Reactions   Percocet [oxycodone-acetaminophen] Itching   Tape Rash   Dermabond skin glue and Tape      Medication List    STOP taking these medications   Ozempic (0.25 or 0.5 MG/DOSE) 2 MG/1.5ML Sopn Generic drug: Semaglutide(0.25 or 0.5MG/DOS)     TAKE these medications   amLODipine 10 MG tablet Commonly known as: NORVASC Take 10 mg by mouth every morning.   aspirin EC 81 MG tablet Take 81 mg by mouth daily.   Cyanocobalamin 1000 MCG/ML Kit Inject 1,000 mcg into the muscle every 30 (thirty) days.   Fish Oil 1000 MG Caps Take 2,000 mg by mouth 2 (two) times daily.   fluticasone 50 MCG/ACT nasal spray Commonly known as: FLONASE Place 2 sprays into both nostrils daily.   hydrochlorothiazide 12.5 MG tablet Commonly known as: HYDRODIURIL Take 12.5 mg by mouth daily.   lansoprazole 30 MG capsule Commonly known as: PREVACID Take 30 mg by mouth 2 (two) times daily before a meal.   lisinopril 40 MG tablet Commonly known as: ZESTRIL Take 1 tablet (40 mg total) by mouth daily. What changed: when to take this   meloxicam 15 MG tablet Commonly known as: MOBIC Take 15 mg by mouth daily.   metFORMIN 500 MG 24 hr tablet Commonly known as: GLUCOPHAGE-XR SMARTSIG:2 Tablet(s) By Mouth Every Evening   methocarbamol 500 MG tablet Commonly known as: ROBAXIN Take 500 mg by mouth 2 (two) times daily.   metoprolol succinate 50 MG 24 hr tablet Commonly known as: TOPROL-XL Take 50 mg by mouth every  morning. Take with or immediately following a meal.   pravastatin 10 MG tablet Commonly known as: PRAVACHOL Take 10 mg by mouth at bedtime.   tadalafil 5 MG tablet Commonly known as: CIALIS Take 5 mg by mouth daily.   testosterone cypionate 200 MG/ML injection Commonly known as: DEPOTESTOSTERONE CYPIONATE Inject 200 mg into the muscle every 14 (fourteen) days.   Turmeric 500 MG Caps Take 500 mg by mouth 2 (two) times daily.   Vitamin D-3 125 MCG (5000 UT) Tabs Take 5,000 Units by mouth 2 (two) times daily.       Allergies  Allergen Reactions  . Percocet [Oxycodone-Acetaminophen] Itching  . Tape Rash    Dermabond skin glue and Tape    Consultations: n/a  Discharge Exam: BP (!) 149/78 (BP Location: Right Arm)   Pulse 77   Temp 98.7 F (37.1 C) (Oral)   Resp 18   Ht _0  (1.778 m)   Wt (!) 147.9 kg   SpO2 92%   BMI 46.78 kg/m  General appearance: alert, cooperative and no distress Head: Normocephalic, without  obvious abnormality, atraumatic Eyes: EOMI Lungs: clear to auscultation bilaterally Heart: regular rate and rhythm and S1, S2 normal Abdomen: Obese, soft, nondistended.  Bowel sounds present throughout.  Minimal TTP, no R/G Extremities: No edema Skin: mobility and turgor normal Neurologic: Grossly normal  The results of significant diagnostics from this hospitalization (including imaging, microbiology, ancillary and laboratory) are listed below for reference.   Microbiology: Recent Results (from the past 240 hour(s))  Resp Panel by RT-PCR (Flu A&B, Covid) Nasopharyngeal Swab     Status: None   Collection Time: 04/07/20  2:47 AM   Specimen: Nasopharyngeal Swab; Nasopharyngeal(NP) swabs in vial transport medium  Result Value Ref Range Status   SARS Coronavirus 2 by RT PCR NEGATIVE NEGATIVE Final    Comment: (NOTE) SARS-CoV-2 target nucleic acids are NOT DETECTED.  The SARS-CoV-2 RNA is generally detectable in upper respiratory specimens during the  acute phase of infection. The lowest concentration of SARS-CoV-2 viral copies this assay can detect is 138 copies/mL. A negative result does not preclude SARS-Cov-2 infection and should not be used as the sole basis for treatment or other patient management decisions. A negative result may occur with  improper specimen collection/handling, submission of specimen other than nasopharyngeal swab, presence of viral mutation(s) within the areas targeted by this assay, and inadequate number of viral copies(<138 copies/mL). A negative result must be combined with clinical observations, patient history, and epidemiological information. The expected result is Negative.  Fact Sheet for Patients:  EntrepreneurPulse.com.au  Fact Sheet for Healthcare Providers:  IncredibleEmployment.be  This test is no t yet approved or cleared by the [place] FDA and  has been authorized for detection and/or diagnosis of SARS-CoV-2 by FDA under an Emergency Use Authorization (EUA). This EUA will remain  in effect (meaning this test can be used) for the duration of the COVID-19 declaration under Section 564(b)(1) of the Act, 21 U.S.C.section 360bbb-3(b)(1), unless the authorization is terminated  or revoked sooner.       Influenza A by PCR NEGATIVE NEGATIVE Final   Influenza B by PCR NEGATIVE NEGATIVE Final    Comment: (NOTE) The Xpert Xpress SARS-CoV-2/FLU/RSV plus assay is intended as an aid in the diagnosis of influenza from Nasopharyngeal swab specimens and should not be used as a sole basis for treatment. Nasal washings and aspirates are unacceptable for Xpert Xpress SARS-CoV-2/FLU/RSV testing.  Fact Sheet for Patients: EntrepreneurPulse.com.au  Fact Sheet for Healthcare Providers: IncredibleEmployment.be  This test is not yet approved or cleared by the [place] FDA and has been authorized for detection and/or  diagnosis of SARS-CoV-2 by FDA under an Emergency Use Authorization (EUA). This EUA will remain in effect (meaning this test can be used) for the duration of the COVID-19 declaration under Section 564(b)(1) of the Act, 21 U.S.C. section 360bbb-3(b)(1), unless the authorization is terminated or revoked.  Performed at [facility], [address] [place], [place] [address]      Labs: BNP (last 3 results) No results for input(s): BNP in the last 8760 hours. Basic Metabolic Panel: Recent Labs  Lab 04/06/20 2152 04/07/20 0323 04/08/20 0411 04/09/20 0412  NA 133*  --  136 137  K 3.8  --  3.6 3.8  CL 98  --  103 102  CO2 25  --  26 30  GLUCOSE 174*  --  143* 166*  BUN 20  --  11 10  CREATININE 0.95 0.91 0.67 0.74  CALCIUM 9.3  --  8.5* 8.4*  MG  --   --  1.9 2.5*   Liver Function Tests: Recent Labs  Lab 04/07/20 0030 04/08/20 0411 04/09/20 0412  AST 17 13* 15  ALT _0 ALKPHOS 39 38 37*  BILITOT 1.4* 1.2 0.9  PROT 7.1 6.3* 6.9  ALBUMIN 4.1 3.4* 3.6   Recent Labs  Lab 04/07/20 0030 04/08/20 0411  LIPASE 88* 28   No results for input(s): AMMONIA in the last 168 hours. CBC: Recent Labs  Lab 04/06/20 2152 04/07/20 0323 04/08/20 0411 04/09/20 0412  WBC 21.0* 18.2* 17.0* 13.6*  NEUTROABS 17.7*  --  13.4* 10.0*  HGB 16.6 15.5 14.9 15.5  HCT 49.5 44.9 44.0 47.6  MCV 91.5 90.2 91.5 93.7  PLT 245 215 196 219   Cardiac Enzymes: No results for input(s): CKTOTAL, CKMB, CKMBINDEX, TROPONINI in the last 168 hours. BNP: Invalid input(s): POCBNP CBG: Recent Labs  Lab 04/08/20 0744 04/08/20 1201 04/08/20 1655 04/08/20 2022 04/09/20 0827  GLUCAP 147* 152* 119* 134* 135*   D-Dimer No results for input(s): DDIMER in the last 72 hours. Hgb A1c Recent Labs    04/07/20 0323  HGBA1C 6.8*   Lipid Profile No results for input(s): CHOL, HDL, LDLCALC, TRIG, CHOLHDL, LDLDIRECT in the last 72 hours. Thyroid function studies No results for input(s):  TSH, T4TOTAL, T3FREE, THYROIDAB in the last 72 hours.  Invalid input(s): FREET3 Anemia work up No results for input(s): VITAMINB12, FOLATE, FERRITIN, TIBC, IRON, RETICCTPCT in the last 72 hours. Urinalysis    Component Value Date/Time   COLORURINE AMBER (A) 04/06/2020 2152   APPEARANCEUR HAZY (A) 04/06/2020 2152   LABSPEC 1.029 04/06/2020 2152   PHURINE 5.0 04/06/2020 2152   GLUCOSEU >=500 (A) 04/06/2020 2152   HGBUR NEGATIVE 04/06/2020 2152   BILIRUBINUR NEGATIVE 04/06/2020 2152   KETONESUR 5 (A) 04/06/2020 2152   PROTEINUR 100 (A) 04/06/2020 2152   NITRITE NEGATIVE 04/06/2020 2152   LEUKOCYTESUR NEGATIVE 04/06/2020 2152   Sepsis Labs Invalid input(s): PROCALCITONIN,  WBC,  LACTICIDVEN Microbiology Recent Results (from the past 240 hour(s))  Resp Panel by RT-PCR (Flu A&B, Covid) Nasopharyngeal Swab     Status: None   Collection Time: 04/07/20  2:47 AM   Specimen: Nasopharyngeal Swab; Nasopharyngeal(NP) swabs in vial transport medium  Result Value Ref Range Status   SARS Coronavirus 2 by RT PCR NEGATIVE NEGATIVE Final    Comment: (NOTE) SARS-CoV-2 target nucleic acids are NOT DETECTED.  The SARS-CoV-2 RNA is generally detectable in upper respiratory specimens during the acute phase of infection. The lowest concentration of SARS-CoV-2 viral copies this assay can detect is 138 copies/mL. A negative result does not preclude SARS-Cov-2 infection and should not be used as the sole basis for treatment or other patient management decisions. A negative result may occur with  improper specimen collection/handling, submission of specimen other than nasopharyngeal swab, presence of viral mutation(s) within the areas targeted by this assay, and inadequate number of viral copies(<138 copies/mL). A negative result must be combined with clinical observations, patient history, and epidemiological information. The expected result is Negative.  Fact Sheet for Patients:   EntrepreneurPulse.com.au  Fact Sheet for Healthcare Providers:  IncredibleEmployment.be  This test is no t yet approved or cleared by the [place] FDA and  has been authorized for detection and/or diagnosis of SARS-CoV-2 by FDA under an Emergency Use Authorization (EUA). This EUA will remain  in effect (meaning this test can be used) for the duration of the COVID-19 declaration under Section 564(b)(1) of the Act, 21 U.S.C.section 360bbb-3(b)(1), unless  the authorization is terminated  or revoked sooner.       Influenza A by PCR NEGATIVE NEGATIVE Final   Influenza B by PCR NEGATIVE NEGATIVE Final    Comment: (NOTE) The Xpert Xpress SARS-CoV-2/FLU/RSV plus assay is intended as an aid in the diagnosis of influenza from Nasopharyngeal swab specimens and should not be used as a sole basis for treatment. Nasal washings and aspirates are unacceptable for Xpert Xpress SARS-CoV-2/FLU/RSV testing.  Fact Sheet for Patients: EntrepreneurPulse.com.au  Fact Sheet for Healthcare Providers: IncredibleEmployment.be  This test is not yet approved or cleared by the [place] FDA and has been authorized for detection and/or diagnosis of SARS-CoV-2 by FDA under an Emergency Use Authorization (EUA). This EUA will remain in effect (meaning this test can be used) for the duration of the COVID-19 declaration under Section 564(b)(1) of the Act, 21 U.S.C. section 360bbb-3(b)(1), unless the authorization is terminated or revoked.  Performed at [facility], [address], [place], [place] [address]     Procedures/Studies: CT ABDOMEN PELVIS W CONTRAST  Result Date: 04/07/2020 CLINICAL DATA:  Acute nonlocalized abdominal pain. states last night he began to have generalized abd pain, states he feels like he is constipated. EXAM: CT ABDOMEN AND PELVIS WITH CONTRAST TECHNIQUE: Multidetector CT imaging of the  abdomen and pelvis was performed using the standard protocol following bolus administration of intravenous contrast. CONTRAST:  163m OMNIPAQUE IOHEXOL 300 MG/ML  SOLN COMPARISON:  MRI abdomen 11/16/2017 FINDINGS: Lower chest: Left lower lobe linear atelectasis versus scarring. Hepatobiliary: No focal liver abnormality is seen. Status post cholecystectomy. No biliary dilatation. Pancreas: No focal lesion. Slightly haziness to the pancreatic contour. Trace free fluid inferior to the pancreas. No main pancreatic ductal dilatation. No pseudocyst formation. Spleen: Normal in size without focal abnormality. Adrenals/Urinary Tract: No adrenal nodule bilaterally. Bilateral kidneys enhance symmetrically. Subcentimeter hypodensities are too small to characterize. No hydronephrosis. No hydroureter. The urinary bladder is unremarkable. Stomach/Bowel: Stomach is within normal limits. No evidence of bowel wall thickening or dilatation. The appendix is normal in caliber. Vascular/Lymphatic: No abdominal aorta or iliac aneurysm. Moderate atherosclerotic plaque of the aorta and its branches. No abdominal, pelvic, or inguinal lymphadenopathy. Reproductive: Prostate is unremarkable. Other: Trace free fluid extending from the pancreas into the pelvis. No intraperitoneal free gas. No organized fluid collection. Musculoskeletal: No abdominal wall hernia or abnormality. No suspicious lytic or blastic osseous lesions. No acute displaced fracture. Multilevel degenerative changes of the spine. Posterolateral fusion of the L2 through L4 levels. IMPRESSION: 1. Mild acute pancreatitis.  No pseudocyst formation. 2. Aortic Atherosclerosis (ICD10-I70.0) including coronary artery calcifications. Electronically Signed   By: MIven FinnM.D.   On: 04/07/2020 01:25     Time coordinating discharge: Over 30 minutes    DDwyane Dee MD  [facility] 04/09/2020, 2:23 PM

## 2020-04-26 ENCOUNTER — Encounter: Payer: Self-pay | Admitting: Dermatology

## 2020-04-26 ENCOUNTER — Ambulatory Visit (INDEPENDENT_AMBULATORY_CARE_PROVIDER_SITE_OTHER): Payer: BC Managed Care – PPO | Admitting: Dermatology

## 2020-04-26 ENCOUNTER — Other Ambulatory Visit: Payer: Self-pay

## 2020-04-26 DIAGNOSIS — L820 Inflamed seborrheic keratosis: Secondary | ICD-10-CM | POA: Diagnosis not present

## 2020-04-26 DIAGNOSIS — L578 Other skin changes due to chronic exposure to nonionizing radiation: Secondary | ICD-10-CM

## 2020-04-26 DIAGNOSIS — Z1283 Encounter for screening for malignant neoplasm of skin: Secondary | ICD-10-CM

## 2020-04-26 DIAGNOSIS — Z85820 Personal history of malignant melanoma of skin: Secondary | ICD-10-CM

## 2020-04-26 DIAGNOSIS — L570 Actinic keratosis: Secondary | ICD-10-CM | POA: Diagnosis not present

## 2020-04-26 DIAGNOSIS — Z85828 Personal history of other malignant neoplasm of skin: Secondary | ICD-10-CM

## 2020-04-26 DIAGNOSIS — Z86018 Personal history of other benign neoplasm: Secondary | ICD-10-CM

## 2020-04-26 DIAGNOSIS — L821 Other seborrheic keratosis: Secondary | ICD-10-CM

## 2020-04-26 DIAGNOSIS — L739 Follicular disorder, unspecified: Secondary | ICD-10-CM

## 2020-04-26 DIAGNOSIS — D229 Melanocytic nevi, unspecified: Secondary | ICD-10-CM

## 2020-04-26 DIAGNOSIS — L720 Epidermal cyst: Secondary | ICD-10-CM

## 2020-04-26 DIAGNOSIS — L814 Other melanin hyperpigmentation: Secondary | ICD-10-CM

## 2020-04-26 DIAGNOSIS — D2371 Other benign neoplasm of skin of right lower limb, including hip: Secondary | ICD-10-CM

## 2020-04-26 NOTE — Progress Notes (Signed)
Follow-Up Visit   Subjective  Mike Patel is a 60 y.o. male who presents for the following: Follow-up (Patient here for 6 month follow-up TBSE. He has a history of melanoma of the spinal mid back, BCC of the right lower lip, and dysplastic nevi. He has a couple of scaly spots on his forehead and temple that are not improving with 2.5% hydrocortisone cream. ). He has growths on both nipples that get rubbed and irritated by clothing.   The following portions of the chart were reviewed this encounter and updated as appropriate:       Review of Systems:  No other skin or systemic complaints except as noted in HPI or Assessment and Plan.  Objective  Well appearing patient in no apparent distress; mood and affect are within normal limits.  A full examination was performed including scalp, head, eyes, ears, nose, lips, neck, chest, axillae, abdomen, back, buttocks, bilateral upper extremities, bilateral lower extremities, hands, feet, fingers, toes, fingernails, and toenails. All findings within normal limits unless otherwise noted below.  Objective  Spinal Mid Back: Well healed scar with no evidence of recurrence  Objective  L mid back, R spinal mid back, R upper arm: Scar with no evidence of recurrence.   Objective  Right Lower Lip: Well healed scar with no evidence of recurrence.   Objective  R temple x 2, L temple x 1 (3): Erythematous thin papules/macules with gritty scale.   Objective  Occipital Hairline: Crusted eroded papule  Objective  R med upper pretibia: 6.0 mm firm subcutaneous nodule.   Objective  Back: Regular brown macules.  Left Spinal Upper Back: 4.0 mm brown macule, lighter central  Images        Objective  L nipple x 2, R nipple x 2: Waxy flesh pedunculated papules.   Assessment & Plan   Skin cancer screening performed today.  Actinic Damage - chronic, secondary to cumulative UV radiation exposure/sun exposure over time - diffuse  scaly erythematous macules with underlying dyspigmentation - Recommend daily broad spectrum sunscreen SPF 30+ to sun-exposed areas, reapply every 2 hours as needed.  - Recommend staying in the shade or wearing long sleeves, sun glasses (UVA+UVB protection) and wide brim hats (4-inch brim around the entire circumference of the hat). - Call for new or changing lesions.  Lentigines - Scattered tan macules - Due to sun exposure - Benign-appering, observe - Recommend daily broad spectrum sunscreen SPF 30+ to sun-exposed areas, reapply every 2 hours as needed. - Call for any changes  Melanocytic Nevi - Tan-brown and/or pink-flesh-colored symmetric macules and papules - Benign appearing on exam today - Observation - Call clinic for new or changing moles - Recommend daily use of broad spectrum spf 30+ sunscreen to sun-exposed areas.   Seborrheic Keratoses - Stuck-on, waxy, tan-brown papules and/or plaques  - Benign-appearing - Discussed benign etiology and prognosis. - Observe - Call for any changes  Dermatofibroma - Firm pink/brown papulenodule with dimple sign of the right pretibia - Benign appearing - Call for any changes  Acrochordons (Skin Tags) - Fleshy, skin-colored pedunculated papules, bilateral axilla, abdomen, left and right nipple - Benign appearing.  - Observe. - If desired, they can be removed with an in office procedure that is not covered by insurance. - Please call the clinic if you notice any new or changing lesions.   History of melanoma Spinal Mid Back  Clark's Level II, Breslow's 0.51mm. WLE 06/29/2019, CASTLE 1A  Clear. Observe for recurrence. Call clinic  for new or changing lesions.  Recommend regular skin exams, daily broad-spectrum spf 30+ sunscreen use, and photoprotection.    T[employer] Window Application Form filled out today by Dr Nicole Kindred. Patient is a Administrator.  History of dysplastic nevus L mid back, R spinal mid back, R upper arm  Clear. Observe  for recurrence. Call clinic for new or changing lesions.  Recommend regular skin exams, daily broad-spectrum spf 30+ sunscreen use, and photoprotection.     History of basal cell carcinoma (BCC) Right Lower Lip  Clear. Observe for recurrence. Call clinic for new or changing lesions.  Recommend regular skin exams, daily broad-spectrum spf 30+ sunscreen use, and photoprotection.     AK (actinic keratosis) (3) R temple x 2, L temple x 1  vs ISK  Prior to procedure, discussed risks of blister formation, small wound, skin dyspigmentation, or rare scar following cryotherapy.    Destruction of lesion - R temple x 2, L temple x 1  Destruction method: cryotherapy   Informed consent: discussed and consent obtained   Lesion destroyed using liquid nitrogen: Yes   Region frozen until ice ball extended beyond lesion: Yes   Outcome: patient tolerated procedure well with no complications   Post-procedure details: wound care instructions given    Folliculitis Occipital Hairline  Benign, observe  Bump recently came up and is now healing  Epidermal inclusion cyst R med upper pretibia  Benign-appearing. Exam most consistent with an epidermal inclusion cyst. Discussed that a cyst is a benign growth that can grow over time and sometimes get irritated or inflamed. Recommend observation if it is not bothersome. Discussed option of surgical excision to remove it if it is growing, symptomatic, or other changes noted. Please call for new or changing lesions so they can be evaluated.     Nevus (2) Left Spinal Upper Back; Back  Benign-appearing.  Observation.  Call clinic for new or changing moles.  Recommend daily use of broad spectrum spf 30+ sunscreen to sun-exposed areas.    Inflamed seborrheic keratosis L nipple x 2, R nipple x 2  Prior to procedure, discussed risks of blister formation, small wound, skin dyspigmentation, or rare scar following cryotherapy.    Destruction of lesion - L  nipple x 2, R nipple x 2  Destruction method: cryotherapy   Informed consent: discussed and consent obtained   Lesion destroyed using liquid nitrogen: Yes   Region frozen until ice ball extended beyond lesion: Yes   Outcome: patient tolerated procedure well with no complications   Post-procedure details: wound care instructions given    Return in about 6 months (around 10/26/2020) for TBSE. Hx melanoma.Lindi Adie, CMA, am acting as scribe for [name], MD .  Documentation: I have reviewed the above documentation for accuracy and completeness, and I agree with the above.  [name] MD

## 2020-04-26 NOTE — Patient Instructions (Addendum)

## 2020-05-16 ENCOUNTER — Ambulatory Visit: Payer: Self-pay | Admitting: Urology

## 2020-05-27 ENCOUNTER — Encounter: Payer: Self-pay | Admitting: Internal Medicine

## 2020-05-30 ENCOUNTER — Encounter
Admission: RE | Disposition: A | Discharge status: Home / Self Care | Payer: Self-pay | Source: Home / Self Care | Attending: Internal Medicine

## 2020-05-30 ENCOUNTER — Ambulatory Visit: Payer: BC Managed Care – PPO | Admitting: Anesthesiology

## 2020-05-30 ENCOUNTER — Ambulatory Visit
Admission: RE | Admit: 2020-05-30 | Discharge: 2020-05-30 | Disposition: A | Discharge status: Home / Self Care | Payer: BC Managed Care – PPO | Attending: Internal Medicine | Admitting: Internal Medicine

## 2020-05-30 ENCOUNTER — Other Ambulatory Visit: Payer: Self-pay

## 2020-05-30 DIAGNOSIS — Z791 Long term (current) use of non-steroidal anti-inflammatories (NSAID): Secondary | ICD-10-CM | POA: Diagnosis not present

## 2020-05-30 DIAGNOSIS — Z79899 Other long term (current) drug therapy: Secondary | ICD-10-CM | POA: Diagnosis not present

## 2020-05-30 DIAGNOSIS — Z885 Allergy status to narcotic agent status: Secondary | ICD-10-CM | POA: Diagnosis not present

## 2020-05-30 DIAGNOSIS — Z85820 Personal history of malignant melanoma of skin: Secondary | ICD-10-CM | POA: Diagnosis not present

## 2020-05-30 DIAGNOSIS — Z79890 Hormone replacement therapy (postmenopausal): Secondary | ICD-10-CM | POA: Insufficient documentation

## 2020-05-30 DIAGNOSIS — Z85828 Personal history of other malignant neoplasm of skin: Secondary | ICD-10-CM | POA: Insufficient documentation

## 2020-05-30 DIAGNOSIS — Z7982 Long term (current) use of aspirin: Secondary | ICD-10-CM | POA: Diagnosis not present

## 2020-05-30 DIAGNOSIS — Z91048 Other nonmedicinal substance allergy status: Secondary | ICD-10-CM | POA: Diagnosis not present

## 2020-05-30 DIAGNOSIS — K635 Polyp of colon: Secondary | ICD-10-CM | POA: Diagnosis not present

## 2020-05-30 DIAGNOSIS — K640 First degree hemorrhoids: Secondary | ICD-10-CM | POA: Diagnosis not present

## 2020-05-30 DIAGNOSIS — K5730 Diverticulosis of large intestine without perforation or abscess without bleeding: Secondary | ICD-10-CM | POA: Diagnosis not present

## 2020-05-30 DIAGNOSIS — Z1211 Encounter for screening for malignant neoplasm of colon: Secondary | ICD-10-CM | POA: Diagnosis present

## 2020-05-30 DIAGNOSIS — Z7984 Long term (current) use of oral hypoglycemic drugs: Secondary | ICD-10-CM | POA: Diagnosis not present

## 2020-05-30 HISTORY — DX: Atherosclerosis of aorta: I70.0

## 2020-05-30 HISTORY — DX: Other specified abnormal findings of blood chemistry: R79.89

## 2020-05-30 HISTORY — DX: Type 2 diabetes mellitus without complications: E11.9

## 2020-05-30 HISTORY — PX: COLONOSCOPY WITH PROPOFOL: SHX5780

## 2020-05-30 HISTORY — DX: Gout, unspecified: M10.9

## 2020-05-30 HISTORY — DX: Other cervical disc degeneration, unspecified cervical region: M50.30

## 2020-05-30 LAB — GLUCOSE, CAPILLARY: Glucose-Capillary: 134 mg/dL — ABNORMAL HIGH (ref 70–99)

## 2020-05-30 SURGERY — COLONOSCOPY WITH PROPOFOL
Anesthesia: General

## 2020-05-30 MED ORDER — FENTANYL CITRATE (PF) 100 MCG/2ML IJ SOLN
INTRAMUSCULAR | Status: AC
  Filled 2020-05-30: qty 2

## 2020-05-30 MED ORDER — SODIUM CHLORIDE 0.9 % IV SOLN: INTRAVENOUS | Status: DC

## 2020-05-30 MED ORDER — PROPOFOL 500 MG/50ML IV EMUL
INTRAVENOUS | Status: DC | PRN
  Administered 2020-05-30: 50 ug/kg/min via INTRAVENOUS

## 2020-05-30 MED ORDER — MIDAZOLAM HCL 2 MG/2ML IJ SOLN
INTRAMUSCULAR | Status: AC
  Filled 2020-05-30: qty 2

## 2020-05-30 MED ORDER — LIDOCAINE HCL (CARDIAC) PF 100 MG/5ML IV SOSY
PREFILLED_SYRINGE | INTRAVENOUS | Status: DC | PRN
  Administered 2020-05-30: 60 mg via INTRAVENOUS

## 2020-05-30 MED ORDER — PROPOFOL 500 MG/50ML IV EMUL
INTRAVENOUS | Status: AC
  Filled 2020-05-30: qty 50

## 2020-05-30 MED ORDER — PROPOFOL 10 MG/ML IV BOLUS
INTRAVENOUS | Status: DC | PRN
  Administered 2020-05-30: 30 mg via INTRAVENOUS
  Administered 2020-05-30: 20 mg via INTRAVENOUS

## 2020-05-30 MED ORDER — MIDAZOLAM HCL 2 MG/2ML IJ SOLN
INTRAMUSCULAR | Status: DC | PRN
  Administered 2020-05-30: 2 mg via INTRAVENOUS

## 2020-05-30 MED ORDER — FENTANYL CITRATE (PF) 100 MCG/2ML IJ SOLN
INTRAMUSCULAR | Status: DC | PRN
  Administered 2020-05-30: 50 ug via INTRAVENOUS
  Administered 2020-05-30 (×2): 25 ug via INTRAVENOUS

## 2020-05-30 NOTE — Op Note (Signed)
[facility] Gastroenterology Patient Name: Mike Patel Procedure Date: 05/30/2020 12:27 PM MRN: [id number] Account #: [id number] Date of Birth: 03-31-1960 Admit Type: Outpatient Age: 60 Room: [facility] ENDO ROOM 2 Gender: Male Note Status: Finalized Procedure:             Colonoscopy Indications:           Surveillance: Personal history of adenomatous polyps                         on last colonoscopy > 5 years ago Providers:             Lorie Apley K. [name] MD, MD Referring MD:          Irven Easterly. Kary Kos, MD (Referring MD) Medicines:             Propofol per Anesthesia Complications:         No immediate complications. Procedure:             Pre-Anesthesia Assessment:                        - The risks and benefits of the procedure and the                         sedation options and risks were discussed with the                         patient. All questions were answered and informed                         consent was obtained.                        - Patient identification and proposed procedure were                         verified prior to the procedure by the nurse. The                         procedure was verified in the procedure room.                        - ASA Grade Assessment: II - A patient with mild                         systemic disease.                        - After reviewing the risks and benefits, the patient                         was deemed in satisfactory condition to undergo the                         procedure.                        After obtaining informed consent, the colonoscope was                         passed under direct  vision. Throughout the procedure,                         the patient's blood pressure, pulse, and oxygen                         saturations were monitored continuously. The                         Colonoscope was introduced through the anus and                         advanced to the the cecum, identified by  appendiceal                         orifice and ileocecal valve. The colonoscopy was                         performed without difficulty. The patient tolerated                         the procedure well. The quality of the bowel                         preparation was adequate. The ileocecal valve,                         appendiceal orifice, and rectum were photographed. Findings:      The perianal and digital rectal examinations were normal. Pertinent       negatives include normal sphincter tone and no palpable rectal lesions.      Non-bleeding internal hemorrhoids were found during retroflexion. The       hemorrhoids were Grade I (internal hemorrhoids that do not prolapse).      A few small-mouthed diverticula were found in the sigmoid colon.      Two sessile polyps were found in the sigmoid colon and distal sigmoid       colon. The polyps were 3 to 4 mm in size. These polyps were removed with       a jumbo cold forceps. Resection and retrieval were complete.      The exam was otherwise without abnormality. Impression:            - Non-bleeding internal hemorrhoids.                        - Diverticulosis in the sigmoid colon.                        - Two 3 to 4 mm polyps in the sigmoid colon and in the                         distal sigmoid colon, removed with a jumbo cold                         forceps. Resected and retrieved.                        - The examination was otherwise normal. Recommendation:        -  Patient has a contact number available for                         emergencies. The signs and symptoms of potential                         delayed complications were discussed with the patient.                         Return to normal activities tomorrow. Written                         discharge instructions were provided to the patient.                        - Resume previous diet.                        - Continue present medications.                        - Repeat  colonoscopy is recommended for surveillance.                         The colonoscopy date will be determined after                         pathology results from today's exam become available                         for review.                        - Return to GI office PRN.                        - The findings and recommendations were discussed with                         the patient. Procedure Code(s):     --- Professional ---                        [phone], Colonoscopy, flexible; with biopsy, single or                         multiple Diagnosis Code(s):     --- Professional ---                        K57.30, Diverticulosis of large intestine without                         perforation or abscess without bleeding                        K63.5, Polyp of colon                        K64.0, First degree hemorrhoids                        Z86.010, Personal history  of colonic polyps CPT copyright 2019 American Medical Association. All rights reserved. The codes documented in this report are preliminary and upon coder review may  be revised to meet current compliance requirements. Efrain Sella MD, MD 05/30/2020 1:20:23 PM This report has been signed electronically. Number of Addenda: 0 Note Initiated On: 05/30/2020 12:27 PM Scope Withdrawal Time: 0 hours 11 minutes 48 seconds  Total Procedure Duration: 0 hours 17 minutes 31 seconds  Estimated Blood Loss:  Estimated blood loss: none.      [facility]

## 2020-05-30 NOTE — Interval H&P Note (Signed)
History and Physical Interval Note:  05/30/2020 12:38 PM  Mike Patel  has presented today for surgery, with the diagnosis of history of adenomatous polyp of colon.  The various methods of treatment have been discussed with the patient and family. After consideration of risks, benefits and other options for treatment, the patient has consented to  Procedure(s): COLONOSCOPY WITH PROPOFOL (N/A) as a surgical intervention.  The patient's history has been reviewed, patient examined, no change in status, stable for surgery.  I have reviewed the patient's chart and labs.  Questions were answered to the patient's satisfaction.     [place], [place]

## 2020-05-30 NOTE — Transfer of Care (Signed)
Immediate Anesthesia Transfer of Care Note  Patient: Mike Patel  Procedure(s) Performed: COLONOSCOPY WITH PROPOFOL (N/A )  Patient Location: PACU  Anesthesia Type:General  Level of Consciousness: sedated  Airway & Oxygen Therapy: Patient Spontanous Breathing and Patient connected to nasal cannula oxygen  Post-op Assessment: Report given to RN and Post -op Vital signs reviewed and stable  Post vital signs: Reviewed and stable  Last Vitals:  Vitals Value Taken Time  BP 100/70 05/30/20 1321  Temp    Pulse 77 05/30/20 1322  Resp 12 05/30/20 1322  SpO2 98 % 05/30/20 1322  Vitals shown include unvalidated device data.  Last Pain:  Vitals:   05/30/20 1321  TempSrc:   PainSc: 0-No pain         Complications: No complications documented.

## 2020-05-30 NOTE — H&P (Signed)
Outpatient short stay form Pre-procedure 05/30/2020 12:38 PM Mike Patel K. Mike Patel, M.D.  Primary Physician: Maryland Pink, M.D.  Reason for visit:  Personal history of adenomatous colon polyps  History of present illness:                           Patient presents for colonoscopy for a personal hx of colon polyps. The patient denies abdominal pain, abnormal weight loss or rectal bleeding.      Current Facility-Administered Medications:  .  0.9 %  sodium chloride infusion, , Intravenous, Continuous, Twin, Benay Pike, MD, Last Rate: 20 mL/hr at 05/30/20 1207, New Bag at 05/30/20 1207  Medications Prior to Admission  Medication Sig Dispense Refill Last Dose  . amLODipine (NORVASC) 10 MG tablet Take 10 mg by mouth every morning.    05/30/2020 at 0615  . lisinopril (PRINIVIL,ZESTRIL) 40 MG tablet Take 1 tablet (40 mg total) by mouth daily. (Patient taking differently: Take 40 mg by mouth every morning.) 30 tablet 0 05/30/2020 at 0615  . metoprolol succinate (TOPROL-XL) 50 MG 24 hr tablet Take 50 mg by mouth every morning. Take with or immediately following a meal.   05/30/2020 at 0615  . Semaglutide (OZEMPIC, 0.25 OR 0.5 MG/DOSE, [place]) Inject into the skin.     Marland Kitchen aspirin EC 81 MG tablet Take 81 mg by mouth daily.   05/27/2020  . Cholecalciferol (VITAMIN D-3) 125 MCG (5000 UT) TABS Take 5,000 Units by mouth 2 (two) times daily.     . Cyanocobalamin 1000 MCG/ML KIT Inject 1,000 mcg into the muscle every 30 (thirty) days.      . fluticasone (FLONASE) 50 MCG/ACT nasal spray Place 2 sprays into both nostrils daily.     . hydrochlorothiazide (HYDRODIURIL) 12.5 MG tablet Take 12.5 mg by mouth daily.     . lansoprazole (PREVACID) 30 MG capsule Take 30 mg by mouth 2 (two) times daily before a meal.     . meloxicam (MOBIC) 15 MG tablet Take 15 mg by mouth daily.     . metFORMIN (GLUCOPHAGE-XR) 500 MG 24 hr tablet SMARTSIG:2 Tablet(s) By Mouth Every Evening     . methocarbamol (ROBAXIN) 500 MG tablet Take 500 mg by  mouth 2 (two) times daily.     . Omega-3 Fatty Acids (FISH OIL) 1000 MG CAPS Take 2,000 mg by mouth 2 (two) times daily.     . pravastatin (PRAVACHOL) 10 MG tablet Take 10 mg by mouth at bedtime.     . tadalafil (CIALIS) 5 MG tablet Take 5 mg by mouth daily.     Marland Kitchen testosterone cypionate (DEPOTESTOSTERONE CYPIONATE) 200 MG/ML injection Inject 200 mg into the muscle every 14 (fourteen) days.   3   . Turmeric 500 MG CAPS Take 500 mg by mouth 2 (two) times daily.        Allergies  Allergen Reactions  . Percocet [Oxycodone-Acetaminophen] Itching  . Tape Rash    Dermabond skin glue and Tape     Past Medical History:  Diagnosis Date  . Aortic atherosclerosis ([place])   . Arthritis    BACK  . Basal cell carcinoma 05/19/2019   Right lower lip. Nodular and infiltrative.  . DDD (degenerative disc disease), cervical   . Diabetes mellitus without complication ([place])   . Dysplastic nevus 07/14/2019   Left mid back. Moderate atypia, limited margins free.  Marland Kitchen Dysplastic nevus 07/14/2019   Right spinal mid back. Moderate atypia, deep margin involved.  Marland Kitchen  Dysplastic nevus 07/14/2019   Right upper arm. Moderate atypia, deep margin involved.   Marland Kitchen GERD (gastroesophageal reflux disease)   . Gout   . Hypertension   . Melanemia   . Melanoma ([place]) 06/29/2019   0.21m Level II,  spinal mid back WLE, [place]  . Obesity   . Pre-diabetes   . Sleep apnea    cpap    Review of systems:  Otherwise negative.    Physical Exam  Gen: Alert, oriented. Appears stated age.  HEENT: [place]/AT. PERRLA. Lungs: CTA, no wheezes. CV: RR nl S1, S2. Abd: soft, benign, no masses. BS+ Ext: No edema. Pulses 2+    Planned procedures: Proceed with colonoscopy. The patient understands the nature of the planned procedure, indications, risks, alternatives and potential complications including but not limited to bleeding, infection, perforation, damage to internal organs and possible oversedation/side effects from anesthesia.  The patient agrees and gives consent to proceed.  Please refer to procedure notes for findings, recommendations and patient disposition/instructions.     Mike Patel K. TAlice Patel M.D. Gastroenterology 05/30/2020  12:38 PM

## 2020-05-30 NOTE — Anesthesia Postprocedure Evaluation (Signed)
Anesthesia Post Note  Patient: Mike Patel  Procedure(s) Performed: COLONOSCOPY WITH PROPOFOL (N/A )  Patient location during evaluation: Phase II Anesthesia Type: General Level of consciousness: awake and alert, awake and oriented Pain management: pain level controlled Vital Signs Assessment: post-procedure vital signs reviewed and stable Respiratory status: spontaneous breathing, nonlabored ventilation and respiratory function stable Cardiovascular status: blood pressure returned to baseline and stable Postop Assessment: no apparent nausea or vomiting Anesthetic complications: no   No complications documented.   Last Vitals:  Vitals:   05/30/20 1331 05/30/20 1341  BP: 118/72 124/79  Pulse:    Resp:    Temp:    SpO2:      Last Pain:  Vitals:   05/30/20 1341  TempSrc:   PainSc: 0-No pain                 Phill Mutter

## 2020-05-30 NOTE — Anesthesia Preprocedure Evaluation (Signed)
Anesthesia Evaluation  Patient identified by MRN, date of birth, ID band Patient awake    Reviewed: Allergy & Precautions, NPO status , Patient's Chart, lab work & pertinent test results  History of Anesthesia Complications Negative for: history of anesthetic complications  Airway Mallampati: III  TM Distance: >3 FB Neck ROM: Full    Dental no notable dental hx.    Pulmonary sleep apnea and Continuous Positive Airway Pressure Ventilation , neg COPD,    breath sounds clear to auscultation- rhonchi (-) wheezing      Cardiovascular hypertension, Pt. on medications and Pt. on home beta blockers (-) CAD, (-) Past MI, (-) Cardiac Stents and (-) CABG  Rhythm:Regular Rate:Normal - Systolic murmurs and - Diastolic murmurs    Neuro/Psych negative neurological ROS  negative psych ROS   GI/Hepatic Neg liver ROS, GERD  Medicated and Controlled,  Endo/Other  diabetesMorbid obesity  Renal/GU negative Renal ROS     Musculoskeletal  (+) Arthritis ,   Abdominal (+) + obese,   Peds  Hematology negative hematology ROS (+)   Anesthesia Other Findings Past Medical History: No date: Arthritis     Comment:  BACK No date: GERD (gastroesophageal reflux disease) No date: Hypertension No date: Obesity No date: Pre-diabetes No date: Sleep apnea     Comment:  cpap   Reproductive/Obstetrics                             Anesthesia Physical  Anesthesia Plan  ASA: III  Anesthesia Plan: General   Post-op Pain Management:    Induction: Intravenous  PONV Risk Score and Plan: 1 and Propofol infusion and TIVA  Airway Management Planned: Oral ETT  Additional Equipment:   Intra-op Plan:   Post-operative Plan: Extubation in OR  Informed Consent: I have reviewed the patients History and Physical, chart, labs and discussed the procedure including the risks, benefits and alternatives for the proposed anesthesia  with the patient or authorized representative who has indicated his/her understanding and acceptance.     Dental advisory given  Plan Discussed with: CRNA and Anesthesiologist  Anesthesia Plan Comments:         Anesthesia Quick Evaluation

## 2020-05-31 ENCOUNTER — Encounter: Payer: Self-pay | Admitting: Internal Medicine

## 2020-05-31 LAB — SURGICAL PATHOLOGY

## 2020-08-01 ENCOUNTER — Other Ambulatory Visit: Payer: Self-pay

## 2020-08-01 ENCOUNTER — Ambulatory Visit (INDEPENDENT_AMBULATORY_CARE_PROVIDER_SITE_OTHER): Payer: BC Managed Care – PPO | Admitting: Urology

## 2020-08-01 ENCOUNTER — Encounter: Payer: Self-pay | Admitting: Urology

## 2020-08-01 VITALS — BP 153/88 | HR 85 | Ht 69.0 in | Wt 315.0 lb

## 2020-08-01 DIAGNOSIS — N5201 Erectile dysfunction due to arterial insufficiency: Secondary | ICD-10-CM | POA: Diagnosis not present

## 2020-08-01 DIAGNOSIS — N400 Enlarged prostate without lower urinary tract symptoms: Secondary | ICD-10-CM

## 2020-08-01 LAB — URINALYSIS, COMPLETE
Bilirubin, UA: NEGATIVE
Glucose, UA: NEGATIVE
Ketones, UA: NEGATIVE
Leukocytes,UA: NEGATIVE
Nitrite, UA: NEGATIVE
Protein,UA: NEGATIVE
RBC, UA: NEGATIVE
Specific Gravity, UA: 1.02 (ref 1.005–1.030)
Urobilinogen, Ur: 0.2 mg/dL (ref 0.2–1.0)
pH, UA: 7 (ref 5.0–7.5)

## 2020-08-01 LAB — MICROSCOPIC EXAMINATION

## 2020-08-01 LAB — BLADDER SCAN AMB NON-IMAGING: Scan Result: 0

## 2020-08-01 MED ORDER — TADALAFIL 20 MG PO TABS: 20.0000 mg | ORAL_TABLET | Freq: Every day | ORAL | 3 refills | Status: DC | PRN

## 2020-08-01 NOTE — Progress Notes (Signed)
08/01/2020 1:08 PM   Mike Patel [birth year] [id number]  Referring provider: Maryland Pink, MD [address] [place],  [place] [address]  Chief Complaint  Patient presents with   Benign Prostatic Hypertrophy    Urologic history: 1.  BPH TURP in October 2015  2.  Hypogonadism On TRT managed by Dr. Kary Kos  3.  Erectile dysfunction Tadalafil 20 mg  4..  History hematospermia  HPI: 60 y.o. male presents for annual follow-up.  States he has done well in the last year No bothersome LUTS Denies gross hematuria or dysuria States his PSA was recently drawn by his employer and it was supposed to be faxed however we have not received.  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 Nodular and infiltrative.   DDD (degenerative disc disease), cervical    Diabetes mellitus without complication ([place])    Dysplastic nevus 07/14/2019   Left mid back. Moderate atypia, limited margins free.   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 Moderate atypia, deep margin involved.   Dysplastic nevus 07/14/2019   Right upper arm. Moderate atypia, deep margin involved.    GERD (gastroesophageal reflux disease)    Gout    Hypertension    Melanemia    Melanoma ([place]) 06/29/2019   0.81m Level II,  spinal mid back WLE, [place]   Obesity    Pre-diabetes    Sleep apnea    cpap    Surgical History: Past Surgical History:  Procedure Laterality Date   BACK SURGERY     x3   CHOLECYSTECTOMY N/A 12/02/2017   Procedure: LAPAROSCOPIC CHOLECYSTECTOMY WITH INTRAOPERATIVE CHOLANGIOGRAM;  Surgeon: POlean Ree MD;  Location: [facility] ORS;  Service: General;  Laterality: N/A;   COLONOSCOPY N/A 05/24/2014   Procedure: COLONOSCOPY;  Surgeon: PHulen Luster MD;  Location:  [facility] ENDOSCOPY;  Service: Gastroenterology;  Laterality: N/A;   COLONOSCOPY WITH PROPOFOL N/A 05/30/2020   Procedure: COLONOSCOPY WITH PROPOFOL;  Surgeon: [place], TBenay Pike MD;  Location: [facility] ENDOSCOPY;  Service: Gastroenterology;  Laterality: N/A;   ELBOW SURGERY Right    tennis elbow   ERCP N/A 11/18/2017   Procedure: ENDOSCOPIC RETROGRADE CHOLANGIOPANCREATOGRAPHY (ERCP);  Surgeon: WLucilla Lame MD;  [facility];  Service: Endoscopy;  Laterality: N/A;   ESOPHAGOGASTRODUODENOSCOPY N/A 05/24/2014   Procedure: ESOPHAGOGASTRODUODENOSCOPY (EGD);  Surgeon: PHulen Luster MD;  [facility];  Service: Gastroenterology;  Laterality: N/A;   PROSTATE SURGERY     SPINE SURGERY     l-spine x 2    Home Medications:  Allergies as of 08/01/2020       Reactions   Percocet [oxycodone-acetaminophen] Itching   Tape Rash   Dermabond skin glue and Tape        Medication List        Accurate as of August 01, 2020  1:08 PM. If you have any questions, ask your nurse or doctor.          STOP taking these medications    Cyanocobalamin 1000 MCG/ML Kit Stopped by: SAbbie Sons MD   methocarbamol 500 MG tablet Commonly known as: ROBAXIN Stopped by: SAbbie Sons MD   OZEMPIC (0.25 OR 0.5 MG/DOSE) [place] Stopped by: SAbbie Sons MD  TAKE these medications    amLODipine 10 MG tablet Commonly known as: NORVASC Take 10 mg by mouth every morning.   aspirin EC 81 MG tablet Take 81 mg by mouth daily.   Fish Oil 1000 MG Caps Take 2,000 mg by mouth 2 (two) times daily.   fluticasone 50 MCG/ACT nasal spray Commonly known as: FLONASE Place 2 sprays into both nostrils daily.   hydrochlorothiazide 12.5 MG tablet Commonly known as: HYDRODIURIL Take 12.5 mg by mouth daily.   lansoprazole 30 MG capsule Commonly known as: PREVACID Take 30 mg by mouth 2 (two) times daily before a meal.   lisinopril 40 MG tablet Commonly known as: ZESTRIL Take 1 tablet (40 mg  total) by mouth daily. What changed: when to take this   meloxicam 15 MG tablet Commonly known as: MOBIC Take 15 mg by mouth daily.   metFORMIN 500 MG 24 hr tablet Commonly known as: GLUCOPHAGE-XR SMARTSIG:2 Tablet(s) By Mouth Every Evening   metoprolol succinate 50 MG 24 hr tablet Commonly known as: TOPROL-XL Take 50 mg by mouth every morning. Take with or immediately following a meal.   pravastatin 10 MG tablet Commonly known as: PRAVACHOL Take 10 mg by mouth at bedtime.   tadalafil 5 MG tablet Commonly known as: CIALIS Take 5 mg by mouth daily.   testosterone cypionate 200 MG/ML injection Commonly known as: DEPOTESTOSTERONE CYPIONATE Inject 200 mg into the muscle every 14 (fourteen) days.   Turmeric 500 MG Caps Take 500 mg by mouth 2 (two) times daily.   Vitamin D-3 125 MCG (5000 UT) Tabs Take 5,000 Units by mouth 2 (two) times daily.        Allergies:  Allergies  Allergen Reactions   Percocet [Oxycodone-Acetaminophen] Itching   Tape Rash    Dermabond skin glue and Tape    Family History: Family History  Problem Relation Age of Onset   Hypertension Mother     Social History:  reports that he has never smoked. He has never used smokeless tobacco. He reports that he does not drink alcohol and does not use drugs.   Physical Exam: BP (!) 153/88   Pulse 85   Ht _0  (1.753 m)   Wt (!) 315 lb (142.9 kg)   BMI 46.52 kg/m   Constitutional:  Alert and oriented, No acute distress. HEENT: [place] AT, moist mucus membranes.  Trachea midline, no masses. Cardiovascular: No clubbing, cyanosis, or edema. GU: Prostate 60 g, smooth without nodules Lymph: No cervical or inguinal lymphadenopathy. Skin: No rashes, bruises or suspicious lesions. Neurologic: Grossly intact, no focal deficits, moving all 4 extremities. Psychiatric: Normal mood and affect.   Assessment & Plan:    1. Benign prostatic hyperplasia without LUTS Status post TURP 2015 Bladder scan PVR 0  mL Will contact employer to get PSA results faxed  2.  Erectile dysfunction Stable Tadalafil refilled  3.  Hypogonadism Managed by Dr. Kary Kos  Continue annual follow-up   Abbie Sons, MD  [facility] [address] [place], [place] [address] [phone]

## 2020-08-30 ENCOUNTER — Other Ambulatory Visit: Payer: Self-pay | Admitting: Family Medicine

## 2020-08-30 MED ORDER — TADALAFIL 20 MG PO TABS: 20.0000 mg | ORAL_TABLET | Freq: Every day | ORAL | 1 refills | Status: DC | PRN

## 2020-09-05 ENCOUNTER — Other Ambulatory Visit: Payer: Self-pay | Admitting: Family Medicine

## 2020-12-07 ENCOUNTER — Encounter: Payer: Self-pay | Admitting: Orthopedic Surgery

## 2020-12-07 ENCOUNTER — Other Ambulatory Visit: Payer: Self-pay | Admitting: Orthopedic Surgery

## 2020-12-07 DIAGNOSIS — Z01818 Encounter for other preprocedural examination: Secondary | ICD-10-CM

## 2020-12-07 NOTE — H&P (Signed)
NAME: Mike Patel MRN:   [id number] DOB:   08-Mar-1960     HISTORY AND PHYSICAL  CHIEF COMPLAINT:  right hip pain  HISTORY:   Mike Patel a 60 y.o. male  with right  Hip Pain Patient complains of right hip pain. Onset of the symptoms was several years ago. Inciting event: known DJD. The patient reports the hip pain is worse with weight bearing. Associated symptoms: none. Aggravating symptoms include: any weight bearing. Patient has had no prior hip problems. Previous visits for this problem: multiple, this is a longstanding diagnosis. Last seen several weeks ago by Dr. Harlow Mares . Evaluation to date: plain films, which were abnormal  osteoarthritis . Treatment to date: OTC analgesics, which have been somewhat effective, prescription analgesics, which have been somewhat effective, home exercise program, which has been somewhat effective, and physical therapy, which has been somewhat effective.  Plan for right total hip replacement  PAST MEDICAL HISTORY:   Past Medical History:  Diagnosis Date   Aortic atherosclerosis ([place])    Arthritis    BACK   Basal cell carcinoma 05/19/2019   Right lower lip. Nodular and infiltrative.   DDD (degenerative disc disease), cervical    Diabetes mellitus without complication ([place])    Dysplastic nevus 07/14/2019   Left mid back. Moderate atypia, limited margins free.   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 Moderate atypia, deep margin involved.   Dysplastic nevus 07/14/2019   Right upper arm. Moderate atypia, deep margin involved.    GERD (gastroesophageal reflux disease)    Gout    Hypertension    Melanemia    Melanoma ([place]) 06/29/2019   0.48mm Level II,  spinal mid back WLE, [place]   Obesity    Pre-diabetes    Sleep apnea    cpap    PAST SURGICAL HISTORY:   Past Surgical History:  Procedure Laterality Date   BACK SURGERY     x3   CHOLECYSTECTOMY N/A 12/02/2017   Procedure: LAPAROSCOPIC CHOLECYSTECTOMY WITH INTRAOPERATIVE  CHOLANGIOGRAM;  Surgeon: Olean Ree, MD;  Location: [facility] ORS;  Service: General;  Laterality: N/A;   COLONOSCOPY N/A 05/24/2014   Procedure: COLONOSCOPY;  Surgeon: Hulen Luster, MD;  Location: [facility] ENDOSCOPY;  Service: Gastroenterology;  Laterality: N/A;   COLONOSCOPY WITH PROPOFOL N/A 05/30/2020   Procedure: COLONOSCOPY WITH PROPOFOL;  Surgeon: Toledo, Benay Pike, MD;  Location: [facility] ENDOSCOPY;  Service: Gastroenterology;  Laterality: N/A;   ELBOW SURGERY Right    tennis elbow   ERCP N/A 11/18/2017   Procedure: ENDOSCOPIC RETROGRADE CHOLANGIOPANCREATOGRAPHY (ERCP);  Surgeon: Lucilla Lame, MD;  Location: [facility] ENDOSCOPY;  Service: Endoscopy;  Laterality: N/A;   ESOPHAGOGASTRODUODENOSCOPY N/A 05/24/2014   Procedure: ESOPHAGOGASTRODUODENOSCOPY (EGD);  Surgeon: Hulen Luster, MD;  Location: [facility] ENDOSCOPY;  Service: Gastroenterology;  Laterality: N/A;   PROSTATE SURGERY     SPINE SURGERY     l-spine x 2    MEDICATIONS:  (Not in a hospital admission)   ALLERGIES:   Allergies  Allergen Reactions   Percocet [Oxycodone-Acetaminophen] Itching   Tape Rash    Dermabond skin glue and Tape    REVIEW OF SYSTEMS:   Negative except HPI  FAMILY HISTORY:   Family History  Problem Relation Age of Onset   Hypertension Mother     SOCIAL HISTORY:   reports that he has never smoked. He has never used smokeless tobacco. He reports that he does not drink alcohol and does not use drugs.  PHYSICAL EXAM:  General appearance: alert, cooperative, and no distress Neck: no JVD and supple, symmetrical, trachea midline Resp: clear to auscultation bilaterally Cardio: regular rate and rhythm, S1, S2 normal, no murmur, click, rub or gallop GI: soft, non-tender; bowel sounds normal; no masses,  no organomegaly Extremities: extremities normal, atraumatic, no cyanosis or edema and Homans sign is negative, no sign of DVT Pulses: 2+ and symmetric Skin: Skin color, texture, turgor normal. No rashes or lesions Neurologic:  Alert and oriented X 3, normal strength and tone. Normal symmetric reflexes. Normal coordination and gait Incision/Wound:    LABORATORY STUDIES: No results for input(s): WBC, HGB, HCT, PLT in the last 72 hours.  No results for input(s): NA, K, CL, CO2, GLUCOSE, BUN, CREATININE, CALCIUM in the last 72 hours.  STUDIES/RESULTS:  No results found.  ASSESSMENT:  End stage osteoarthritis right hip        Active Problems:   * No active hospital problems. *    PLAN:  Right Primary Total Hip   Carlynn Spry 12/07/2020. 11:38 AM

## 2020-12-07 NOTE — H&P (Deleted)
  The note originally documented on this encounter has been moved the the encounter in which it belongs.  

## 2020-12-09 ENCOUNTER — Other Ambulatory Visit: Payer: BC Managed Care – PPO

## 2020-12-13 ENCOUNTER — Ambulatory Visit (INDEPENDENT_AMBULATORY_CARE_PROVIDER_SITE_OTHER): Payer: BC Managed Care – PPO | Admitting: Dermatology

## 2020-12-13 ENCOUNTER — Encounter
Admission: RE | Admit: 2020-12-13 | Discharge: 2020-12-13 | Disposition: A | Discharge status: Home / Self Care | Payer: BC Managed Care – PPO | Source: Ambulatory Visit | Attending: Orthopedic Surgery | Admitting: Orthopedic Surgery

## 2020-12-13 ENCOUNTER — Other Ambulatory Visit: Payer: Self-pay

## 2020-12-13 VITALS — BP 141/90 | HR 67 | Temp 98.6°F | Resp 18 | Ht 70.0 in | Wt 302.0 lb

## 2020-12-13 DIAGNOSIS — D225 Melanocytic nevi of trunk: Secondary | ICD-10-CM

## 2020-12-13 DIAGNOSIS — L918 Other hypertrophic disorders of the skin: Secondary | ICD-10-CM

## 2020-12-13 DIAGNOSIS — L814 Other melanin hyperpigmentation: Secondary | ICD-10-CM

## 2020-12-13 DIAGNOSIS — Z01812 Encounter for preprocedural laboratory examination: Secondary | ICD-10-CM

## 2020-12-13 DIAGNOSIS — Z85820 Personal history of malignant melanoma of skin: Secondary | ICD-10-CM | POA: Diagnosis not present

## 2020-12-13 DIAGNOSIS — L570 Actinic keratosis: Secondary | ICD-10-CM | POA: Diagnosis not present

## 2020-12-13 DIAGNOSIS — Z01818 Encounter for other preprocedural examination: Secondary | ICD-10-CM | POA: Insufficient documentation

## 2020-12-13 DIAGNOSIS — Z1283 Encounter for screening for malignant neoplasm of skin: Secondary | ICD-10-CM

## 2020-12-13 DIAGNOSIS — Z85828 Personal history of other malignant neoplasm of skin: Secondary | ICD-10-CM | POA: Diagnosis not present

## 2020-12-13 DIAGNOSIS — L821 Other seborrheic keratosis: Secondary | ICD-10-CM

## 2020-12-13 DIAGNOSIS — L578 Other skin changes due to chronic exposure to nonionizing radiation: Secondary | ICD-10-CM

## 2020-12-13 DIAGNOSIS — D1800 Hemangioma unspecified site: Secondary | ICD-10-CM

## 2020-12-13 DIAGNOSIS — D229 Melanocytic nevi, unspecified: Secondary | ICD-10-CM

## 2020-12-13 DIAGNOSIS — Z86018 Personal history of other benign neoplasm: Secondary | ICD-10-CM

## 2020-12-13 LAB — BASIC METABOLIC PANEL
Anion gap: 8 (ref 5–15)
BUN: 15 mg/dL (ref 8–23)
CO2: 26 mmol/L (ref 22–32)
Calcium: 8.9 mg/dL (ref 8.9–10.3)
Chloride: 105 mmol/L (ref 98–111)
Creatinine, Ser: 0.87 mg/dL (ref 0.61–1.24)
GFR, Estimated: >60 mL/min (ref >60)
Glucose, Bld: 149 mg/dL — ABNORMAL HIGH (ref 70–99)
Potassium: 4 mmol/L (ref 3.5–5.1)
Sodium: 139 mmol/L (ref 135–145)

## 2020-12-13 LAB — URINALYSIS, ROUTINE W REFLEX MICROSCOPIC
Bacteria, UA: NONE SEEN
Bilirubin Urine: NEGATIVE
Glucose, UA: >=500 mg/dL — AB
Hgb urine dipstick: NEGATIVE
Ketones, ur: NEGATIVE mg/dL
Leukocytes,Ua: NEGATIVE
Nitrite: NEGATIVE
Protein, ur: NEGATIVE mg/dL
Specific Gravity, Urine: 1.033 — ABNORMAL HIGH (ref 1.005–1.030)
pH: 5 (ref 5.0–8.0)

## 2020-12-13 LAB — PROTIME-INR
INR: 1.1 (ref 0.8–1.2)
Prothrombin Time: 13.9 seconds (ref 11.4–15.2)

## 2020-12-13 LAB — TYPE AND SCREEN
ABO/RH(D): O POS
Antibody Screen: NEGATIVE

## 2020-12-13 LAB — SURGICAL PCR SCREEN
MRSA, PCR: NEGATIVE
Staphylococcus aureus: POSITIVE — AB

## 2020-12-13 LAB — CBC
HCT: 50.8 % (ref 39.0–52.0)
Hemoglobin: 16.7 g/dL (ref 13.0–17.0)
MCH: 31 pg (ref 26.0–34.0)
MCHC: 32.9 g/dL (ref 30.0–36.0)
MCV: 94.2 fL (ref 80.0–100.0)
Platelets: 247 10*3/uL (ref 150–400)
RBC: 5.39 MIL/uL (ref 4.22–5.81)
RDW: 13.2 % (ref 11.5–15.5)
WBC: 6.5 10*3/uL (ref 4.0–10.5)
nRBC: 0 % (ref 0.0–0.2)

## 2020-12-13 LAB — APTT: aPTT: 30 seconds (ref 24–36)

## 2020-12-13 NOTE — Patient Instructions (Addendum)
Your procedure is scheduled on: Monday December 19, 2020. Report to Day Surgery inside [facility] 2nd floor. To find out your arrival time please call [phone] between 1PM - 3PM on Friday December 16, 2020.  Remember: Instructions that are not followed completely may result in serious medical risk,  up to and including death, or upon the discretion of your surgeon and anesthesiologist your  surgery may need to be rescheduled.     _X__ 1. Do not eat food or drink fluids after midnight the night before your procedure.                 No chewing gum or hard candies.   __X__2.  On the morning of surgery brush your teeth with toothpaste and water, you                may rinse your mouth with mouthwash if you wish.  Do not swallow any toothpaste or mouthwash.     _X__ 3.  No Alcohol for 24 hours before or after surgery.   _X__ 4.  Do Not Smoke or use e-cigarettes For 24 Hours Prior to Your Surgery.                 Do not use any chewable tobacco products for at least 6 hours prior to                 Surgery.  _X__  5.  Do not use any recreational drugs (marijuana, cocaine, heroin, ecstasy, MDMA or other)                For at least one week prior to your surgery.  Combination of these drugs with anesthesia                May have life threatening results.  __X__ 6.  Notify your doctor if there is any change in your medical condition      (cold, fever, infections).     Do not wear jewelry, make-up, hairpins, clips or nail polish. Do not wear lotions, powders, or perfumes. You may wear deodorant. Do not shave 48 hours prior to surgery. Men may shave face and neck. Do not bring valuables to the hospital.    [facility] is not responsible for any belongings or valuables.  Contacts, dentures or bridgework may not be worn into surgery. Leave your suitcase in the car. After surgery it may be brought to your room. For patients admitted to the hospital, discharge  time is determined by your treatment team.   Patients discharged the day of surgery will not be allowed to drive home.   Make arrangements for someone to be with you for the first 24 hours of your Same Day Discharge.    Please read over the following fact sheets that you were given:   Total Joint Packet    __X__ Take these medicines the morning of surgery with A SIP OF WATER:    1. amLODipine (NORVASC) 10 MG   2. metoprolol succinate (TOPROL-XL) 50 MG   3. lansoprazole (PREVACID) 30 MG  4.  5.  6.  ____ Fleet Enema (as directed)   __X__ Use CHG Soap (or wipes) as directed  ____ Use Benzoyl Peroxide Gel as instructed  ____ Use inhalers on the day of surgery  __X__ Stop  metFORMIN (GLUCOPHAGE-XR) 500 MG 2 days prior to surgery (last day Friday 11/25//22)   __X__ Stop Wilder Glade 10 mg 3 days before your procedure. (  Take last dose Thursday 12/15/20)   ____ Take 1/2 of usual insulin dose the night before surgery. No insulin the morning          of surgery.   __X__ Call your PCP, cardiologist, or Pulmonologist if taking Coumadin/Plavix/aspirin and ask when to stop before your surgery.   __X__ One Week prior to surgery- Stop Anti-inflammatories such as Ibuprofen, Aleve, Advil, Motrin, meloxicam (MOBIC), diclofenac, etodolac, ketorolac, Toradol, Daypro, piroxicam, Goody's or BC powders. OK TO USE TYLENOL IF NEEDED   __X__ Stop ALL supplements until after surgery. Turmeric, Omega-3 Fatty Acids, Melatonin   ____ Bring C-Pap to the hospital.    If you have any questions regarding your pre-procedure instructions,  Please call Pre-admit Testing at [phone]

## 2020-12-13 NOTE — Patient Instructions (Addendum)
If You Need Anything After Your Visit  If you have any questions or concerns for your doctor, please call our main line at [phone] and press option 4 to reach your doctor's medical assistant. If no one answers, please leave a voicemail as directed and we will return your call as soon as possible. Messages left after 4 pm will be answered the following business day.   You may also send us a message via MyChart. We typically respond to MyChart messages within 1-2 business days.  For prescription refills, please ask your pharmacy to contact our office. Our fax number is [phone].  If you have an urgent issue when the clinic is closed that cannot wait until the next business day, you can page your doctor at the number below.    Please note that while we do our best to be available for urgent issues outside of office hours, we are not available 24/7.   If you have an urgent issue and are unable to reach us, you may choose to seek medical care at your doctor's office, retail clinic, urgent care center, or emergency room.  If you have a medical emergency, please immediately call 911 or go to the emergency department.  Pager Numbers  - Dr. Kowalski: [phone]  - Dr. Moye: [phone]  - Dr. Stewart: [phone]  In the event of inclement weather, please call our main line at [phone] for an update on the status of any delays or closures.  Dermatology Medication Tips: Please keep the boxes that topical medications come in in order to help keep track of the instructions about where and how to use these. Pharmacies typically print the medication instructions only on the boxes and not directly on the medication tubes.   If your medication is too expensive, please contact our office at [phone] option 4 or send us a message through MyChart.   We are unable to tell what your co-pay for medications will be in advance as this is different depending on your insurance coverage.  However, we may be able to find a substitute medication at lower cost or fill out paperwork to get insurance to cover a needed medication.   If a prior authorization is required to get your medication covered by your insurance company, please allow us 1-2 business days to complete this process.  Drug prices often vary depending on where the prescription is filled and some pharmacies may offer cheaper prices.  The website www.goodrx.com contains coupons for medications through different pharmacies. The prices here do not account for what the cost may be with help from insurance (it may be cheaper with your insurance), but the website can give you the price if you did not use any insurance.  - You can print the associated coupon and take it with your prescription to the pharmacy.  - You may also stop by our office during regular business hours and pick up a GoodRx coupon card.  - If you need your prescription sent electronically to a different pharmacy, notify our office through [facility] MyChart or by phone at [phone] option 4.     Si Usted Necesita Algo Despus de Su Visita  Tambin puede enviarnos un mensaje a travs de MyChart. Por lo general respondemos a los mensajes de MyChart en el transcurso de 1 a 2 das hbiles.  Para renovar recetas, por favor pida a su farmacia que se ponga en contacto con nuestra oficina. Nuestro nmero de fax es el [phone].  Si tiene   un asunto urgente cuando la clnica est cerrada y que no puede esperar hasta el siguiente da hbil, puede llamar/localizar a su doctor(a) al nmero que aparece a continuacin.   Por favor, tenga en cuenta que aunque hacemos todo lo posible para estar disponibles para asuntos urgentes fuera del horario de oficina, no estamos disponibles las 24 horas del da, los 7 das de la semana.   Si tiene un problema urgente y no puede comunicarse con nosotros, puede optar por buscar atencin mdica  en el consultorio de su  doctor(a), en una clnica privada, en un centro de atencin urgente o en una sala de emergencias.  Si tiene una emergencia mdica, por favor llame inmediatamente al 911 o vaya a la sala de emergencias.  Nmeros de bper  - Dr. Kowalski: [phone]  - Dra. Moye: [phone]  - Dra. Stewart: [phone]  En caso de inclemencias del tiempo, por favor llame a nuestra lnea principal al [phone] para una actualizacin sobre el estado de cualquier retraso o cierre.  Consejos para la medicacin en dermatologa: Por favor, guarde las cajas en las que vienen los medicamentos de uso tpico para ayudarle a seguir las instrucciones sobre dnde y cmo usarlos. Las farmacias generalmente imprimen las instrucciones del medicamento slo en las cajas y no directamente en los tubos del medicamento.   Si su medicamento es muy caro, por favor, pngase en contacto con nuestra oficina llamando al [phone] y presione la opcin 4 o envenos un mensaje a travs de MyChart.   No podemos decirle cul ser su copago por los medicamentos por adelantado ya que esto es diferente dependiendo de la cobertura de su seguro. Sin embargo, es posible que podamos encontrar un medicamento sustituto a menor costo o llenar un formulario para que el seguro cubra el medicamento que se considera necesario.   Si se requiere una autorizacin previa para que su compaa de seguros cubra su medicamento, por favor permtanos de 1 a 2 das hbiles para completar este proceso.  Los precios de los medicamentos varan con frecuencia dependiendo del lugar de dnde se surte la receta y alguna farmacias pueden ofrecer precios ms baratos.  El sitio web www.goodrx.com tiene cupones para medicamentos de diferentes farmacias. Los precios aqu no tienen en cuenta lo que podra costar con la ayuda del seguro (puede ser ms barato con su seguro), pero el sitio web puede darle el precio si no utiliz ningn seguro.  - Puede imprimir el cupn  correspondiente y llevarlo con su receta a la farmacia.  - Tambin puede pasar por nuestra oficina durante el horario de atencin regular y recoger una tarjeta de cupones de GoodRx.  - Si necesita que su receta se enve electrnicamente a una farmacia diferente, informe a nuestra oficina a travs de MyChart de [facility] o por telfono llamando al [phone] y presione la opcin 4.   Cryotherapy Aftercare  Wash gently with soap and water everyday.   Apply Vaseline and Band-Aid daily until healed.  

## 2020-12-13 NOTE — Progress Notes (Signed)
Follow-Up Visit   Subjective  Mike Patel is a 60 y.o. male who presents for the following: Upper body skin exam (Hx of Melanoma spinal mid back, hx of BCC R lower hip, hox of Dysplastic Nevi L mid back, R spinal mid back, R upper arm, hx of AKs).  No new spots.  He is getting a hip replacement next week.   The following portions of the chart were reviewed this encounter and updated as appropriate:       Review of Systems:  No other skin or systemic complaints except as noted in HPI or Assessment and Plan.  Objective  Well appearing patient in no apparent distress; mood and affect are within normal limits.  All skin waist up examined.  Spinal mid back Well healed scar with no evidence of recurrence  L spinal upper back  L spinal upper back 4.60mm brown macule, lighter central  L dorsum hand x 2 (2) Pink scaly papules    Assessment & Plan   Lentigines - Scattered tan macules - Due to sun exposure - Benign-appearing, observe - Recommend daily broad spectrum sunscreen SPF 30+ to sun-exposed areas, reapply every 2 hours as needed. - Call for any changes - back  Seborrheic Keratoses - Stuck-on, waxy, tan-brown papules and/or plaques  - Benign-appearing - Discussed benign etiology and prognosis. - Observe - Call for any changes - L dorsum hand  Melanocytic Nevi - Tan-brown and/or pink-flesh-colored symmetric macules and papules - Benign appearing on exam today - Observation - Call clinic for new or changing moles - Recommend daily use of broad spectrum spf 30+ sunscreen to sun-exposed areas.  - back  Hemangiomas - Red papules - Discussed benign nature - Observe - Call for any changes - abdomen  Acrochordons (Skin Tags) - Fleshy, skin-colored pedunculated papules - Benign appearing.  - Observe. - If desired, they can be removed with an in office procedure that is not covered by insurance. - Please call the clinic if you notice any new or changing  lesions.   Actinic Damage - Chronic condition, secondary to cumulative UV/sun exposure - diffuse scaly erythematous macules with underlying dyspigmentation - Recommend daily broad spectrum sunscreen SPF 30+ to sun-exposed areas, reapply every 2 hours as needed.  - Staying in the shade or wearing long sleeves, sun glasses (UVA+UVB protection) and wide brim hats (4-inch brim around the entire circumference of the hat) are also recommended for sun protection.  - Call for new or changing lesions.  Skin cancer screening performed today.  History of Basal Cell Carcinoma of the Skin - No evidence of recurrence today - Recommend regular full body skin exams - Recommend daily broad spectrum sunscreen SPF 30+ to sun-exposed areas, reapply every 2 hours as needed.  - Call if any new or changing lesions are noted between office visits  - R lower lip  History of Dysplastic Nevi - No evidence of recurrence today - Recommend regular full body skin exams - Recommend daily broad spectrum sunscreen SPF 30+ to sun-exposed areas, reapply every 2 hours as needed.  - Call if any new or changing lesions are noted between office visits  - L mid back, R spinal mid back, R upper arm  History of melanoma Spinal mid back  06/2019 0.51mm Level II,  spinal mid back WLE, [place]  Clear. Observe for recurrence. Call clinic for new or changing lesions.  Recommend regular skin exams, daily broad-spectrum spf 30+ sunscreen use, and photoprotection.  Nevus L spinal upper back  Benign-appearing.  Stable. Observation.  Call clinic for new or changing moles.  Recommend daily use of broad spectrum spf 30+ sunscreen to sun-exposed areas.    AK (actinic keratosis) (2) L dorsum hand x 2  Actinic keratoses are precancerous spots that appear secondary to cumulative UV radiation exposure/sun exposure over time. They are chronic with expected duration over 1 year. A portion of actinic keratoses will progress to squamous  cell carcinoma of the skin. It is not possible to reliably predict which spots will progress to skin cancer and so treatment is recommended to prevent development of skin cancer.  Recommend daily broad spectrum sunscreen SPF 30+ to sun-exposed areas, reapply every 2 hours as needed.  Recommend staying in the shade or wearing long sleeves, sun glasses (UVA+UVB protection) and wide brim hats (4-inch brim around the entire circumference of the hat). Call for new or changing lesions.   Destruction of lesion - L dorsum hand x 2  Destruction method: cryotherapy   Informed consent: discussed and consent obtained   Lesion destroyed using liquid nitrogen: Yes   Region frozen until ice ball extended beyond lesion: Yes   Outcome: patient tolerated procedure well with no complications   Post-procedure details: wound care instructions given   Additional details:  Prior to procedure, discussed risks of blister formation, small wound, skin dyspigmentation, or rare scar following cryotherapy. Recommend Vaseline ointment to treated areas while healing.   Return in about 6 months (around 06/12/2021) for UBSE, Hx of Melanoma, Hx of BCC, Hx of Dysplastic nevi, Hx of AKs.  I, Othelia Pulling, RMA, am acting as scribe for [name], MD .  Documentation: I have reviewed the above documentation for accuracy and completeness, and I agree with the above.  [name] MD

## 2020-12-14 ENCOUNTER — Other Ambulatory Visit: Admission: RE | Admit: 2020-12-14 | Payer: BC Managed Care – PPO | Source: Ambulatory Visit

## 2020-12-14 ENCOUNTER — Other Ambulatory Visit: Payer: BC Managed Care – PPO

## 2020-12-19 ENCOUNTER — Encounter: Payer: Self-pay | Admitting: Orthopedic Surgery

## 2020-12-19 ENCOUNTER — Encounter
Admission: RE | Disposition: A | Discharge status: Home / Self Care | Payer: Self-pay | Source: Home / Self Care | Attending: Orthopedic Surgery

## 2020-12-19 ENCOUNTER — Ambulatory Visit: Payer: BC Managed Care – PPO

## 2020-12-19 ENCOUNTER — Observation Stay
Admission: RE | Admit: 2020-12-19 | Discharge: 2020-12-20 | Disposition: A | Discharge status: Home / Self Care | Payer: BC Managed Care – PPO | Attending: Orthopedic Surgery | Admitting: Orthopedic Surgery

## 2020-12-19 ENCOUNTER — Other Ambulatory Visit: Payer: Self-pay

## 2020-12-19 DIAGNOSIS — E119 Type 2 diabetes mellitus without complications: Secondary | ICD-10-CM | POA: Diagnosis not present

## 2020-12-19 DIAGNOSIS — Z96641 Presence of right artificial hip joint: Secondary | ICD-10-CM

## 2020-12-19 DIAGNOSIS — Z7984 Long term (current) use of oral hypoglycemic drugs: Secondary | ICD-10-CM | POA: Insufficient documentation

## 2020-12-19 DIAGNOSIS — Z20822 Contact with and (suspected) exposure to covid-19: Secondary | ICD-10-CM | POA: Diagnosis not present

## 2020-12-19 DIAGNOSIS — M1611 Unilateral primary osteoarthritis, right hip: Secondary | ICD-10-CM | POA: Diagnosis not present

## 2020-12-19 DIAGNOSIS — Z8679 Personal history of other diseases of the circulatory system: Secondary | ICD-10-CM | POA: Insufficient documentation

## 2020-12-19 DIAGNOSIS — E669 Obesity, unspecified: Secondary | ICD-10-CM | POA: Insufficient documentation

## 2020-12-19 DIAGNOSIS — Z79899 Other long term (current) drug therapy: Secondary | ICD-10-CM | POA: Insufficient documentation

## 2020-12-19 DIAGNOSIS — Z85828 Personal history of other malignant neoplasm of skin: Secondary | ICD-10-CM | POA: Diagnosis not present

## 2020-12-19 DIAGNOSIS — Z419 Encounter for procedure for purposes other than remedying health state, unspecified: Secondary | ICD-10-CM

## 2020-12-19 HISTORY — PX: TOTAL HIP ARTHROPLASTY: SHX124

## 2020-12-19 LAB — GLUCOSE, CAPILLARY
Glucose-Capillary: 180 mg/dL — ABNORMAL HIGH (ref 70–99)
Glucose-Capillary: 233 mg/dL — ABNORMAL HIGH (ref 70–99)
Glucose-Capillary: 244 mg/dL — ABNORMAL HIGH (ref 70–99)

## 2020-12-19 LAB — SARS CORONAVIRUS 2 BY RT PCR (HOSPITAL ORDER, PERFORMED IN ~~LOC~~ HOSPITAL LAB): SARS Coronavirus 2: NEGATIVE

## 2020-12-19 SURGERY — ARTHROPLASTY, HIP, TOTAL, ANTERIOR APPROACH
Anesthesia: General | Site: Hip | Laterality: Right

## 2020-12-19 MED ORDER — HYDROMORPHONE HCL 1 MG/ML IJ SOLN
INTRAMUSCULAR | Status: AC
  Filled 2020-12-19: qty 1

## 2020-12-19 MED ORDER — PRAVASTATIN SODIUM 20 MG PO TABS
10.0000 mg | ORAL_TABLET | Freq: Every day | ORAL | Status: DC
  Administered 2020-12-19: 22:00:00 10 mg via ORAL
  Filled 2020-12-19: qty 1

## 2020-12-19 MED ORDER — MIDAZOLAM HCL 2 MG/2ML IJ SOLN
INTRAMUSCULAR | Status: DC | PRN
  Administered 2020-12-19: 2 mg via INTRAVENOUS

## 2020-12-19 MED ORDER — PHENYLEPHRINE HCL-NACL 20-0.9 MG/250ML-% IV SOLN
INTRAVENOUS | Status: DC | PRN
  Administered 2020-12-19: 40 ug/min via INTRAVENOUS

## 2020-12-19 MED ORDER — INSULIN ASPART 100 UNIT/ML IJ SOLN
INTRAMUSCULAR | Status: AC
  Filled 2020-12-19: qty 1

## 2020-12-19 MED ORDER — DEXMEDETOMIDINE (PRECEDEX) IN NS 20 MCG/5ML (4 MCG/ML) IV SYRINGE
PREFILLED_SYRINGE | INTRAVENOUS | Status: DC | PRN
  Administered 2020-12-19: 4 ug via INTRAVENOUS
  Administered 2020-12-19: 16 ug via INTRAVENOUS

## 2020-12-19 MED ORDER — ROCURONIUM BROMIDE 100 MG/10ML IV SOLN
INTRAVENOUS | Status: DC | PRN
Start: 2020-12-19 — End: 2020-12-19
  Administered 2020-12-19: 40 mg via INTRAVENOUS
  Administered 2020-12-19: 20 mg via INTRAVENOUS

## 2020-12-19 MED ORDER — FLUTICASONE PROPIONATE 50 MCG/ACT NA SUSP
2.0000 | Freq: Two times a day (BID) | NASAL | Status: DC
  Filled 2020-12-19: qty 16

## 2020-12-19 MED ORDER — LACTATED RINGERS IV SOLN: INTRAVENOUS | Status: DC

## 2020-12-19 MED ORDER — DAPAGLIFLOZIN PROPANEDIOL 5 MG PO TABS
10.0000 mg | ORAL_TABLET | Freq: Every day | ORAL | Status: DC
  Administered 2020-12-20: 10 mg via ORAL
  Filled 2020-12-19: qty 2
  Filled 2020-12-19 (×2): qty 1

## 2020-12-19 MED ORDER — TRANEXAMIC ACID-NACL 1000-0.7 MG/100ML-% IV SOLN
1000.0000 mg | INTRAVENOUS | Status: AC
  Administered 2020-12-19: 13:00:00 1000 mg via INTRAVENOUS

## 2020-12-19 MED ORDER — PANTOPRAZOLE SODIUM 40 MG PO TBEC
40.0000 mg | DELAYED_RELEASE_TABLET | Freq: Every day | ORAL | Status: DC
  Administered 2020-12-20: 40 mg via ORAL
  Filled 2020-12-19: qty 1

## 2020-12-19 MED ORDER — ONDANSETRON HCL 4 MG/2ML IJ SOLN
4.0000 mg | Freq: Four times a day (QID) | INTRAMUSCULAR | Status: DC | PRN
  Administered 2020-12-19: 19:00:00 4 mg via INTRAVENOUS
  Filled 2020-12-19: qty 2

## 2020-12-19 MED ORDER — SODIUM CHLORIDE 0.9 % IR SOLN
Status: DC | PRN
  Administered 2020-12-19: 3000 mL

## 2020-12-19 MED ORDER — MENTHOL 3 MG MT LOZG
1.0000 | LOZENGE | OROMUCOSAL | Status: DC | PRN
  Filled 2020-12-19: qty 9

## 2020-12-19 MED ORDER — PRONTOSAN WOUND IRRIGATION OPTIME
TOPICAL | Status: DC | PRN
  Administered 2020-12-19: 1 via TOPICAL

## 2020-12-19 MED ORDER — HYDROCODONE-ACETAMINOPHEN 5-325 MG PO TABS
1.0000 | ORAL_TABLET | ORAL | Status: DC | PRN
  Administered 2020-12-19: 2 via ORAL
  Filled 2020-12-19 (×2): qty 2

## 2020-12-19 MED ORDER — SODIUM CHLORIDE 0.9 % IV SOLN: INTRAVENOUS | Status: DC

## 2020-12-19 MED ORDER — ACETAMINOPHEN 325 MG PO TABS: 325.0000 mg | ORAL_TABLET | Freq: Four times a day (QID) | ORAL | Status: DC | PRN

## 2020-12-19 MED ORDER — METOCLOPRAMIDE HCL 10 MG PO TABS: 5.0000 mg | ORAL_TABLET | Freq: Three times a day (TID) | ORAL | Status: DC | PRN

## 2020-12-19 MED ORDER — PROPOFOL 10 MG/ML IV BOLUS
INTRAVENOUS | Status: DC | PRN
  Administered 2020-12-19: 200 mg via INTRAVENOUS

## 2020-12-19 MED ORDER — HYDROCHLOROTHIAZIDE 12.5 MG PO TABS
12.5000 mg | ORAL_TABLET | Freq: Every day | ORAL | Status: DC
  Administered 2020-12-20: 12.5 mg via ORAL
  Filled 2020-12-19 (×2): qty 1

## 2020-12-19 MED ORDER — LISINOPRIL 20 MG PO TABS
40.0000 mg | ORAL_TABLET | Freq: Every day | ORAL | Status: DC
  Administered 2020-12-20: 40 mg via ORAL
  Filled 2020-12-19: qty 2

## 2020-12-19 MED ORDER — ONDANSETRON HCL 4 MG/2ML IJ SOLN
INTRAMUSCULAR | Status: DC | PRN
  Administered 2020-12-19: 4 mg via INTRAVENOUS

## 2020-12-19 MED ORDER — ALUM & MAG HYDROXIDE-SIMETH 200-200-20 MG/5ML PO SUSP
30.0000 mL | ORAL | Status: DC | PRN
  Administered 2020-12-19: 21:00:00 30 mL via ORAL
  Filled 2020-12-19: qty 30

## 2020-12-19 MED ORDER — BUPIVACAINE LIPOSOME 1.3 % IJ SUSP
INTRAMUSCULAR | Status: AC
  Filled 2020-12-19: qty 20

## 2020-12-19 MED ORDER — EPHEDRINE 5 MG/ML INJ
INTRAVENOUS | Status: AC
  Filled 2020-12-19: qty 5

## 2020-12-19 MED ORDER — GLYCOPYRROLATE 0.2 MG/ML IJ SOLN
INTRAMUSCULAR | Status: DC | PRN
  Administered 2020-12-19: .2 mg via INTRAVENOUS

## 2020-12-19 MED ORDER — CHLORHEXIDINE GLUCONATE 0.12 % MT SOLN
OROMUCOSAL | Status: AC
  Administered 2020-12-19: 09:00:00 15 mL via OROMUCOSAL
  Filled 2020-12-19: qty 15

## 2020-12-19 MED ORDER — TRANEXAMIC ACID-NACL 1000-0.7 MG/100ML-% IV SOLN
INTRAVENOUS | Status: AC
  Filled 2020-12-19: qty 100

## 2020-12-19 MED ORDER — PHENYLEPHRINE HCL (PRESSORS) 10 MG/ML IV SOLN
INTRAVENOUS | Status: DC | PRN
Start: 2020-12-19 — End: 2020-12-19
  Administered 2020-12-19: 40 ug via INTRAVENOUS
  Administered 2020-12-19: 160 ug via INTRAVENOUS
  Administered 2020-12-19 (×3): 80 ug via INTRAVENOUS

## 2020-12-19 MED ORDER — KETOROLAC TROMETHAMINE 15 MG/ML IJ SOLN
15.0000 mg | Freq: Four times a day (QID) | INTRAMUSCULAR | Status: DC
  Administered 2020-12-19 – 2020-12-20 (×3): 15 mg via INTRAVENOUS
  Filled 2020-12-19 (×4): qty 1

## 2020-12-19 MED ORDER — ORAL CARE MOUTH RINSE: 15.0000 mL | Freq: Once | OROMUCOSAL | Status: AC

## 2020-12-19 MED ORDER — MAGNESIUM HYDROXIDE 400 MG/5ML PO SUSP: 30.0000 mL | Freq: Every day | ORAL | Status: DC | PRN

## 2020-12-19 MED ORDER — METOCLOPRAMIDE HCL 5 MG/ML IJ SOLN: 5.0000 mg | Freq: Three times a day (TID) | INTRAMUSCULAR | Status: DC | PRN

## 2020-12-19 MED ORDER — FENTANYL CITRATE (PF) 100 MCG/2ML IJ SOLN
INTRAMUSCULAR | Status: AC
  Administered 2020-12-19: 15:00:00 25 ug via INTRAVENOUS
  Filled 2020-12-19: qty 2

## 2020-12-19 MED ORDER — PHENOL 1.4 % MT LIQD
1.0000 | OROMUCOSAL | Status: DC | PRN
  Filled 2020-12-19: qty 177

## 2020-12-19 MED ORDER — CHLORHEXIDINE GLUCONATE 0.12 % MT SOLN: 15.0000 mL | Freq: Once | OROMUCOSAL | Status: AC

## 2020-12-19 MED ORDER — CYANOCOBALAMIN 1000 MCG/ML IJ SOLN: 1000.0000 ug | INTRAMUSCULAR | Status: DC

## 2020-12-19 MED ORDER — BISACODYL 10 MG RE SUPP: 10.0000 mg | Freq: Every day | RECTAL | Status: DC | PRN

## 2020-12-19 MED ORDER — HYDROCODONE-ACETAMINOPHEN 7.5-325 MG PO TABS
1.0000 | ORAL_TABLET | ORAL | Status: DC | PRN
  Administered 2020-12-20 (×2): 2 via ORAL
  Filled 2020-12-19 (×2): qty 2

## 2020-12-19 MED ORDER — ONDANSETRON HCL 4 MG/2ML IJ SOLN: 4.0000 mg | Freq: Once | INTRAMUSCULAR | Status: DC | PRN

## 2020-12-19 MED ORDER — AMLODIPINE BESYLATE 10 MG PO TABS
10.0000 mg | ORAL_TABLET | ORAL | Status: DC
  Administered 2020-12-20: 10 mg via ORAL
  Filled 2020-12-19: qty 1

## 2020-12-19 MED ORDER — 0.9 % SODIUM CHLORIDE (POUR BTL) OPTIME
TOPICAL | Status: DC | PRN
  Administered 2020-12-19: 14:00:00 1000 mL

## 2020-12-19 MED ORDER — INSULIN ASPART 100 UNIT/ML IJ SOLN
3.0000 [IU] | Freq: Once | INTRAMUSCULAR | Status: AC
  Administered 2020-12-19: 15:00:00 3 [IU] via SUBCUTANEOUS

## 2020-12-19 MED ORDER — MORPHINE SULFATE (PF) 2 MG/ML IV SOLN: 0.5000 mg | INTRAVENOUS | Status: DC | PRN

## 2020-12-19 MED ORDER — PROPOFOL 10 MG/ML IV BOLUS
INTRAVENOUS | Status: AC
  Filled 2020-12-19: qty 20

## 2020-12-19 MED ORDER — ASPIRIN 81 MG PO CHEW
81.0000 mg | CHEWABLE_TABLET | Freq: Two times a day (BID) | ORAL | Status: DC
  Administered 2020-12-19 – 2020-12-20 (×2): 81 mg via ORAL
  Filled 2020-12-19 (×2): qty 1

## 2020-12-19 MED ORDER — SUGAMMADEX SODIUM 200 MG/2ML IV SOLN
INTRAVENOUS | Status: DC | PRN
  Administered 2020-12-19: 200 mg via INTRAVENOUS

## 2020-12-19 MED ORDER — NEOMYCIN-POLYMYXIN B GU 40-200000 IR SOLN
Status: DC | PRN
  Administered 2020-12-19: 14 mL

## 2020-12-19 MED ORDER — METFORMIN HCL ER 500 MG PO TB24
500.0000 mg | ORAL_TABLET | Freq: Every day | ORAL | Status: DC
  Filled 2020-12-19: qty 1

## 2020-12-19 MED ORDER — LIDOCAINE HCL (CARDIAC) PF 100 MG/5ML IV SOSY
PREFILLED_SYRINGE | INTRAVENOUS | Status: DC | PRN
  Administered 2020-12-19: 100 mg via INTRAVENOUS

## 2020-12-19 MED ORDER — ONDANSETRON HCL 4 MG PO TABS: 4.0000 mg | ORAL_TABLET | Freq: Four times a day (QID) | ORAL | Status: DC | PRN

## 2020-12-19 MED ORDER — ACETAMINOPHEN 10 MG/ML IV SOLN
INTRAVENOUS | Status: AC
  Filled 2020-12-19: qty 100

## 2020-12-19 MED ORDER — ACETAMINOPHEN 10 MG/ML IV SOLN
INTRAVENOUS | Status: DC | PRN
  Administered 2020-12-19: 1000 mg via INTRAVENOUS

## 2020-12-19 MED ORDER — MIDAZOLAM HCL 2 MG/2ML IJ SOLN
INTRAMUSCULAR | Status: AC
  Filled 2020-12-19: qty 2

## 2020-12-19 MED ORDER — HYDROMORPHONE HCL 1 MG/ML IJ SOLN
INTRAMUSCULAR | Status: AC
  Administered 2020-12-19: 16:00:00 0.5 mg via INTRAVENOUS
  Filled 2020-12-19: qty 1

## 2020-12-19 MED ORDER — TRAZODONE HCL 50 MG PO TABS
50.0000 mg | ORAL_TABLET | Freq: Every day | ORAL | Status: DC
  Administered 2020-12-19: 21:00:00 50 mg via ORAL
  Filled 2020-12-19: qty 1

## 2020-12-19 MED ORDER — HYDROMORPHONE HCL 1 MG/ML IJ SOLN
0.5000 mg | INTRAMUSCULAR | Status: AC | PRN
  Administered 2020-12-19 (×2): 0.5 mg via INTRAVENOUS

## 2020-12-19 MED ORDER — DEXAMETHASONE SODIUM PHOSPHATE 10 MG/ML IJ SOLN
INTRAMUSCULAR | Status: DC | PRN
  Administered 2020-12-19: 10 mg via INTRAVENOUS

## 2020-12-19 MED ORDER — BUPIVACAINE-EPINEPHRINE (PF) 0.25% -1:200000 IJ SOLN
INTRAMUSCULAR | Status: DC | PRN
  Administered 2020-12-19: 30 mL

## 2020-12-19 MED ORDER — TESTOSTERONE CYPIONATE 200 MG/ML IM SOLN: 100.0000 mg | INTRAMUSCULAR | Status: DC

## 2020-12-19 MED ORDER — POVIDONE-IODINE 10 % EX SWAB
2.0000 "application " | Freq: Once | CUTANEOUS | Status: AC
  Administered 2020-12-19: 2 via TOPICAL

## 2020-12-19 MED ORDER — METOPROLOL SUCCINATE ER 50 MG PO TB24
100.0000 mg | ORAL_TABLET | Freq: Every day | ORAL | Status: DC
  Administered 2020-12-20: 100 mg via ORAL
  Filled 2020-12-19: qty 2

## 2020-12-19 MED ORDER — FENTANYL CITRATE (PF) 100 MCG/2ML IJ SOLN
INTRAMUSCULAR | Status: AC
  Filled 2020-12-19: qty 2

## 2020-12-19 MED ORDER — FENTANYL CITRATE (PF) 100 MCG/2ML IJ SOLN
25.0000 ug | INTRAMUSCULAR | Status: AC | PRN
  Administered 2020-12-19 (×6): 25 ug via INTRAVENOUS

## 2020-12-19 MED ORDER — HYDROMORPHONE HCL 1 MG/ML IJ SOLN
INTRAMUSCULAR | Status: DC | PRN
  Administered 2020-12-19 (×2): .5 mg via INTRAVENOUS

## 2020-12-19 MED ORDER — CEFAZOLIN SODIUM-DEXTROSE 2-4 GM/100ML-% IV SOLN
2.0000 g | INTRAVENOUS | Status: AC
  Administered 2020-12-19: 13:00:00 3 g via INTRAVENOUS

## 2020-12-19 MED ORDER — SUCCINYLCHOLINE CHLORIDE 200 MG/10ML IV SOSY
PREFILLED_SYRINGE | INTRAVENOUS | Status: DC | PRN
  Administered 2020-12-19: 120 mg via INTRAVENOUS

## 2020-12-19 MED ORDER — CEFAZOLIN SODIUM-DEXTROSE 2-4 GM/100ML-% IV SOLN
INTRAVENOUS | Status: AC
  Filled 2020-12-19: qty 100

## 2020-12-19 MED ORDER — FENTANYL CITRATE (PF) 100 MCG/2ML IJ SOLN
INTRAMUSCULAR | Status: DC | PRN
  Administered 2020-12-19: 50 ug via INTRAVENOUS
  Administered 2020-12-19: 100 ug via INTRAVENOUS
  Administered 2020-12-19: 50 ug via INTRAVENOUS

## 2020-12-19 MED ORDER — CEFAZOLIN SODIUM-DEXTROSE 2-4 GM/100ML-% IV SOLN
2.0000 g | Freq: Four times a day (QID) | INTRAVENOUS | Status: AC
  Administered 2020-12-19 (×2): 2 g via INTRAVENOUS
  Filled 2020-12-19 (×2): qty 100

## 2020-12-19 MED ORDER — EPHEDRINE SULFATE 50 MG/ML IJ SOLN
INTRAMUSCULAR | Status: DC | PRN
  Administered 2020-12-19 (×3): 5 mg via INTRAVENOUS

## 2020-12-19 MED ORDER — DOCUSATE SODIUM 100 MG PO CAPS
100.0000 mg | ORAL_CAPSULE | Freq: Two times a day (BID) | ORAL | Status: DC
  Administered 2020-12-19 – 2020-12-20 (×2): 100 mg via ORAL
  Filled 2020-12-19 (×2): qty 1

## 2020-12-19 MED ORDER — ALBUTEROL SULFATE HFA 108 (90 BASE) MCG/ACT IN AERS
INHALATION_SPRAY | RESPIRATORY_TRACT | Status: DC | PRN
  Administered 2020-12-19: 4 via RESPIRATORY_TRACT

## 2020-12-19 SURGICAL SUPPLY — 50 items
BLADE SAGITTAL WIDE XTHICK NO (BLADE) ×2 IMPLANT
BRUSH SCRUB EZ  4% CHG (MISCELLANEOUS) ×4
BRUSH SCRUB EZ 4% CHG (MISCELLANEOUS) ×2 IMPLANT
CHLORAPREP W/TINT 26 (MISCELLANEOUS) ×2 IMPLANT
COVER HOLE (Hips) ×2 IMPLANT
DRAPE 3/4 80X56 (DRAPES) ×2 IMPLANT
DRAPE C-ARM 42X72 X-RAY (DRAPES) ×2 IMPLANT
DRAPE STERI IOBAN 125X83 (DRAPES) IMPLANT
DRSG AQUACEL AG ADV 3.5X10 (GAUZE/BANDAGES/DRESSINGS) ×2 IMPLANT
ELECT REM PT RETURN 9FT ADLT (ELECTROSURGICAL) ×2
ELECTRODE REM PT RTRN 9FT ADLT (ELECTROSURGICAL) ×1 IMPLANT
GAUZE 4X4 16PLY ~~LOC~~+RFID DBL (SPONGE) ×2 IMPLANT
GAUZE XEROFORM 1X8 LF (GAUZE/BANDAGES/DRESSINGS) ×2 IMPLANT
GLOVE SURG ORTHO LTX SZ8 (GLOVE) ×4 IMPLANT
GLOVE SURG UNDER LTX SZ8 (GLOVE) ×2 IMPLANT
GOWN STRL REUS W/ TWL LRG LVL3 (GOWN DISPOSABLE) ×1 IMPLANT
GOWN STRL REUS W/ TWL XL LVL3 (GOWN DISPOSABLE) ×1 IMPLANT
GOWN STRL REUS W/TWL LRG LVL3 (GOWN DISPOSABLE) ×2
GOWN STRL REUS W/TWL XL LVL3 (GOWN DISPOSABLE) ×2
HEAD FEMORAL TAPER 36MM P0 (Head) ×2 IMPLANT
IRRIGATION SURGIPHOR STRL (IV SOLUTION) ×1 IMPLANT
IV NS 1000ML (IV SOLUTION) ×2
IV NS 1000ML BAXH (IV SOLUTION) ×1 IMPLANT
KIT PATIENT CARE HANA TABLE (KITS) ×2 IMPLANT
KIT TURNOVER CYSTO (KITS) ×2 IMPLANT
LINER ACETABULAR 36X56 OD (Liner) ×2 IMPLANT
MANIFOLD NEPTUNE II (INSTRUMENTS) ×2 IMPLANT
MAT ABSORB  FLUID 56X50 GRAY (MISCELLANEOUS) ×2
MAT ABSORB FLUID 56X50 GRAY (MISCELLANEOUS) ×1 IMPLANT
NEEDLE HYPO 22GX1.5 SAFETY (NEEDLE) ×2 IMPLANT
NEEDLE SPNL 20GX3.5 QUINCKE YW (NEEDLE) ×2 IMPLANT
PACK HIP PROSTHESIS (MISCELLANEOUS) ×2 IMPLANT
PADDING CAST BLEND 4X4 NS (MISCELLANEOUS) ×4 IMPLANT
PILLOW ABDUCTION MEDIUM (MISCELLANEOUS) ×2 IMPLANT
PULSAVAC PLUS IRRIG FAN TIP (DISPOSABLE) ×2
SCREW 6.5X25MM (Screw) ×2 IMPLANT
SHELL USE W/LINR OD 56MM (Shell) ×2 IMPLANT
SOLUTION PRONTOSAN WOUND 350ML (IRRIGATION / IRRIGATOR) ×2 IMPLANT
SPONGE T-LAP 18X18 ~~LOC~~+RFID (SPONGE) ×8 IMPLANT
STAPLER SKIN PROX 35W (STAPLE) ×2 IMPLANT
STEM LATERAL W/COLLAR SZ LAT6 (Stem) ×2 IMPLANT
SUT BONE WAX W31G (SUTURE) ×2 IMPLANT
SUT DVC 2 QUILL PDO  T11 36X36 (SUTURE) ×2
SUT DVC 2 QUILL PDO T11 36X36 (SUTURE) ×1 IMPLANT
SUT VIC AB 2-0 CT1 18 (SUTURE) ×2 IMPLANT
SYR 20ML LL LF (SYRINGE) ×2 IMPLANT
TIP FAN IRRIG PULSAVAC PLUS (DISPOSABLE) ×1 IMPLANT
WAND WEREWOLF FASTSEAL 6.0 (MISCELLANEOUS) ×2 IMPLANT
WATER STERILE IRR 1000ML POUR (IV SOLUTION) ×4 IMPLANT
WATER STERILE IRR 500ML POUR (IV SOLUTION) ×2 IMPLANT

## 2020-12-19 NOTE — Op Note (Signed)
12/19/2020  2:48 PM  PATIENT:  Mike Patel   MRN: [id number]  PRE-OPERATIVE DIAGNOSIS:  Osteoarthritis right hip   POST-OPERATIVE DIAGNOSIS: Same  Procedure: Right Total Hip Replacement  Surgeon: Elyn Aquas. Harlow Mares, MD   Assist: Carlynn Spry, PA-C  Anesthesia: Spinal   EBL: 200 mL   Specimens: None   Drains: None   Components used: A size 6 lateral Polarstem [employer]hew, R3 size 56 mm shell, and a 36 mm +0 mm head    Description of the procedure in detail: After informed consent was obtained and the appropriate extremity marked in the pre-operative holding area, the patient was taken to the operating room and placed in the supine position on the fracture table. All pressure points were well padded and bilateral lower extremities were place in traction spars. The hip was prepped and draped in standard sterile fashion. A spinal anesthetic had been delivered by the anesthesia team. The skin and subcutaneous tissues were injected with a mixture of Marcaine with epinephrine for post-operative pain. A longitudinal incision approximately 10 cm in length was carried out from the anterior superior iliac spine to the greater trochanter. The tensor fascia was divided and blunt dissection was taken down to the level of the joint capsule. The lateral circumflex vessels were cauterized. Deep retractors were placed and a portion of the anterior capsule was excised. Using fluoroscopy the neck cut was planned and carried out with a sagittal saw. The head was passed from the field with use of a corkscrew and hip skid. Deep retractors were placed along the acetabulum and the degenerative labrum and large osteophytes were removed with a Rongeur. The cup was sequentially reamed to a size 56 mm. The wound was irrigated and using fluoroscopy the size 56 mm cup was impacted in to anatomic position. A single screw was placed followed by a threaded hole cover. The final liner was impacted in to position.  Attention was then turned to the proximal femur. The leg was placed in extension and external rotation. The canal was opened and sequentially broached to a size 6 lateral. The trial components were placed and the hip relocated. The components were found to be in good position using fluoroscopy. The hip was dislocated and the trial components removed. The final components were impacted in to position and the hip relocated. The final components were again check with fluoroscopy and found to be in good position. Hemostasis was achieved with electrocautery. The deep capsule was injected with Marcaine and epinephrine. The wound was irrigated with bacitracin laced normal saline and the tensor fascia closed with #2 Quill suture. The subcutaneous tissues were closed with 2-0 vicryl and staples for the skin. A sterile dressing was applied and an abduction pillow. Patient tolerated the procedure well and there were no apparent complication. Patient was taken to the recovery room in good condition.   Kurtis Bushman, MD

## 2020-12-19 NOTE — Transfer of Care (Signed)
Immediate Anesthesia Transfer of Care Note  Patient: Mike Patel  Procedure(s) Performed: TOTAL HIP ARTHROPLASTY ANTERIOR APPROACH (Right: Hip)  Patient Location: PACU  Anesthesia Type:General  Level of Consciousness: awake, alert  and oriented  Airway & Oxygen Therapy: Patient Spontanous Breathing  Post-op Assessment: Report given to RN and Post -op Vital signs reviewed and stable  Post vital signs: Reviewed and stable  Last Vitals:  Vitals Value Taken Time  BP    Temp    Pulse    Resp    SpO2      Last Pain:  Vitals:   12/19/20 0917  TempSrc: Temporal  PainSc: 5          Complications: No notable events documented.

## 2020-12-19 NOTE — Anesthesia Procedure Notes (Addendum)
Procedure Name: Intubation Date/Time: 12/19/2020 12:40 PM Performed by: Chanetta Marshall, CRNA Pre-anesthesia Checklist: Patient identified, Emergency Drugs available, Suction available and Patient being monitored Patient Re-evaluated:Patient Re-evaluated prior to induction Oxygen Delivery Method: Circle system utilized Preoxygenation: Pre-oxygenation with 100% oxygen Induction Type: IV induction Ventilation: Mask ventilation without difficulty Laryngoscope Size: McGraph and 3 Grade View: Grade I Tube type: Oral Number of attempts: 1 Airway Equipment and Method: Stylet, Oral airway and Video-laryngoscopy Placement Confirmation: ETT inserted through vocal cords under direct vision, positive ETCO2, breath sounds checked- equal and bilateral and CO2 detector Secured at: 22 cm Tube secured with: Tape Dental Injury: Teeth and Oropharynx as per pre-operative assessment

## 2020-12-19 NOTE — Plan of Care (Signed)
  Problem: Education: Goal: Knowledge of General Education information will improve Description: Including pain rating scale, medication(s)/side effects and non-pharmacologic comfort measures Outcome: Progressing   Problem: Health Behavior/Discharge Planning: Goal: Ability to manage health-related needs will improve Outcome: Progressing   Problem: Clinical Measurements: Goal: Ability to maintain clinical measurements within normal limits will improve Outcome: Progressing Goal: Will remain free from infection Outcome: Progressing Goal: Diagnostic test results will improve Outcome: Progressing Goal: Respiratory complications will improve Outcome: Progressing Goal: Cardiovascular complication will be avoided Outcome: Progressing   Problem: Activity: Goal: Risk for activity intolerance will decrease Outcome: Progressing   Problem: Nutrition: Goal: Adequate nutrition will be maintained Outcome: Progressing   Problem: Coping: Goal: Level of anxiety will decrease Outcome: Progressing   Problem: Elimination: Goal: Will not experience complications related to bowel motility Outcome: Progressing Goal: Will not experience complications related to urinary retention Outcome: Progressing   Problem: Pain Managment: Goal: General experience of comfort will improve Outcome: Progressing   Problem: Safety: Goal: Ability to remain free from injury will improve Outcome: Progressing   Problem: Clinical Measurements: Goal: Postoperative complications will be avoided or minimized Outcome: Progressing

## 2020-12-19 NOTE — Anesthesia Preprocedure Evaluation (Addendum)
Anesthesia Evaluation  Patient identified by MRN, date of birth, ID band Patient awake    Reviewed: Allergy & Precautions, H&P , NPO status , Patient's Chart, lab work & pertinent test results, reviewed documented beta blocker date and time   Airway Mallampati: III   Neck ROM: full    Dental  (+) Poor Dentition   Pulmonary sleep apnea ,    Pulmonary exam normal        Cardiovascular Exercise Tolerance: Good METS: hypertension, Pt. on medications negative cardio ROS Normal cardiovascular exam Rhythm:regular Rate:Normal     Neuro/Psych negative neurological ROS  negative psych ROS   GI/Hepatic Neg liver ROS, GERD  Medicated,  Endo/Other  negative endocrine ROSdiabetes, Well Controlled, Oral Hypoglycemic Agents  Renal/GU negative Renal ROS  negative genitourinary   Musculoskeletal   Abdominal   Peds  Hematology negative hematology ROS (+)   Anesthesia Other Findings Past Medical History: No date: Aortic atherosclerosis ([place]) No date: Arthritis     Comment:  BACK 05/19/2019: Basal cell carcinoma     Comment:  Right lower lip. Nodular and infiltrative. No date: DDD (degenerative disc disease), cervical No date: Diabetes mellitus without complication ([place]) 40/98/1191: Dysplastic nevus     Comment:  Left mid back. Moderate atypia, limited margins free. 07/14/2019: Dysplastic nevus     Comment:  Right spinal mid back. Moderate atypia, deep margin               involved. 07/14/2019: Dysplastic nevus     Comment:  Right upper arm. Moderate atypia, deep margin involved.  No date: GERD (gastroesophageal reflux disease) No date: Gout No date: Hypertension No date: Melanemia 06/29/2019: Melanoma ([place])     Comment:  0.53mm Level II,  spinal mid back WLE, [place] No date: Obesity No date: Pre-diabetes No date: Sleep apnea     Comment:  cpap Past Surgical History: 2018: BACK SURGERY     Comment:  x3 12/02/2017:  CHOLECYSTECTOMY; N/A     Comment:  Procedure: LAPAROSCOPIC CHOLECYSTECTOMY WITH               INTRAOPERATIVE CHOLANGIOGRAM;  Surgeon: Olean Ree,               MD;  Location: [facility] ORS;  Service: General;  Laterality:               N/A; 05/24/2014: COLONOSCOPY; N/A     Comment:  Procedure: COLONOSCOPY;  Surgeon: Hulen Luster, MD;                Location: [facility] ENDOSCOPY;  Service: Gastroenterology;                Laterality: N/A; 05/30/2020: COLONOSCOPY WITH PROPOFOL; N/A     Comment:  Procedure: COLONOSCOPY WITH PROPOFOL;  Surgeon: [place],               Benay Pike, MD;  Location: [facility] ENDOSCOPY;  Service:               Gastroenterology;  Laterality: N/A; No date: ELBOW SURGERY; Right     Comment:  tennis elbow 11/18/2017: ERCP; N/A     Comment:  Procedure: ENDOSCOPIC RETROGRADE               CHOLANGIOPANCREATOGRAPHY (ERCP);  Surgeon: Lucilla Lame,               MD;  Location: [facility] ENDOSCOPY;  Service: Endoscopy;  Laterality: N/A; 05/24/2014: ESOPHAGOGASTRODUODENOSCOPY; N/A     Comment:  Procedure: ESOPHAGOGASTRODUODENOSCOPY (EGD);  Surgeon:               Hulen Luster, MD;  Location: [facility] ENDOSCOPY;  Service:               Gastroenterology;  Laterality: N/A; 2015: PROSTATE SURGERY No date: SPINE SURGERY     Comment:  l-spine x 2 BMI    Body Mass Index: 43.76 kg/m     Reproductive/Obstetrics negative OB ROS                            Anesthesia Physical Anesthesia Plan  ASA: 3  Anesthesia Plan: General   Post-op Pain Management:    Induction: Intravenous and Rapid sequence  PONV Risk Score and Plan:   Airway Management Planned: Oral ETT and Video Laryngoscope Planned  Additional Equipment:   Intra-op Plan:   Post-operative Plan:   Informed Consent: I have reviewed the patients History and Physical, chart, labs and discussed the procedure including the risks, benefits and alternatives for the proposed anesthesia with the patient or  authorized representative who has indicated his/her understanding and acceptance.     Dental Advisory Given  Plan Discussed with: CRNA  Anesthesia Plan Comments: (Risks and benefits of RA and GA discussed with patient and secondary to previous back surgeries, we have chosen to proceed with GOT.  ja)       Anesthesia Quick Evaluation

## 2020-12-19 NOTE — H&P (Signed)
The patient has been re-examined, and the chart reviewed, and there have been no interval changes to the documented history and physical.  Plan a right total hip replacement today.  Anesthesia is not consulted regarding a peripheral nerve block for post-operative pain.  The risks, benefits, and alternatives have been discussed at length, and the patient is willing to proceed.     

## 2020-12-19 NOTE — Anesthesia Procedure Notes (Deleted)
Procedure Name: Intubation Date/Time: 12/19/2020 12:40 PM Performed by: Justin Mend, RN Pre-anesthesia Checklist: Patient identified, Emergency Drugs available, Suction available and Patient being monitored Patient Re-evaluated:Patient Re-evaluated prior to induction Oxygen Delivery Method: Circle system utilized Preoxygenation: Pre-oxygenation with 100% oxygen Induction Type: IV induction Ventilation: Mask ventilation without difficulty Laryngoscope Size: McGraph and 3 Grade View: Grade I Tube type: Oral Tube size: 7.5 mm Number of attempts: 1 Airway Equipment and Method: Stylet and Oral airway Placement Confirmation: ETT inserted through vocal cords under direct vision, positive ETCO2 and breath sounds checked- equal and bilateral Secured at: 23 cm Tube secured with: Tape Dental Injury: Teeth and Oropharynx as per pre-operative assessment

## 2020-12-20 ENCOUNTER — Encounter: Payer: Self-pay | Admitting: Orthopedic Surgery

## 2020-12-20 DIAGNOSIS — M1611 Unilateral primary osteoarthritis, right hip: Secondary | ICD-10-CM | POA: Diagnosis not present

## 2020-12-20 LAB — CBC
HCT: 48.8 % (ref 39.0–52.0)
Hemoglobin: 16.1 g/dL (ref 13.0–17.0)
MCH: 30.4 pg (ref 26.0–34.0)
MCHC: 33 g/dL (ref 30.0–36.0)
MCV: 92.2 fL (ref 80.0–100.0)
Platelets: 237 10*3/uL (ref 150–400)
RBC: 5.29 MIL/uL (ref 4.22–5.81)
RDW: 13 % (ref 11.5–15.5)
WBC: 14.1 10*3/uL — ABNORMAL HIGH (ref 4.0–10.5)
nRBC: 0 % (ref 0.0–0.2)

## 2020-12-20 LAB — BASIC METABOLIC PANEL
Anion gap: 9 (ref 5–15)
BUN: 15 mg/dL (ref 8–23)
CO2: 26 mmol/L (ref 22–32)
Calcium: 8.6 mg/dL — ABNORMAL LOW (ref 8.9–10.3)
Chloride: 97 mmol/L — ABNORMAL LOW (ref 98–111)
Creatinine, Ser: 0.81 mg/dL (ref 0.61–1.24)
GFR, Estimated: >60 mL/min (ref >60)
Glucose, Bld: 250 mg/dL — ABNORMAL HIGH (ref 70–99)
Potassium: 4.4 mmol/L (ref 3.5–5.1)
Sodium: 132 mmol/L — ABNORMAL LOW (ref 135–145)

## 2020-12-20 MED ORDER — DOCUSATE SODIUM 100 MG PO CAPS: 100.0000 mg | ORAL_CAPSULE | Freq: Two times a day (BID) | ORAL | 0 refills | Status: DC

## 2020-12-20 MED ORDER — ASPIRIN 81 MG PO CHEW: 81.0000 mg | CHEWABLE_TABLET | Freq: Two times a day (BID) | ORAL | 0 refills | Status: DC

## 2020-12-20 MED ORDER — HYDROCODONE-ACETAMINOPHEN 7.5-325 MG PO TABS: 1.0000 | ORAL_TABLET | ORAL | 0 refills | Status: DC | PRN

## 2020-12-20 NOTE — Discharge Instructions (Signed)

## 2020-12-20 NOTE — Discharge Summary (Signed)
Physician Discharge Summary  Patient ID: Mike Patel MRN: [id number] DOB/AGE: 1960-09-12 60 y.o.  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 Nodular and infiltrative.   DDD (degenerative disc disease), cervical    Diabetes mellitus without complication ([place])    Dysplastic nevus 07/14/2019   Left mid back. Moderate atypia, limited margins free.   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 Moderate atypia, deep margin involved.   Dysplastic nevus 07/14/2019   Right upper arm. Moderate atypia, deep margin involved.    GERD (gastroesophageal reflux disease)    Gout    Hypertension    Melanemia    Melanoma ([place]) 06/29/2019   0.32mm Level II,  spinal mid back WLE, [place]   Obesity    Pre-diabetes    Sleep apnea    cpap    Surgeries: Procedure(s): TOTAL HIP ARTHROPLASTY ANTERIOR APPROACH on 12/19/2020   Consultants (if any):   Discharged Condition: Improved  Hospital Course: SHARROD ACHILLE is an 60 y.o. 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    He was given perioperative antibiotics:  Anti-infectives (From admission, onward)    Start     Dose/Rate Route Frequency Ordered Stop   12/19/20 1730  ceFAZolin (ANCEF) IVPB 2g/100 mL premix        2 g 200 mL/hr over 30 Minutes Intravenous Every 6 hours 12/19/20 1643 12/19/20 2330   12/19/20 0909  ceFAZolin (ANCEF) 2-4  GM/100ML-% IVPB       Note to Pharmacy: Maryagnes Amos   : cabinet override      12/19/20 0909 12/19/20 1335   12/19/20 0600  ceFAZolin (ANCEF) IVPB 2g/100 mL premix        2 g 200 mL/hr over 30 Minutes Intravenous On call to O.R. 12/19/20 0029 12/19/20 1241     .  He was given sequential compression devices, early ambulation, and aspirin for DVT prophylaxis.  He benefited maximally from the hospital stay and there were no complications.    Recent vital signs:  Vitals:   12/20/20 0804 12/20/20 1143  BP: 134/76 129/74  Pulse: 92 78  Resp: 18 18  Temp: 98.2 F (36.8 C) 98 F (36.7 C)  SpO2: 97% 96%    Recent laboratory studies:  Lab Results  Component Value Date   HGB 16.1 12/20/2020   HGB 16.7 12/13/2020   HGB 15.5 04/09/2020   Lab Results  Component Value Date   WBC 14.1 (H) 12/20/2020   PLT 237 12/20/2020   Lab Results  Component Value Date   INR 1.1 12/13/2020   Lab Results  Component Value Date   NA 132 (L) 12/20/2020   K 4.4 12/20/2020   CL 97 (L) 12/20/2020   CO2 26 12/20/2020   BUN 15 12/20/2020   CREATININE 0.81 12/20/2020   GLUCOSE 250 (H) 12/20/2020    Discharge Medications:   Allergies as of 12/20/2020  Reactions   Percocet [oxycodone-acetaminophen] Itching   Tape Rash   Dermabond skin glue and Tape        Medication List     STOP taking these medications    aspirin EC 81 MG tablet Replaced by: aspirin 81 MG chewable tablet       TAKE these medications    amLODipine 10 MG tablet Commonly known as: NORVASC Take 10 mg by mouth every morning.   aspirin 81 MG chewable tablet Chew 1 tablet (81 mg total) by mouth 2 (two) times daily. Replaces: aspirin EC 81 MG tablet   cyanocobalamin 1000 MCG/ML injection Commonly known as: (VITAMIN B-12) Inject 1,000 mcg into the muscle every 30 (thirty) days.   dapagliflozin propanediol 10 MG Tabs tablet Commonly known as: FARXIGA Take by mouth daily.   docusate sodium 100 MG  capsule Commonly known as: COLACE Take 1 capsule (100 mg total) by mouth 2 (two) times daily.   Fish Oil 1000 MG Caps Take 2,000 mg by mouth daily.   fluticasone 50 MCG/ACT nasal spray Commonly known as: FLONASE Place 2 sprays into both nostrils in the morning and at bedtime.   hydrochlorothiazide 12.5 MG tablet Commonly known as: HYDRODIURIL Take 12.5 mg by mouth daily.   HYDROcodone-acetaminophen 7.5-325 MG tablet Commonly known as: NORCO Take 1 tablet by mouth every 4 (four) hours as needed for moderate pain.   lansoprazole 30 MG capsule Commonly known as: PREVACID Take 30 mg by mouth daily.   lisinopril 40 MG tablet Commonly known as: ZESTRIL Take 1 tablet (40 mg total) by mouth daily. What changed: how much to take   Melatonin 10 MG Tabs Take 10 mg by mouth at bedtime as needed (sleep).   meloxicam 15 MG tablet Commonly known as: MOBIC Take 15 mg by mouth daily.   metFORMIN 500 MG 24 hr tablet Commonly known as: GLUCOPHAGE-XR Take 500 mg by mouth every evening.   metoprolol succinate 50 MG 24 hr tablet Commonly known as: TOPROL-XL Take 100 mg by mouth daily. Take with or immediately following a meal.   pravastatin 10 MG tablet Commonly known as: PRAVACHOL Take 10 mg by mouth at bedtime.   tadalafil 20 MG tablet Commonly known as: CIALIS Take 1 tablet (20 mg total) by mouth daily as needed for erectile dysfunction.   testosterone cypionate 200 MG/ML injection Commonly known as: DEPOTESTOSTERONE CYPIONATE Inject 100 mg into the muscle every 7 (seven) days.   traZODone 50 MG tablet Commonly known as: DESYREL Take 50 mg by mouth at bedtime.   Turmeric 500 MG Caps Take 500 mg by mouth daily.   Vitamin D-3 125 MCG (5000 UT) Tabs Take 5,000 Units by mouth 2 (two) times daily.   zinc gluconate 50 MG tablet Take 50 mg by mouth daily.               Durable Medical Equipment  (From admission, onward)           Start     Ordered   12/20/20  1404  For home use only DME 3 n 1  Once        12/20/20 1403   12/20/20 1404  For home use only DME Walker rolling  Once       Question Answer Comment  Walker: With 5 Inch Wheels   Patient needs a walker to treat with the following condition Osteoarthritis of right hip      12/20/20 1403   12/19/20 1644  DME Gilford Rile  rolling  Once       Question:  Patient needs a walker to treat with the following condition  Answer:  History of total hip replacement, right   12/19/20 1643   12/19/20 1644  DME 3 n 1  Once        12/19/20 1643   12/19/20 1644  DME Bedside commode  Once       Question:  Patient needs a bedside commode to treat with the following condition  Answer:  History of total hip replacement, right   12/19/20 1643            Diagnostic Studies: DG C-Arm 1-60 Min-No Report  Result Date: 12/19/2020 Fluoroscopy was utilized by the requesting physician.  No radiographic interpretation.   DG HIP UNILAT WITH PELVIS 1V RIGHT  Result Date: 12/19/2020 CLINICAL DATA:  Hip replacement EXAM: DG HIP (WITH OR WITHOUT PELVIS) 1V RIGHT COMPARISON:  MRI 12/29/2016, CT 04/07/2020 FINDINGS: Five low resolution intraoperative spot views of the right hip. Total fluoroscopy time was 6 seconds. Images were obtained during operative course of right hip replacement. IMPRESSION: Intraoperative fluoroscopic assistance provided during right hip replacement surgery Electronically Signed   By: Donavan Foil M.D.   On: 12/19/2020 15:19    Disposition: Discharge disposition: 01-Home or Self Care            Signed: Carlynn Spry ,PA-C 12/20/2020, 2:04 PM

## 2020-12-20 NOTE — Progress Notes (Signed)
Physical Therapy Treatment Patient Details Name: Mike Patel MRN: [id number] DOB: 1960/06/12 Today's Date: 12/20/2020   History of Present Illness Pt is a 60 yo male diagnosed with osteoarthritis of the right hip and is s/p elective R THA.  PMH incldues HTN, gout, DM, cervical DDD, aortic atherosclerosis, L2-5 fusion, and basal cell carcinoma of the lip.   PT Comments    Pt was pleasant and motivated to participate during the session and put forth good effort throughout. Pt was able to sit up from supine with no assistance. Pt required min guard for safety when performing STS and min cuing for hand placement. Pt was able to ambulate a total of 171ft x2 with min guard for safety and no experience of LOB or adverse symptoms. Pt completed stair training with min guard for safety and min cuing for hand placement and proper sequencing and did not experience any LOB. Pt's SpO2 and HR remained WNL throughout the session. Pt will benefit from HHPT upon discharge to safely address deficits listed in patient problem list for decreased caregiver assistance and eventual return to PLOF.   Recommendations for follow up therapy are one component of a multi-disciplinary discharge planning process, led by the attending physician.  Recommendations may be updated based on patient status, additional functional criteria and insurance authorization.  Follow Up Recommendations  Home health PT     Assistance Recommended at Discharge Intermittent Supervision/Assistance  Equipment Recommendations  None recommended by PT    Recommendations for Other Services       Precautions / Restrictions Precautions Precautions: Anterior Hip Precaution Booklet Issued: Yes (comment) Restrictions Weight Bearing Restrictions: Yes RLE Weight Bearing: Weight bearing as tolerated     Mobility  Bed Mobility Overal bed mobility: Modified Independent             General bed mobility comments: Extra time and effort only     Transfers Overall transfer level: Needs assistance Equipment used: Rolling walker (2 wheels) Transfers: Sit to/from Stand Sit to Stand: Min guard           General transfer comment: Min verbal cues for hand placement, min guard for safety    Ambulation/Gait Ambulation/Gait assistance: Min guard Gait Distance (Feet): 125 Feet x2 Assistive device: Rolling walker (2 wheels) Gait Pattern/deviations: Step-to pattern;Step-through pattern;Decreased stance time - right Gait velocity: decreased     General Gait Details: Step to pattern that gradually progressed to step through pattern; pt steady without LOB with no adverse symptoms other than reports of being tired   Stairs Stairs: Yes Stairs assistance: Min guard Stair Management: Two rails;Forwards;Step to pattern Number of Stairs: 4 General stair comments: Min guard for safety, min cuing for hand placement and proper sequencing, wife present during practice   Wheelchair Mobility    Modified Rankin (Stroke Patients Only)       Balance Overall balance assessment: Needs assistance Sitting-balance support: No upper extremity supported;Feet supported Sitting balance-Leahy Scale: Normal     Standing balance support: Bilateral upper extremity supported;During functional activity Standing balance-Leahy Scale: Good                              Cognition Arousal/Alertness: Awake/alert Behavior During Therapy: WFL for tasks assessed/performed Overall Cognitive Status: Within Functional Limits for tasks assessed  Exercises Total Joint Exercises Ankle Circles/Pumps: AROM;Strengthening;Both;10 reps;Supine Other Exercises Other Exercises: Pt/family education on car transfers Other Exercises: Pt education on health benefits of sitting up in recliner    General Comments        Pertinent Vitals/Pain Pain Assessment: 0-10 Pain Score: 4  Pain  Location: R hip Pain Descriptors / Indicators: Sore;Aching;Dull Pain Intervention(s): Repositioned    Home Living Family/patient expects to be discharged to:: Private residence Living Arrangements: Spouse/significant other Available Help at Discharge: Family;Available 24 hours/day Type of Home: House Home Access: Ramped entrance;Level entry       Home Layout: One level Home Equipment: BSC/3in1;Shower seat;Rolling Walker (2 wheels);Adaptive equipment      Prior Function            PT Goals (current goals can now be found in the care plan section) Acute Rehab PT Goals Patient Stated Goal: To walk without pain PT Goal Formulation: With patient Time For Goal Achievement: 01/02/21 Potential to Achieve Goals: Good Progress towards PT goals: Progressing toward goals    Frequency    BID      PT Plan Current plan remains appropriate    Co-evaluation              AM-PAC PT "6 Clicks" Mobility   Outcome Measure  Help needed turning from your back to your side while in a flat bed without using bedrails?: A Little Help needed moving from lying on your back to sitting on the side of a flat bed without using bedrails?: A Little Help needed moving to and from a bed to a chair (including a wheelchair)?: A Little Help needed standing up from a chair using your arms (e.g., wheelchair or bedside chair)?: A Little Help needed to walk in hospital room?: A Little Help needed climbing 3-5 steps with a railing? : A Little 6 Click Score: 18    End of Session Equipment Utilized During Treatment: Gait belt Activity Tolerance: Patient tolerated treatment well Patient left: in chair;with call bell/phone within reach;with chair alarm set;with family/visitor present;Other (comment);with SCD's reapplied (wife present, nursing notified for ice) Nurse Communication: Mobility status;Weight bearing status;Other (comment) (Ice requested) PT Visit Diagnosis: Other abnormalities of gait and  mobility (R26.89);Muscle weakness (generalized) (M62.81);Pain Pain - Right/Left: Right Pain - part of body: Hip     Time: 0315-9458 PT Time Calculation (min) (ACUTE ONLY): 38 min  Charges:                        Sheldon Silvan SPT 12/20/20, 4:20 PM

## 2020-12-20 NOTE — Evaluation (Signed)
Occupational Therapy Evaluation Patient Details Name: Mike Patel MRN: [id number] DOB: Oct 22, 1960 Today's Date: 12/20/2020   History of Present Illness Pt is a 60 yo male diagnosed with osteoarthritis of the right hip and is s/p elective R THA.  PMH incldues HTN, gout, DM, cervical DDD, aortic atherosclerosis, L2-5 fusion, and basal cell carcinoma of the lip.   Clinical Impression   Pt seen for OT evaluation this date, POD#1 from above surgery. Pt was independent in all ADLs prior to surgery, however was increasingly limited 2/2 R hip pain. Works as a Pharmacist, community. Pt is eager to return to PLOF with less pain and improved safety and independence. Pt currently requires PRN MIN A for LB dressing and bathing while in seated position due to pain and limited AROM of R hip. Pt/family instructed in self care skills, falls prevention strategies, home/routines modifications, and DME/AE for LB bathing and dressing tasks. Handout provided to support recall and carryover. Pt/family verbalized understanding. All education/training provided at time of initial evaluation. No additional skilled OT needs at this time. Will sign off.      Recommendations for follow up therapy are one component of a multi-disciplinary discharge planning process, led by the attending physician.  Recommendations may be updated based on patient status, additional functional criteria and insurance authorization.   Follow Up Recommendations  No OT follow up    Assistance Recommended at Discharge PRN  Functional Status Assessment  Patient has had a recent decline in their functional status and demonstrates the ability to make significant improvements in function in a reasonable and predictable amount of time.  Equipment Recommendations  None recommended by OT    Recommendations for Other Services       Precautions / Restrictions Precautions Precautions: Anterior Hip Precaution Booklet Issued: Yes (comment) Restrictions Weight  Bearing Restrictions: Yes RLE Weight Bearing: Weight bearing as tolerated      Mobility Bed Mobility Overal bed mobility: Modified Independent                       Balance                              ADL either performed or assessed with clinical judgement   ADL Overall ADL's : Modified independent                                       General ADL Comments: PRN MIN A for threading R foot through clothing and bathing from seated position. Family able to provide needed level of assist     Vision         Perception     Praxis      Pertinent Vitals/Pain Pain Assessment: 0-10 Pain Score: 5  Pain Location: R hip Pain Descriptors / Indicators: Sore;Aching Pain Intervention(s): Limited activity within patient's tolerance;Monitored during session;Premedicated before session     Hand Dominance Right   Extremity/Trunk Assessment Upper Extremity Assessment Upper Extremity Assessment: Overall WFL for tasks assessed   Lower Extremity Assessment Lower Extremity Assessment: RLE deficits/detail RLE Deficits / Details: s/p R THA RLE: Unable to fully assess due to pain RLE Sensation: WNL   Cervical / Trunk Assessment Cervical / Trunk Assessment: Normal   Communication Communication Communication: No difficulties   Cognition Arousal/Alertness: Awake/alert Behavior During Therapy: WFL for tasks assessed/performed Overall Cognitive  Status: Within Functional Limits for tasks assessed                                       General Comments       Exercises Other Exercises: Pt/family instructed in falls prevention, home/routines modifications, and AE/DME with handout provided to support recall and carryover   Shoulder Instructions      Home Living Family/patient expects to be discharged to:: Private residence Living Arrangements: Spouse/significant other Available Help at Discharge: Family;Available 24  hours/day Type of Home: House Home Access: Ramped entrance;Level entry     Home Layout: One level     Bathroom Shower/Tub: Occupational psychologist: Standard     Home Equipment: BSC/3in1;Shower seat;Rolling Environmental consultant (2 wheels);Adaptive equipment Adaptive Equipment: Reacher        Prior Functioning/Environment Prior Level of Function : Independent/Modified Independent;Driving;Working/employed             Mobility Comments: Ind amb community distances without an AD, no fall history, works as a Pharmacist, community ADLs Comments: Ind with ADLs        OT Problem List: Decreased strength;Pain      OT Treatment/Interventions:      OT Goals(Current goals can be found in the care plan section) Acute Rehab OT Goals Patient Stated Goal: recover OT Goal Formulation: All assessment and education complete, DC therapy  OT Frequency:     Barriers to D/C:            Co-evaluation              AM-PAC OT "6 Clicks" Daily Activity     Outcome Measure Help from another person eating meals?: None Help from another person taking care of personal grooming?: None Help from another person toileting, which includes using toliet, bedpan, or urinal?: None Help from another person bathing (including washing, rinsing, drying)?: A Little Help from another person to put on and taking off regular upper body clothing?: None Help from another person to put on and taking off regular lower body clothing?: A Little 6 Click Score: 22   End of Session    Activity Tolerance: Patient tolerated treatment well Patient left: in bed;with call bell/phone within reach;with bed alarm set;with family/visitor present;with SCD's reapplied  OT Visit Diagnosis: Other abnormalities of gait and mobility (R26.89);Pain Pain - Right/Left: Right Pain - part of body: Hip                Time: 8366-2947 OT Time Calculation (min): 21 min Charges:  OT General Charges $OT Visit: 1 Visit OT Evaluation $OT Eval  Low Complexity: 1 Low OT Treatments $Self Care/Home Management : 8-22 mins  Ardeth Perfect., MPH, MS, OTR/L ascom [phone] 12/20/20, 1:53 PM

## 2020-12-20 NOTE — Progress Notes (Addendum)
Inpatient Diabetes Program Recommendations  AACE/ADA: New Consensus Statement on Inpatient Glycemic Control   Target Ranges:  Prepandial:   less than 140 mg/dL      Peak postprandial:   less than 180 mg/dL (1-2 hours)      Critically ill patients:  140 - 180 mg/dL    Latest Reference Range & Units 12/19/20 09:01 12/19/20 14:54 12/19/20 16:03  Glucose-Capillary 70 - 99 mg/dL 180 (H) 233 (H) 244 (H)  (H): Data is abnormally high  Review of Glycemic Control  Diabetes history: DM2 Outpatient Diabetes medications: Metformin XR 500 mg QPM, Farxiga 10 mg daily Current orders for Inpatient glycemic control: Metformin XR 500 mg QPM, Farxiga 10 mg daily  Inpatient Diabetes Program Recommendations:    Insulin: While inpatient, please consider ordering CBGs AC&HS with Novolog 0-9 units TID with meals and Novolog 0-5 units QHS.  Diet: Please discontinue Reg diet and order Carb Modified diet.  NOTE: Patient admitted following hip replacement surgery on 12/19/20. Noted patient received Decadron 10 mg x1 on 12/19/20 which is contributing to hyperglycemia.   Thanks, Barnie Alderman, RN, MSN, CDE Diabetes Coordinator Inpatient Diabetes Program [phone] (Team Pager from 8am to 5pm)

## 2020-12-20 NOTE — Evaluation (Signed)
Physical Therapy Evaluation Patient Details Name: Mike Patel MRN: [id number] DOB: [birth year] Today's Date: 12/20/2020  History of Present Illness  Pt is a 60 yo male diagnosed with osteoarthritis of the right hip and is s/p elective R THA.  PMH incldues HTN, gout, DM, cervical DDD, aortic atherosclerosis, L2-5 fusion, and basal cell carcinoma of the lip.   Clinical Impression  Pt was pleasant and motivated to participate during the session and put forth good effort throughout.  Pt required no physical assistance during the session and demonstrated good stability with transfers and gait.  Pt's gait pattern progressed from step-to to step-through as the session progressed and reported no adverse symptoms other than mod R hip pain during the session.  Pt is expected to make very good progress towards goals while in acute care and will benefit from HHPT upon discharge to safely address deficits listed in patient problem list for decreased caregiver assistance and eventual return to PLOF.         Recommendations for follow up therapy are one component of a multi-disciplinary discharge planning process, led by the attending physician.  Recommendations may be updated based on patient status, additional functional criteria and insurance authorization.  Follow Up Recommendations Home health PT    Assistance Recommended at Discharge Intermittent Supervision/Assistance  Functional Status Assessment Patient has had a recent decline in their functional status and demonstrates the ability to make significant improvements in function in a reasonable and predictable amount of time.  Equipment Recommendations  None recommended by PT    Recommendations for Other Services       Precautions / Restrictions Precautions Precautions: Anterior Hip Precaution Booklet Issued: Yes (comment) Restrictions Weight Bearing Restrictions: Yes RLE Weight Bearing: Weight bearing as tolerated      Mobility  Bed  Mobility Overal bed mobility: Modified Independent             General bed mobility comments: Extra time and effort only    Transfers Overall transfer level: Needs assistance Equipment used: Rolling walker (2 wheels) Transfers: Sit to/from Stand Sit to Stand: Min guard           General transfer comment: Min verbal cues for hand placement    Ambulation/Gait Ambulation/Gait assistance: Min guard Gait Distance (Feet): 40 Feet Assistive device: Rolling walker (2 wheels) Gait Pattern/deviations: Step-to pattern;Step-through pattern;Decreased stance time - right Gait velocity: decreased     General Gait Details: Step to pattern that gradually progressed to step through pattern with min verbal cues for decreased UE WB through the walker; pt steady without LOB with no adverse symptoms other than mod R hip pain  Stairs            Wheelchair Mobility    Modified Rankin (Stroke Patients Only)       Balance Overall balance assessment: Needs assistance   Sitting balance-Leahy Scale: Normal     Standing balance support: Bilateral upper extremity supported;During functional activity Standing balance-Leahy Scale: Good                               Pertinent Vitals/Pain Pain Assessment: 0-10 Pain Score: 6  Pain Location: R hip Pain Descriptors / Indicators: Sore;Aching Pain Intervention(s): Premedicated before session;Repositioned;Monitored during session;Ice applied    Home Living Family/patient expects to be discharged to:: Private residence Living Arrangements: Spouse/significant other Available Help at Discharge: Family;Available 24 hours/day Type of Home: House Home Access: Ramped entrance;Level entry  Home Layout: One level Home Equipment: BSC/3in1;Shower seat;Rolling Walker (2 wheels)      Prior Function Prior Level of Function : Independent/Modified Independent             Mobility Comments: Ind amb community distances  without an AD, no fall history, works as a Pharmacist, community ADLs Comments: Ind with ADLs     Hand Dominance   Dominant Hand: Right    Extremity/Trunk Assessment   Upper Extremity Assessment Upper Extremity Assessment: Overall WFL for tasks assessed    Lower Extremity Assessment Lower Extremity Assessment: Generalized weakness;RLE deficits/detail RLE: Unable to fully assess due to pain       Communication   Communication: No difficulties  Cognition Arousal/Alertness: Awake/alert Behavior During Therapy: WFL for tasks assessed/performed Overall Cognitive Status: Within Functional Limits for tasks assessed                                          General Comments      Exercises Total Joint Exercises Ankle Circles/Pumps: AROM;Strengthening;Both;10 reps Quad Sets: 10 reps;Both;Strengthening Gluteal Sets: Strengthening;Both;10 reps Long Arc Quad: Strengthening;Both;10 reps Knee Flexion: Strengthening;Both;10 reps Marching in Standing: AROM;Strengthening;Both;5 reps;Standing Other Exercises Other Exercises: HEP education/review per handout Other Exercises: Anterior hip precaution education per handout   Assessment/Plan    PT Assessment Patient needs continued PT services  PT Problem List Decreased strength;Decreased activity tolerance;Decreased balance;Decreased mobility       PT Treatment Interventions DME instruction;Gait training;Stair training;Functional mobility training;Therapeutic activities;Therapeutic exercise;Balance training;Patient/family education    PT Goals (Current goals can be found in the Care Plan section)  Acute Rehab PT Goals Patient Stated Goal: To walk without pain PT Goal Formulation: With patient Time For Goal Achievement: 01/02/21 Potential to Achieve Goals: Good    Frequency BID   Barriers to discharge        Co-evaluation               AM-PAC PT "6 Clicks" Mobility  Outcome Measure Help needed turning from your  back to your side while in a flat bed without using bedrails?: A Little Help needed moving from lying on your back to sitting on the side of a flat bed without using bedrails?: A Little Help needed moving to and from a bed to a chair (including a wheelchair)?: A Little Help needed standing up from a chair using your arms (e.g., wheelchair or bedside chair)?: A Little Help needed to walk in hospital room?: A Little Help needed climbing 3-5 steps with a railing? : A Little 6 Click Score: 18    End of Session Equipment Utilized During Treatment: Gait belt Activity Tolerance: Patient tolerated treatment well Patient left: in chair;with call bell/phone within reach;with chair alarm set;with family/visitor present Nurse Communication: Mobility status;Weight bearing status PT Visit Diagnosis: Other abnormalities of gait and mobility (R26.89);Muscle weakness (generalized) (M62.81);Pain Pain - Right/Left: Right Pain - part of body: Hip    Time: 9767-3419 PT Time Calculation (min) (ACUTE ONLY): 38 min   Charges:   PT Evaluation $PT Eval Moderate Complexity: 1 Mod PT Treatments $Therapeutic Exercise: 8-22 mins        D. Scott Natividad Halls PT, DPT 12/20/20, 11:20 AM

## 2020-12-20 NOTE — Progress Notes (Signed)
Met with the patient and his wife in the room, He has a 3 in 1 and a RW at home, he has a Outpatient PT appointment on 12/6 with [facility], He is agreeable to try to get [facility] for one week, I reached out to [facility] and am awaiting a response, I reached out to [place] with [facility], awaiting a response, reache dout to [place] at [facility], they are not able to accept they are ONN, I reached out to [place] [facility] with [facility] awaiting a response, Patient has transportation and can afford his medication

## 2020-12-20 NOTE — Anesthesia Postprocedure Evaluation (Signed)
Anesthesia Post Note  Patient: Mike Patel  Procedure(s) Performed: TOTAL HIP ARTHROPLASTY ANTERIOR APPROACH (Right: Hip)  Patient location during evaluation: PACU Anesthesia Type: General Level of consciousness: awake and alert Pain management: pain level controlled Vital Signs Assessment: post-procedure vital signs reviewed and stable Respiratory status: spontaneous breathing, nonlabored ventilation, respiratory function stable and patient connected to nasal cannula oxygen Cardiovascular status: blood pressure returned to baseline and stable Postop Assessment: no apparent nausea or vomiting Anesthetic complications: no   No notable events documented.   Last Vitals:  Vitals:   12/19/20 1928 12/20/20 0415  BP: 123/78 133/82  Pulse: 91 98  Resp: 18 17  Temp: (!) 36.2 C 36.7 C  SpO2: 93% 96%    Last Pain:  Vitals:   12/20/20 0152  TempSrc:   PainSc: [name]

## 2020-12-20 NOTE — Progress Notes (Signed)
  Subjective:  Patient reports pain as mild.    Objective:   VITALS:   Vitals:   12/19/20 1928 12/20/20 0415 12/20/20 0804 12/20/20 1143  BP: 123/78 133/82 134/76 129/74  Pulse: 91 98 92 78  Resp: 18 17 18 18   Temp: (!) 97.1 F (36.2 C) 98.1 F (36.7 C) 98.2 F (36.8 C) 98 F (36.7 C)  TempSrc:    Oral  SpO2: 93% 96% 97% 96%  Weight:      Height:        PHYSICAL EXAM:  Neurologically intact ABD soft Neurovascular intact Sensation intact distally Intact pulses distally Dorsiflexion/Plantar flexion intact Incision: dressing C/D/I No cellulitis present Compartment soft  LABS  Results for orders placed or performed during the hospital encounter of 12/19/20 (from the past 24 hour(s))  Glucose, capillary     Status: Abnormal   Collection Time: 12/19/20  2:54 PM  Result Value Ref Range   Glucose-Capillary 233 (H) 70 - 99 mg/dL  Glucose, capillary     Status: Abnormal   Collection Time: 12/19/20  4:03 PM  Result Value Ref Range   Glucose-Capillary 244 (H) 70 - 99 mg/dL  CBC     Status: Abnormal   Collection Time: 12/20/20  1:48 AM  Result Value Ref Range   WBC 14.1 (H) 4.0 - 10.5 K/uL   RBC 5.29 4.22 - 5.81 MIL/uL   Hemoglobin 16.1 13.0 - 17.0 g/dL   HCT 48.8 39.0 - 52.0 %   MCV 92.2 80.0 - 100.0 fL   MCH 30.4 26.0 - 34.0 pg   MCHC 33.0 30.0 - 36.0 g/dL   RDW 13.0 11.5 - 15.5 %   Platelets 237 150 - 400 K/uL   nRBC 0.0 0.0 - 0.2 %  Basic metabolic panel     Status: Abnormal   Collection Time: 12/20/20  1:48 AM  Result Value Ref Range   Sodium 132 (L) 135 - 145 mmol/L   Potassium 4.4 3.5 - 5.1 mmol/L   Chloride 97 (L) 98 - 111 mmol/L   CO2 26 22 - 32 mmol/L   Glucose, Bld 250 (H) 70 - 99 mg/dL   BUN 15 8 - 23 mg/dL   Creatinine, Ser 0.81 0.61 - 1.24 mg/dL   Calcium 8.6 (L) 8.9 - 10.3 mg/dL   GFR, Estimated >60 >60 mL/min   Anion gap 9 5 - 15    DG C-Arm 1-60 Min-No Report  Result Date: 12/19/2020 Fluoroscopy was utilized by the requesting physician.   No radiographic interpretation.   DG HIP UNILAT WITH PELVIS 1V RIGHT  Result Date: 12/19/2020 CLINICAL DATA:  Hip replacement EXAM: DG HIP (WITH OR WITHOUT PELVIS) 1V RIGHT COMPARISON:  MRI 12/29/2016, CT 04/07/2020 FINDINGS: Five low resolution intraoperative spot views of the right hip. Total fluoroscopy time was 6 seconds. Images were obtained during operative course of right hip replacement. IMPRESSION: Intraoperative fluoroscopic assistance provided during right hip replacement surgery Electronically Signed   By: Donavan Foil M.D.   On: 12/19/2020 15:19    Assessment/Plan: 1 Day Post-Op   Principal Problem:   History of total hip replacement, right   [facility] diet Up with therapy Discharge home today   Mike Patel , PA-C 12/20/2020, 1:58 PM

## 2020-12-21 LAB — SURGICAL PATHOLOGY

## 2020-12-26 ENCOUNTER — Ambulatory Visit: Payer: BC Managed Care – PPO | Admitting: Dermatology

## 2020-12-28 ENCOUNTER — Encounter: Payer: Self-pay | Admitting: Orthopedic Surgery

## 2021-03-25 ENCOUNTER — Other Ambulatory Visit: Payer: Self-pay | Admitting: Urology

## 2021-04-12 ENCOUNTER — Other Ambulatory Visit: Payer: Self-pay | Admitting: Family Medicine

## 2021-04-12 ENCOUNTER — Other Ambulatory Visit: Payer: Self-pay

## 2021-04-12 ENCOUNTER — Encounter: Payer: Self-pay | Admitting: Urology

## 2021-04-12 ENCOUNTER — Ambulatory Visit (INDEPENDENT_AMBULATORY_CARE_PROVIDER_SITE_OTHER): Payer: BC Managed Care – PPO | Admitting: Urology

## 2021-04-12 VITALS — BP 174/91 | HR 77 | Ht 69.0 in | Wt 320.0 lb

## 2021-04-12 DIAGNOSIS — E291 Testicular hypofunction: Secondary | ICD-10-CM | POA: Diagnosis not present

## 2021-04-12 MED ORDER — TESTOSTERONE CYPIONATE 200 MG/ML IM SOLN: 100.0000 mg | INTRAMUSCULAR | 0 refills | Status: DC

## 2021-04-12 NOTE — Progress Notes (Signed)
? ?04/12/2021 ?8:18 AM  ? ?Mike Patel ?01-Jul-1960 ?[id number] ? ?Referring provider: Maryland Pink, MD ?[place] ?[facility] ?[place],  [place] [address] ? ?Chief Complaint  ?Patient presents with  ? Other  ? ? ?Urologic history: ?1.  BPH ?TURP in October 2015 ? ?2.  Hypogonadism ?On TRT managed by Dr. Kary Kos ? ?3.  Erectile dysfunction ?Tadalafil 20 mg ? ?4..  History hematospermia ? ?HPI: ?61 y.o. male presents for hypogonadism ? ?Long history hypogonadism previously managed by Dr. Jacqlyn Larsen and for the past several years managed by Dr. Kary Kos ?Patient states his insurance company is asking for prior authorization for testosterone however there are no documented low testosterone levels he states that Dr. Kary Kos recommended urology start following his hypogonadism ?He was started on TRT in Dr. Bjorn Loser previous private practice and those records are no longer available ?All current testosterone results are while he was on TRT and within the normal range ? ? ?PMH: ?Past Medical History:  ?Diagnosis Date  ? Aortic atherosclerosis ([place])   ? Arthritis   ? BACK  ? Basal cell carcinoma 05/19/2019  ? Right lower lip. Nodular and infiltrative.  ? DDD (degenerative disc disease), cervical   ? Diabetes mellitus without complication ([place])   ? Dysplastic nevus 07/14/2019  ? Left mid back. Moderate atypia, limited margins free.  ? Dysplastic nevus 07/14/2019  ? Right spinal mid back. Moderate atypia, deep margin involved.  ? Dysplastic nevus 07/14/2019  ? Right upper arm. Moderate atypia, deep margin involved.   ? GERD (gastroesophageal reflux disease)   ? Gout   ? Hypertension   ? Melanemia   ? Melanoma ([place]) 06/29/2019  ? 0.36m Level II,  spinal mid back WLE, [place]  ? Obesity   ? Pre-diabetes   ? Sleep apnea   ? cpap  ? ? ?Surgical History: ?Past Surgical History:  ?Procedure Laterality Date  ? BACK SURGERY  2018  ? x3  ? CHOLECYSTECTOMY N/A 12/02/2017  ? Procedure: LAPAROSCOPIC CHOLECYSTECTOMY WITH  INTRAOPERATIVE CHOLANGIOGRAM;  Surgeon: POlean Ree MD;  Location: [facility];  Service: General;  Laterality: N/A;  ? COLONOSCOPY N/A 05/24/2014  ? Procedure: COLONOSCOPY;  Surgeon: PHulen Luster MD;  [facility];  Service: Gastroenterology;  Laterality: N/A;  ? COLONOSCOPY WITH PROPOFOL N/A 05/30/2020  ? Procedure: COLONOSCOPY WITH PROPOFOL;  Surgeon: Toledo, TBenay Pike MD;  Location: [facility] END[facility]COPY;  Service: Gastroenterology;  Laterality: N/A;  ? ELBOW SURGERY Right   ? tennis elbow  ? ERCP N/A 11/18/2017  ? Procedure: ENDOSCOPIC RETROGRADE CHOLANGIOPANCREATOGRAPHY (ERCP);  Surgeon: WLucilla Lame MD;  [facility];  Service: Endoscopy;  Laterality: N/A;  ? ESOPHAGOGASTRODUODENOSCOPY N/A 05/24/2014  ? Procedure: ESOPHAGOGASTRODUODENOSCOPY (EGD);  Surgeon: PHulen Luster MD;  [facility];  Service: Gastroenterology;  Laterality: N/A;  ? PROSTATE SURGERY  2015  ? SPINE SURGERY    ? l-spine x 2  ? TOTAL HIP ARTHROPLASTY Right 12/19/2020  ? Procedure: TOTAL HIP ARTHROPLASTY ANTERIOR APPROACH;  Surgeon: BLovell Sheehan MD;  Location: [facility];  Service: Orthopedics;  Laterality: Right;  ? ? ?Home Medications:  ?Allergies as of 04/12/2021   ? ?   Reactions  ? Percocet [oxycodone-acetaminophen] Itching  ? Tape Rash  ? Dermabond skin glue and Tape  ? ?  ? ?  ?Medication List  ?  ? ?  ? Accurate as of April 12, 2021  8:18 AM. If you have any questions, ask your nurse or doctor.  ?  ?  ? ?  ? ?  STOP taking these medications   ? ?dapagliflozin propanediol 10 MG Tabs tablet ?Commonly known as: FARXIGA ?Stopped by: Abbie Sons, MD ?  ?docusate sodium 100 MG capsule ?Commonly known as: COLACE ?Stopped by: Abbie Sons, MD ?  ?HYDROcodone-acetaminophen 7.5-325 MG tablet ?Commonly known as: NORCO ?Stopped by: Abbie Sons, MD ?  ?Turmeric 500 MG Caps ?Stopped by: Abbie Sons, MD ?  ? ?  ? ?TAKE these medications   ? ?amLODipine 10 MG tablet ?Commonly known as: NORVASC ?Take 10 mg by  mouth every morning. ?  ?aspirin 81 MG chewable tablet ?Chew 1 tablet (81 mg total) by mouth 2 (two) times daily. ?  ?cyanocobalamin 1000 MCG/ML injection ?Commonly known as: (VITAMIN B-12) ?Inject 1,000 mcg into the muscle every 30 (thirty) days. ?  ?Fish Oil 1000 MG Caps ?Take 2,000 mg by mouth daily. ?  ?fluticasone 50 MCG/ACT nasal spray ?Commonly known as: FLONASE ?Place 2 sprays into both nostrils in the morning and at bedtime. ?  ?hydrochlorothiazide 12.5 MG tablet ?Commonly known as: HYDRODIURIL ?Take 12.5 mg by mouth daily. ?  ?lansoprazole 30 MG capsule ?Commonly known as: PREVACID ?Take 30 mg by mouth daily. ?  ?lisinopril 40 MG tablet ?Commonly known as: ZESTRIL ?Take 1 tablet (40 mg total) by mouth daily. ?What changed: how much to take ?  ?Melatonin 10 MG Tabs ?Take 10 mg by mouth at bedtime as needed (sleep). ?  ?meloxicam 15 MG tablet ?Commonly known as: MOBIC ?Take 15 mg by mouth daily. ?  ?metFORMIN 500 MG 24 hr tablet ?Commonly known as: GLUCOPHAGE-XR ?Take 500 mg by mouth every evening. ?  ?metoprolol succinate 50 MG 24 hr tablet ?Commonly known as: TOPROL-XL ?Take 100 mg by mouth daily. Take with or immediately following a meal. ?  ?pravastatin 10 MG tablet ?Commonly known as: PRAVACHOL ?Take 10 mg by mouth at bedtime. ?  ?tadalafil 20 MG tablet ?Commonly known as: CIALIS ?TAKE 1 TABLET BY MOUTH ONCE DAILY AS NEEDED FOR ERECTILE DYSFUNCTION ?  ?testosterone cypionate 200 MG/ML injection ?Commonly known as: DEPOTESTOSTERONE CYPIONATE ?Inject 100 mg into the muscle every 7 (seven) days. ?  ?traZODone 50 MG tablet ?Commonly known as: DESYREL ?Take 50 mg by mouth at bedtime. ?  ?Vitamin D-3 125 MCG (5000 UT) Tabs ?Take 5,000 Units by mouth 2 (two) times daily. ?  ?zinc gluconate 50 MG tablet ?Take 50 mg by mouth daily. ?  ? ?  ? ? ?Allergies:  ?Allergies  ?Allergen Reactions  ? Percocet [Oxycodone-Acetaminophen] Itching  ? Tape Rash  ?  Dermabond skin glue and Tape  ? ? ?Family History: ?Family  History  ?Problem Relation Age of Onset  ? Hypertension Mother   ? ? ?Social History:  reports that he has never smoked. He has never used smokeless tobacco. He reports that he does not drink alcohol and does not use drugs. ? ? ?Physical Exam: ?BP (!) 174/91   Pulse 77   Ht '5\' 9"'$  (1.753 m)   Wt (!) 320 lb (145.2 kg)   BMI 47.26 kg/m?   ?Constitutional:  Alert and oriented, No acute distress. ? ? ?Assessment & Plan:   ? ?1.  Hypogonadism ?Will send in the Rx for testosterone but he was informed it would require prior authorization and he may need to stop his TRT for 1 month so that low levels can be documented. ? ? ?Abbie Sons, MD ? ?[facility] ?[address], [address] ?[place], [place] [address] ?[phone]  [phone] ? ? ?

## 2021-04-26 ENCOUNTER — Other Ambulatory Visit: Payer: Self-pay | Admitting: Urology

## 2021-04-26 NOTE — Telephone Encounter (Signed)
testosterone cypionate (DEPOTESTOSTERONE CYPIONATE) 200 MG/ML injection 10 mL 0 04/12/2021    ?Sig - Route: Inject 0.5 mLs (100 mg total) into the muscle every 7 (seven) days. - Intramuscular   ?Sent to pharmacy as: testosterone cypionate (DEPOTESTOSTERONE CYPIONATE) 200 MG/ML injection   ?E-Prescribing Status: Receipt confirmed by pharmacy (04/12/2021  8:24 AM EDT)   ? ?[facility] [place] [facility] but receipt shows 04/12/21. He feels like it may need to go to [facility]. Please advise patient. ?

## 2021-04-27 NOTE — Telephone Encounter (Signed)
He wants it sent to [facility].  ?

## 2021-04-27 NOTE — Addendum Note (Signed)
Addended by: Kyra Manges on: 04/27/2021 01:33 PM ? ? Modules accepted: Orders ? ?

## 2021-04-30 MED ORDER — TESTOSTERONE CYPIONATE 200 MG/ML IM SOLN: 100.0000 mg | INTRAMUSCULAR | 0 refills | Status: DC

## 2021-05-05 ENCOUNTER — Other Ambulatory Visit: Payer: Self-pay | Admitting: Urology

## 2021-06-13 ENCOUNTER — Ambulatory Visit: Payer: BC Managed Care – PPO | Admitting: Dermatology

## 2021-06-21 ENCOUNTER — Other Ambulatory Visit: Payer: Self-pay | Admitting: Urology

## 2021-07-05 ENCOUNTER — Encounter: Payer: Self-pay | Admitting: Urology

## 2021-07-05 ENCOUNTER — Ambulatory Visit: Payer: BC Managed Care – PPO | Admitting: Urology

## 2021-07-05 VITALS — BP 159/79 | HR 76 | Ht 69.0 in | Wt 322.0 lb

## 2021-07-05 DIAGNOSIS — N400 Enlarged prostate without lower urinary tract symptoms: Secondary | ICD-10-CM

## 2021-07-05 DIAGNOSIS — E291 Testicular hypofunction: Secondary | ICD-10-CM | POA: Diagnosis not present

## 2021-07-05 DIAGNOSIS — N5201 Erectile dysfunction due to arterial insufficiency: Secondary | ICD-10-CM

## 2021-07-05 LAB — URINALYSIS, COMPLETE
Bilirubin, UA: NEGATIVE
Ketones, UA: NEGATIVE
Leukocytes,UA: NEGATIVE
Nitrite, UA: NEGATIVE
Protein,UA: NEGATIVE
RBC, UA: NEGATIVE
Specific Gravity, UA: 1.025 (ref 1.005–1.030)
Urobilinogen, Ur: 0.2 mg/dL (ref 0.2–1.0)
pH, UA: 7 (ref 5.0–7.5)

## 2021-07-05 LAB — MICROSCOPIC EXAMINATION: Bacteria, UA: NONE SEEN

## 2021-07-05 LAB — BLADDER SCAN AMB NON-IMAGING: Scan Result: 37

## 2021-07-05 MED ORDER — SILDENAFIL CITRATE 100 MG PO TABS: 100.0000 mg | ORAL_TABLET | Freq: Every day | ORAL | 0 refills | Status: DC | PRN

## 2021-07-05 NOTE — Progress Notes (Signed)
07/05/2021 1:31 PM   Mike Patel 1961/01/09 [id number]  Referring provider: Maryland Pink, MD [address] [place],  [place] [address]  Chief Complaint  Patient presents with   Benign Prostatic Hypertrophy    Urologic history: 1.  BPH TURP in October 2015  2.  Hypogonadism TRT testosterone cypionate  3.  Erectile dysfunction Tadalafil 20 mg  4..  History hematospermia  HPI: 61 y.o. male presents for annual follow-up.  Seen March 2023 to manage his hypogonadism previously done by Dr. Danise Edge 06/20/2021: Creatinine 0.98; PSA 1.6; testosterone 770; HCT 50.5 No bothersome LUTS.  IPSS today 8/35 Denies dysuria, gross hematuria No flank, abdominal or pelvic pain Inquiring if there is a stronger dose of tadalafil   PMH: Past Medical History:  Diagnosis Date   Aortic atherosclerosis ([place])    Arthritis    BACK   Basal cell carcinoma 05/19/2019   Right lower lip. Nodular and infiltrative.   DDD (degenerative disc disease), cervical    Diabetes mellitus without complication ([place])    Dysplastic nevus 07/14/2019   Left mid back. Moderate atypia, limited margins free.   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 Moderate atypia, deep margin involved.   Dysplastic nevus 07/14/2019   Right upper arm. Moderate atypia, deep margin involved.    GERD (gastroesophageal reflux disease)    Gout    Hypertension    Melanemia    Melanoma ([place]) 06/29/2019   0.22m Level II,  spinal mid back WLE, [place]   Obesity    Pre-diabetes    Sleep apnea    cpap    Surgical History: Past Surgical History:  Procedure Laterality Date   BACK SURGERY  2018   x3   CHOLECYSTECTOMY N/A 12/02/2017   Procedure: LAPAROSCOPIC CHOLECYSTECTOMY WITH INTRAOPERATIVE CHOLANGIOGRAM;  Surgeon: POlean Ree MD;  Location: [facility];  Service: General;  Laterality: N/A;   COLONOSCOPY N/A 05/24/2014   Procedure: COLONOSCOPY;  Surgeon: PHulen Luster MD;  Location:  [facility];  Service: Gastroenterology;  Laterality: N/A;   COLONOSCOPY WITH PROPOFOL N/A 05/30/2020   Procedure: COLONOSCOPY WITH PROPOFOL;  Surgeon: Toledo, TBenay Pike MD;  Location: [facility];  Service: Gastroenterology;  Laterality: N/A;   ELBOW SURGERY Right    tennis elbow   ERCP N/A 11/18/2017   Procedure: ENDOSCOPIC RETROGRADE CHOLANGIOPANCREATOGRAPHY (ERCP);  Surgeon: WLucilla Lame MD;  [facility];  Service: Endoscopy;  Laterality: N/A;   ESOPHAGOGASTRODUODENOSCOPY N/A 05/24/2014   Procedure: ESOPHAGOGASTRODUODENOSCOPY (EGD);  Surgeon: PHulen Luster MD;  [facility];  Service: Gastroenterology;  Laterality: N/A;   PROSTATE SURGERY  2015   SPINE SURGERY     l-spine x 2   TOTAL HIP ARTHROPLASTY Right 12/19/2020   Procedure: TOTAL HIP ARTHROPLASTY ANTERIOR APPROACH;  Surgeon: BLovell Sheehan MD;  Location: [facility];  Service: Orthopedics;  Laterality: Right;    Home Medications:  Allergies as of 07/05/2021       Reactions   Percocet [oxycodone-acetaminophen] Itching   Tape Rash   Dermabond skin glue and Tape        Medication List        Accurate as of July 05, 2021  1:31 PM. If you have any questions, ask your nurse or doctor.          amLODipine 10 MG tablet Commonly known as: NORVASC Take 10 mg by mouth every morning.   aspirin EC 81 MG tablet Take by mouth. What changed:  Another medication with the same name was removed. Continue taking this medication, and follow the directions you see here. Changed by: Abbie Sons, MD   cyanocobalamin 1000 MCG/ML injection Commonly known as: (VITAMIN B-12) Inject 1,000 mcg into the muscle every 30 (thirty) days.   Fish Oil 1000 MG Caps Take 2,000 mg by mouth daily.   fluticasone 50 MCG/ACT nasal spray Commonly known as: FLONASE Place 2 sprays into both nostrils in the morning and at bedtime.   hydrochlorothiazide 12.5 MG tablet Commonly known as: HYDRODIURIL Take 12.5 mg by  mouth daily.   lansoprazole 30 MG capsule Commonly known as: PREVACID Take 30 mg by mouth daily.   lisinopril 40 MG tablet Commonly known as: ZESTRIL Take 1 tablet (40 mg total) by mouth daily. What changed: how much to take   Melatonin 10 MG Tabs Take 10 mg by mouth at bedtime as needed (sleep).   meloxicam 15 MG tablet Commonly known as: MOBIC Take 15 mg by mouth daily.   metFORMIN 500 MG 24 hr tablet Commonly known as: GLUCOPHAGE-XR Take 500 mg by mouth every evening.   metoprolol succinate 50 MG 24 hr tablet Commonly known as: TOPROL-XL Take 100 mg by mouth daily. Take with or immediately following a meal.   OneTouch Verio test strip Generic drug: glucose blood 1 each daily.   pravastatin 10 MG tablet Commonly known as: PRAVACHOL Take 10 mg by mouth at bedtime.   tadalafil 20 MG tablet Commonly known as: CIALIS TAKE 1 TABLET BY MOUTH ONCE DAILY AS NEEDED FOR ERECTILE DYSFUNCTION   testosterone cypionate 200 MG/ML injection Commonly known as: DEPOTESTOSTERONE CYPIONATE Inject 0.5 mLs (100 mg total) into the muscle every 7 (seven) days.   traZODone 50 MG tablet Commonly known as: DESYREL Take 50 mg by mouth at bedtime.   Vitamin D-3 125 MCG (5000 UT) Tabs Take 5,000 Units by mouth 2 (two) times daily.   zinc gluconate 50 MG tablet Take 50 mg by mouth daily.        Allergies:  Allergies  Allergen Reactions   Percocet [Oxycodone-Acetaminophen] Itching   Tape Rash    Dermabond skin glue and Tape    Family History: Family History  Problem Relation Age of Onset   Hypertension Mother     Social History:  reports that he has never smoked. He has never used smokeless tobacco. He reports that he does not drink alcohol and does not use drugs.   Physical Exam: BP (!) 159/79   Pulse 76   Ht '5\' 9"'$  (1.753 m)   Wt (!) 322 lb (146.1 kg)   BMI 47.55 kg/m   Constitutional:  Alert and oriented, No acute distress. HEENT: [place] AT, moist mucus membranes.   Trachea midline, no masses. Cardiovascular: No clubbing, cyanosis, or edema. GU: Prostate 60 g, smooth without nodules Lymph: No cervical or inguinal lymphadenopathy. Skin: No rashes, bruises or suspicious lesions. Neurologic: Grossly intact, no focal deficits, moving all 4 extremities. Psychiatric: Normal mood and affect.   Assessment & Plan:    1. Benign prostatic hyperplasia without LUTS Status post TURP 2015 Bladder scan PVR 37 mL   2.  Erectile dysfunction Some decreased efficacy with tadalafil We discussed there is not a stronger dose however some patients feel 1 PDE 5 inhibitor works better and then then another He did desire a trial of sildenafil 100 mg and Rx sent to pharmacy for a trial Discussed intracavernosal injections if his ED worsens  3.  Hypogonadism Lab  visit 6 months testosterone, hematocrit Office visit 1 year testosterone, PSA, hematocrit   Abbie Sons, MD  [facility] [address] [place], [place] [address] [phone]

## 2021-08-02 ENCOUNTER — Ambulatory Visit: Payer: Self-pay | Admitting: Urology

## 2021-08-03 ENCOUNTER — Other Ambulatory Visit: Payer: Self-pay | Admitting: Urology

## 2021-08-05 ENCOUNTER — Other Ambulatory Visit: Payer: Self-pay | Admitting: Urology

## 2021-08-09 ENCOUNTER — Ambulatory Visit: Payer: BC Managed Care – PPO | Admitting: Urology

## 2021-09-08 ENCOUNTER — Other Ambulatory Visit: Payer: Self-pay | Admitting: Urology

## 2021-09-12 ENCOUNTER — Other Ambulatory Visit: Payer: Self-pay | Admitting: Urology

## 2021-09-12 NOTE — Telephone Encounter (Signed)
Patient called and stated that prior authorization is needed for his Testosterone rx. He is wanting to [facility]; [facility]. He only has one shot left.

## 2021-09-14 MED ORDER — TESTOSTERONE CYPIONATE 200 MG/ML IM SOLN: 100.0000 mg | INTRAMUSCULAR | 0 refills | Status: DC

## 2021-09-19 ENCOUNTER — Telehealth: Payer: Self-pay | Admitting: Family Medicine

## 2021-09-19 MED ORDER — TADALAFIL 20 MG PO TABS: 20.0000 mg | ORAL_TABLET | Freq: Every day | ORAL | 0 refills | Status: DC | PRN

## 2021-09-19 NOTE — Telephone Encounter (Signed)
Patient notified testosterone cypionate has been approved. Approval date: 09/18/2021-09/18/2023

## 2021-10-09 ENCOUNTER — Ambulatory Visit: Payer: BC Managed Care – PPO | Admitting: Dermatology

## 2021-10-09 DIAGNOSIS — Z86018 Personal history of other benign neoplasm: Secondary | ICD-10-CM

## 2021-10-09 DIAGNOSIS — Z85820 Personal history of malignant melanoma of skin: Secondary | ICD-10-CM

## 2021-10-09 DIAGNOSIS — D225 Melanocytic nevi of trunk: Secondary | ICD-10-CM | POA: Diagnosis not present

## 2021-10-09 DIAGNOSIS — Z1283 Encounter for screening for malignant neoplasm of skin: Secondary | ICD-10-CM | POA: Diagnosis not present

## 2021-10-09 DIAGNOSIS — Z85828 Personal history of other malignant neoplasm of skin: Secondary | ICD-10-CM

## 2021-10-09 DIAGNOSIS — L578 Other skin changes due to chronic exposure to nonionizing radiation: Secondary | ICD-10-CM | POA: Diagnosis not present

## 2021-10-09 DIAGNOSIS — D229 Melanocytic nevi, unspecified: Secondary | ICD-10-CM

## 2021-10-09 DIAGNOSIS — L814 Other melanin hyperpigmentation: Secondary | ICD-10-CM

## 2021-10-09 DIAGNOSIS — L918 Other hypertrophic disorders of the skin: Secondary | ICD-10-CM

## 2021-10-09 DIAGNOSIS — L821 Other seborrheic keratosis: Secondary | ICD-10-CM

## 2021-10-09 DIAGNOSIS — L570 Actinic keratosis: Secondary | ICD-10-CM

## 2021-10-09 NOTE — Progress Notes (Signed)
Follow-Up Visit   Subjective  Mike Patel is a 61 y.o. male who presents for the following: Upper body skin exam (Hx of Melanoma spinal mid back, hx of BCC R lower lip, hx of Dysplastic nevus L mid back, R spinal mid back, R upper arm, Hx of AKs).  The patient presents for Upper Body Skin Exam (UBSE) for skin cancer screening and mole check.  The patient has spots, moles and lesions to be evaluated, some may be new or changing and the patient has concerns that these could be cancer.   The following portions of the chart were reviewed this encounter and updated as appropriate:       Review of Systems:  No other skin or systemic complaints except as noted in HPI or Assessment and Plan.  Objective  Well appearing patient in no apparent distress; mood and affect are within normal limits.  All skin waist up examined.  spinal mid back Well healed scar with no evidence of recurrence, no lymphadenopathy.   L spinal upper back; R paraspinal mid upper back  L spinal upper back 4.80m brown macule, lighter central  R paraspinal mid upper back 3.545mmed brown speckled macule  crown scalp x 6 (6) Pink scaly macules    Assessment & Plan   Lentigines - Scattered tan macules - Due to sun exposure - Benign-appearing, observe - Recommend daily broad spectrum sunscreen SPF 30+ to sun-exposed areas, reapply every 2 hours as needed. - Call for any changes - arms  Seborrheic Keratoses - Stuck-on, waxy, tan-brown papules and/or plaques  - Benign-appearing - Discussed benign etiology and prognosis. - Observe - Call for any changes - arms  Melanocytic Nevi - Tan-brown and/or pink-flesh-colored symmetric macules and papules - Benign appearing on exam today - Observation - Call clinic for new or changing moles - Recommend daily use of broad spectrum spf 30+ sunscreen to sun-exposed areas.  - back  Actinic Damage - Chronic condition, secondary to cumulative UV/sun exposure -  diffuse scaly erythematous macules with underlying dyspigmentation - Recommend daily broad spectrum sunscreen SPF 30+ to sun-exposed areas, reapply every 2 hours as needed.  - Staying in the shade or wearing long sleeves, sun glasses (UVA+UVB protection) and wide brim hats (4-inch brim around the entire circumference of the hat) are also recommended for sun protection.  - Call for new or changing lesions.  Skin cancer screening performed today.   History of Basal Cell Carcinoma of the Skin - No evidence of recurrence today - Recommend regular full body skin exams - Recommend daily broad spectrum sunscreen SPF 30+ to sun-exposed areas, reapply every 2 hours as needed.  - Call if any new or changing lesions are noted between office visits  - R lower lip  History of Dysplastic Nevi - No evidence of recurrence today - Recommend regular full body skin exams - Recommend daily broad spectrum sunscreen SPF 30+ to sun-exposed areas, reapply every 2 hours as needed.  - Call if any new or changing lesions are noted between office visits  - L mid back, R spinal mid back, R upper arm  Acrochordons (Skin Tags) - Fleshy, skin-colored pedunculated papules - Benign appearing.  - Observe. - If desired, they can be removed with an in office procedure that is not covered by insurance. - Please call the clinic if you notice any new or changing lesions.  - neck  History of melanoma spinal mid back  WLE 06/2019, 0.75m375mLevel II, [place] 1A  Clear. Observe for recurrence. Call clinic for new or changing lesions.  Recommend regular skin exams, daily broad-spectrum spf 30+ sunscreen use, and photoprotection.    Nevus (2) L spinal upper back; R paraspinal mid upper back  Benign-appearing.  Observation.  Call clinic for new or changing moles.  Recommend daily use of broad spectrum spf 30+ sunscreen to sun-exposed areas.    AK (actinic keratosis) (6) crown scalp x 6  Actinic keratoses are precancerous  spots that appear secondary to cumulative UV radiation exposure/sun exposure over time. They are chronic with expected duration over 1 year. A portion of actinic keratoses will progress to squamous cell carcinoma of the skin. It is not possible to reliably predict which spots will progress to skin cancer and so treatment is recommended to prevent development of skin cancer.  Recommend daily broad spectrum sunscreen SPF 30+ to sun-exposed areas, reapply every 2 hours as needed.  Recommend staying in the shade or wearing long sleeves, sun glasses (UVA+UVB protection) and wide brim hats (4-inch brim around the entire circumference of the hat). Call for new or changing lesions.   Destruction of lesion - crown scalp x 6  Destruction method: cryotherapy   Informed consent: discussed and consent obtained   Lesion destroyed using liquid nitrogen: Yes   Region frozen until ice ball extended beyond lesion: Yes   Outcome: patient tolerated procedure well with no complications   Post-procedure details: wound care instructions given   Additional details:  Prior to procedure, discussed risks of blister formation, small wound, skin dyspigmentation, or rare scar following cryotherapy. Recommend Vaseline ointment to treated areas while healing.    Return in about 6 months (around 04/09/2022) for UBSE, Hx of Melanoma, Hx of BCC, Hx of Dysplastic nevi, Hx of AKs.  I, Othelia Pulling, RMA, am acting as scribe for [name], MD .  Documentation: I have reviewed the above documentation for accuracy and completeness, and I agree with the above.  [name] MD

## 2021-10-09 NOTE — Patient Instructions (Addendum)
Cryotherapy Aftercare  Wash gently with soap and water everyday.   Apply Vaseline and Band-Aid daily until healed.     Due to recent changes in healthcare laws, you may see results of your pathology and/or laboratory studies on MyChart before the doctors have had a chance to review them. We understand that in some cases there may be results that are confusing or concerning to you. Please understand that not all results are received at the same time and often the doctors may need to interpret multiple results in order to provide you with the best plan of care or course of treatment. Therefore, we ask that you please give us 2 business days to thoroughly review all your results before contacting the office for clarification. Should we see a critical lab result, you will be contacted sooner.   If You Need Anything After Your Visit  If you have any questions or concerns for your doctor, please call our main line at [phone] and press option 4 to reach your doctor's medical assistant. If no one answers, please leave a voicemail as directed and we will return your call as soon as possible. Messages left after 4 pm will be answered the following business day.   You may also send us a message via MyChart. We typically respond to MyChart messages within 1-2 business days.  For prescription refills, please ask your pharmacy to contact our office. Our fax number is [phone].  If you have an urgent issue when the clinic is closed that cannot wait until the next business day, you can page your doctor at the number below.    Please note that while we do our best to be available for urgent issues outside of office hours, we are not available 24/7.   If you have an urgent issue and are unable to reach us, you may choose to seek medical care at your doctor's office, retail clinic, urgent care center, or emergency room.  If you have a medical emergency, please immediately call 911 or go to the  emergency department.  Pager Numbers  - Dr. Kowalski: [phone]  - Dr. Moye: [phone]  - Dr. Stewart: [phone]  In the event of inclement weather, please call our main line at [phone] for an update on the status of any delays or closures.  Dermatology Medication Tips: Please keep the boxes that topical medications come in in order to help keep track of the instructions about where and how to use these. Pharmacies typically print the medication instructions only on the boxes and not directly on the medication tubes.   If your medication is too expensive, please contact our office at [phone] option 4 or send us a message through MyChart.   We are unable to tell what your co-pay for medications will be in advance as this is different depending on your insurance coverage. However, we may be able to find a substitute medication at lower cost or fill out paperwork to get insurance to cover a needed medication.   If a prior authorization is required to get your medication covered by your insurance company, please allow us 1-2 business days to complete this process.  Drug prices often vary depending on where the prescription is filled and some pharmacies may offer cheaper prices.  The website www.goodrx.com contains coupons for medications through different pharmacies. The prices here do not account for what the cost may be with help from insurance (it may be cheaper with your insurance), but the website can   give you the price if you did not use any insurance.  - You can print the associated coupon and take it with your prescription to the pharmacy.  - You may also stop by our office during regular business hours and pick up a GoodRx coupon card.  - If you need your prescription sent electronically to a different pharmacy, notify our office through [facility] MyChart or by phone at [phone] option 4.     Si Usted Necesita Algo Despus de Su Visita  Tambin puede  enviarnos un mensaje a travs de MyChart. Por lo general respondemos a los mensajes de MyChart en el transcurso de 1 a 2 das hbiles.  Para renovar recetas, por favor pida a su farmacia que se ponga en contacto con nuestra oficina. Nuestro nmero de fax es el [phone].  Si tiene un asunto urgente cuando la clnica est cerrada y que no puede esperar hasta el siguiente da hbil, puede llamar/localizar a su doctor(a) al nmero que aparece a continuacin.   Por favor, tenga en cuenta que aunque hacemos todo lo posible para estar disponibles para asuntos urgentes fuera del horario de oficina, no estamos disponibles las 24 horas del da, los 7 das de la semana.   Si tiene un problema urgente y no puede comunicarse con nosotros, puede optar por buscar atencin mdica  en el consultorio de su doctor(a), en una clnica privada, en un centro de atencin urgente o en una sala de emergencias.  Si tiene una emergencia mdica, por favor llame inmediatamente al 911 o vaya a la sala de emergencias.  Nmeros de bper  - Dr. Kowalski: [phone]  - Dra. Moye: [phone]  - Dra. Stewart: [phone]  En caso de inclemencias del tiempo, por favor llame a nuestra lnea principal al [phone] para una actualizacin sobre el estado de cualquier retraso o cierre.  Consejos para la medicacin en dermatologa: Por favor, guarde las cajas en las que vienen los medicamentos de uso tpico para ayudarle a seguir las instrucciones sobre dnde y cmo usarlos. Las farmacias generalmente imprimen las instrucciones del medicamento slo en las cajas y no directamente en los tubos del medicamento.   Si su medicamento es muy caro, por favor, pngase en contacto con nuestra oficina llamando al [phone] y presione la opcin 4 o envenos un mensaje a travs de MyChart.   No podemos decirle cul ser su copago por los medicamentos por adelantado ya que esto es diferente dependiendo de la cobertura de su seguro.  Sin embargo, es posible que podamos encontrar un medicamento sustituto a menor costo o llenar un formulario para que el seguro cubra el medicamento que se considera necesario.   Si se requiere una autorizacin previa para que su compaa de seguros cubra su medicamento, por favor permtanos de 1 a 2 das hbiles para completar este proceso.  Los precios de los medicamentos varan con frecuencia dependiendo del lugar de dnde se surte la receta y alguna farmacias pueden ofrecer precios ms baratos.  El sitio web www.goodrx.com tiene cupones para medicamentos de diferentes farmacias. Los precios aqu no tienen en cuenta lo que podra costar con la ayuda del seguro (puede ser ms barato con su seguro), pero el sitio web puede darle el precio si no utiliz ningn seguro.  - Puede imprimir el cupn correspondiente y llevarlo con su receta a la farmacia.  - Tambin puede pasar por nuestra oficina durante el horario de atencin regular y recoger una tarjeta de cupones de GoodRx.  -   Si necesita que su receta se enve electrnicamente a una farmacia diferente, informe a nuestra oficina a travs de MyChart de [place] o por telfono llamando al [phone] y presione la opcin 4.  

## 2021-10-25 ENCOUNTER — Other Ambulatory Visit: Payer: Self-pay | Admitting: Urology

## 2021-11-02 ENCOUNTER — Other Ambulatory Visit: Payer: Self-pay | Admitting: Urology

## 2021-11-03 ENCOUNTER — Other Ambulatory Visit: Payer: Self-pay | Admitting: *Deleted

## 2021-11-03 ENCOUNTER — Telehealth: Payer: Self-pay | Admitting: Urology

## 2021-11-03 DIAGNOSIS — N5201 Erectile dysfunction due to arterial insufficiency: Secondary | ICD-10-CM

## 2021-11-03 MED ORDER — SILDENAFIL CITRATE 100 MG PO TABS: ORAL_TABLET | ORAL | 0 refills | Status: DC

## 2021-11-03 NOTE — Telephone Encounter (Signed)
Patient called the office today to request a prescription for Sildenafil '100mg'$ .  He states that it just works better than the tadalafil.    He would like it sent to the [facility] on [address].  Please advise.

## 2021-11-03 NOTE — Telephone Encounter (Signed)
Medication sent.

## 2021-12-11 ENCOUNTER — Other Ambulatory Visit: Payer: Self-pay | Admitting: Urology

## 2021-12-13 ENCOUNTER — Other Ambulatory Visit: Payer: Self-pay | Admitting: Urology

## 2021-12-20 ENCOUNTER — Other Ambulatory Visit: Payer: Self-pay | Admitting: Orthopedic Surgery

## 2021-12-20 DIAGNOSIS — M5416 Radiculopathy, lumbar region: Secondary | ICD-10-CM

## 2021-12-24 ENCOUNTER — Ambulatory Visit
Admission: RE | Admit: 2021-12-24 | Discharge: 2021-12-24 | Disposition: A | Discharge status: Home / Self Care | Payer: BC Managed Care – PPO | Source: Ambulatory Visit | Attending: Orthopedic Surgery | Admitting: Orthopedic Surgery

## 2021-12-24 DIAGNOSIS — M5416 Radiculopathy, lumbar region: Secondary | ICD-10-CM | POA: Diagnosis present

## 2022-01-01 ENCOUNTER — Other Ambulatory Visit: Payer: BC Managed Care – PPO

## 2022-02-06 ENCOUNTER — Other Ambulatory Visit: Payer: Self-pay | Admitting: Urology

## 2022-02-06 NOTE — Telephone Encounter (Signed)
Pt calling regarding medication Testosterone injections, running low, one shot left.  Please refill at [facility].  Please advise pt [phone].

## 2022-02-08 ENCOUNTER — Other Ambulatory Visit: Payer: Self-pay | Admitting: Urology

## 2022-02-09 MED ORDER — TESTOSTERONE CYPIONATE 200 MG/ML IM SOLN: 100.0000 mg | INTRAMUSCULAR | 0 refills | Status: DC

## 2022-03-12 ENCOUNTER — Encounter: Payer: Self-pay | Admitting: Urology

## 2022-03-12 ENCOUNTER — Ambulatory Visit (INDEPENDENT_AMBULATORY_CARE_PROVIDER_SITE_OTHER): Payer: BC Managed Care – PPO | Admitting: Urology

## 2022-03-12 VITALS — BP 154/73 | HR 77 | Ht 69.0 in | Wt 287.0 lb

## 2022-03-12 DIAGNOSIS — N400 Enlarged prostate without lower urinary tract symptoms: Secondary | ICD-10-CM | POA: Diagnosis not present

## 2022-03-12 DIAGNOSIS — N5201 Erectile dysfunction due to arterial insufficiency: Secondary | ICD-10-CM

## 2022-03-12 DIAGNOSIS — E291 Testicular hypofunction: Secondary | ICD-10-CM | POA: Diagnosis not present

## 2022-03-12 NOTE — Progress Notes (Unsigned)
03/12/2022 8:04 AM   Mike Patel [birth year] 62 [id number]  Referring provider: Maryland Pink, MD [address] [facility] [place],  [place] [address]  Chief Complaint  Patient presents with   Medication Refill    Urologic history: 1.  BPH TURP in October 2015  2.  Hypogonadism TRT testosterone cypionate  3.  Erectile dysfunction Sildenafil 50-100 mg  4..  History hematospermia  HPI: 62 y.o. male presents for annual follow-up.  Seen March 2023 to manage his hypogonadism previously done by Dr. Kary Kos Last injection was 2 days ago.  Annual labs have not been drawn Denies dysuria, gross hematuria No flank, abdominal or pelvic pain Feels sildenafil worked better than tadalafil for ED   PMH: Past Medical History:  Diagnosis Date   Aortic atherosclerosis ([place])    Arthritis    BACK   Basal cell carcinoma 05/19/2019   Right lower lip. Nodular and infiltrative.   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 Moderate atypia, limited margins free.   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 Moderate atypia, deep margin involved.   Dysplastic nevus 07/14/2019   Right upper arm. Moderate atypia, deep margin involved.    GERD (gastroesophageal reflux disease)    Gout    Hypertension    Melanemia    Melanoma ([place]) 06/29/2019   0.52m Level II,  spinal mid back [place], [place]   Obesity    Pre-diabetes    Sleep apnea    cpap    Surgical History: Past Surgical History:  Procedure Laterality Date   BACK SURGERY  2018   x3   CHOLECYSTECTOMY N/A 12/02/2017   Procedure: LAPAROSCOPIC CHOLECYSTECTOMY WITH INTRAOPERATIVE CHOLANGIOGRAM;  Surgeon: POlean Ree MD;  Location: [facility] ORS;  Service: General;  Laterality: N/A;   COLONOSCOPY N/A 05/24/2014   Procedure: COLONOSCOPY;  Surgeon: PHulen Luster MD;  Location: [facility] ENDOSCOPY;  Service: Gastroenterology;   Laterality: N/A;   COLONOSCOPY WITH PROPOFOL N/A 05/30/2020   Procedure: COLONOSCOPY WITH PROPOFOL;  Surgeon: [place], TBenay Pike MD;  Location: [facility] ENDOSCOPY;  Service: Gastroenterology;  Laterality: N/A;   ELBOW SURGERY Right    tennis elbow   ERCP N/A 11/18/2017   Procedure: ENDOSCOPIC RETROGRADE CHOLANGIOPANCREATOGRAPHY (ERCP);  Surgeon: WLucilla Lame MD;  [facility];  Service: Endoscopy;  Laterality: N/A;   ESOPHAGOGASTRODUODENOSCOPY N/A 05/24/2014   Procedure: ESOPHAGOGASTRODUODENOSCOPY (EGD);  Surgeon: PHulen Luster MD;  [facility];  Service: Gastroenterology;  Laterality: N/A;   PROSTATE SURGERY  2015   SPINE SURGERY     l-spine x 2   TOTAL HIP ARTHROPLASTY Right 12/19/2020   Procedure: TOTAL HIP ARTHROPLASTY ANTERIOR APPROACH;  Surgeon: BLovell Sheehan MD;  Location: [facility] ORS;  Service: Orthopedics;  Laterality: Right;    Home Medications:  Allergies as of 03/12/2022       Reactions   Percocet [oxycodone-acetaminophen] Itching   Tape Rash   Dermabond skin glue and Tape        Medication List        Accurate as of March 12, 2022  8:04 AM. If you have any questions, ask your nurse or doctor.          amLODipine 10 MG tablet Commonly known as: NORVASC Take 10 mg by mouth every morning.   aspirin EC 81 MG tablet Take by mouth.   cyanocobalamin 1000 MCG/ML injection Commonly known  as: VITAMIN B12 Inject 1,000 mcg into the muscle every 30 (thirty) days.   Fish Oil 1000 MG Caps Take 2,000 mg by mouth daily.   fluticasone 50 MCG/ACT nasal spray Commonly known as: FLONASE Place 2 sprays into both nostrils in the morning and at bedtime.   hydrochlorothiazide 12.5 MG tablet Commonly known as: HYDRODIURIL Take 12.5 mg by mouth daily.   lansoprazole 30 MG capsule Commonly known as: PREVACID Take 30 mg by mouth daily.   lisinopril 40 MG tablet Commonly known as: ZESTRIL Take 1 tablet (40 mg total) by mouth daily. What changed:  how much to take   Melatonin 10 MG Tabs Take 10 mg by mouth at bedtime as needed (sleep).   meloxicam 15 MG tablet Commonly known as: MOBIC Take 15 mg by mouth daily.   metFORMIN 500 MG 24 hr tablet Commonly known as: GLUCOPHAGE-XR Take 500 mg by mouth every evening.   metoprolol succinate 50 MG 24 hr tablet Commonly known as: TOPROL-XL Take 100 mg by mouth daily. Take with or immediately following a meal.   OneTouch Verio test strip Generic drug: glucose blood 1 each daily.   pravastatin 10 MG tablet Commonly known as: PRAVACHOL Take 10 mg by mouth at bedtime.   sildenafil 100 MG tablet Commonly known as: VIAGRA Take 1 tablet (100 mg total) by mouth daily as needed for erectile dysfunction.   testosterone cypionate 200 MG/ML injection Commonly known as: DEPOTESTOSTERONE CYPIONATE Inject 0.5 mLs (100 mg total) into the muscle every 7 (seven) days.   traZODone 50 MG tablet Commonly known as: DESYREL Take 50 mg by mouth at bedtime.   Vitamin D-3 125 MCG (5000 UT) Tabs Take 5,000 Units by mouth 2 (two) times daily.   zinc gluconate 50 MG tablet Take 50 mg by mouth daily.        Allergies:  Allergies  Allergen Reactions   Percocet [Oxycodone-Acetaminophen] Itching   Tape Rash    Dermabond skin glue and Tape    Family History: Family History  Problem Relation Age of Onset   Hypertension Mother     Social History:  reports that he has never smoked. He has never used smokeless tobacco. He reports that he does not drink alcohol and does not use drugs.   Physical Exam: BP (!) 154/73   Pulse 77   Ht 5' 9"$  (1.753 m)   Wt 287 lb (130.2 kg)   BMI 42.38 kg/m   Constitutional:  Alert and oriented, No acute distress. HEENT: [place] AT Skin: No rashes, bruises or suspicious lesions. Neurologic: Grossly intact, no focal deficits, moving all 4 extremities. Psychiatric: Normal mood and affect.   Assessment & Plan:    1. Benign prostatic hyperplasia without  LUTS Status post TURP 2015  2.  Erectile dysfunction Using sildenafil 50-100 mg.  Requested 20 mg Rx   3.  Hypogonadism PSA, testosterone, hematocrit drawn today Lab visit 6 months testosterone, hematocrit Office visit 1 year testosterone, PSA, hematocrit   Abbie Sons, [facility] [address], [place], [place] [address] [phone]

## 2022-03-13 LAB — TESTOSTERONE: Testosterone: 692 ng/dL (ref 264–916)

## 2022-03-13 LAB — HEMATOCRIT: Hematocrit: 53.5 % — ABNORMAL HIGH (ref 37.5–51.0)

## 2022-03-13 LAB — PSA: Prostate Specific Ag, Serum: 1.9 ng/mL (ref 0.0–4.0)

## 2022-03-13 MED ORDER — SILDENAFIL CITRATE 20 MG PO TABS: ORAL_TABLET | ORAL | 0 refills | Status: DC

## 2022-03-14 ENCOUNTER — Encounter: Payer: Self-pay | Admitting: *Deleted

## 2022-03-16 ENCOUNTER — Other Ambulatory Visit: Payer: Self-pay | Admitting: Urology

## 2022-03-16 MED ORDER — SILDENAFIL CITRATE 20 MG PO TABS: ORAL_TABLET | ORAL | 0 refills | Status: DC

## 2022-03-20 ENCOUNTER — Other Ambulatory Visit: Payer: Self-pay

## 2022-03-20 ENCOUNTER — Emergency Department: Payer: BC Managed Care – PPO

## 2022-03-20 ENCOUNTER — Encounter: Payer: Self-pay | Admitting: Emergency Medicine

## 2022-03-20 ENCOUNTER — Observation Stay
Admission: EM | Admit: 2022-03-20 | Discharge: 2022-03-21 | Disposition: A | Discharge status: Home / Self Care | Payer: BC Managed Care – PPO | Attending: Internal Medicine | Admitting: Internal Medicine

## 2022-03-20 DIAGNOSIS — R197 Diarrhea, unspecified: Secondary | ICD-10-CM | POA: Diagnosis present

## 2022-03-20 DIAGNOSIS — Z7982 Long term (current) use of aspirin: Secondary | ICD-10-CM | POA: Insufficient documentation

## 2022-03-20 DIAGNOSIS — J9601 Acute respiratory failure with hypoxia: Secondary | ICD-10-CM

## 2022-03-20 DIAGNOSIS — R112 Nausea with vomiting, unspecified: Secondary | ICD-10-CM

## 2022-03-20 DIAGNOSIS — Z79899 Other long term (current) drug therapy: Secondary | ICD-10-CM | POA: Insufficient documentation

## 2022-03-20 DIAGNOSIS — Z6838 Body mass index (BMI) 38.0-38.9, adult: Secondary | ICD-10-CM | POA: Diagnosis not present

## 2022-03-20 DIAGNOSIS — A419 Sepsis, unspecified organism: Secondary | ICD-10-CM | POA: Diagnosis not present

## 2022-03-20 DIAGNOSIS — R6510 Systemic inflammatory response syndrome (SIRS) of non-infectious origin without acute organ dysfunction: Secondary | ICD-10-CM | POA: Diagnosis present

## 2022-03-20 DIAGNOSIS — G4733 Obstructive sleep apnea (adult) (pediatric): Secondary | ICD-10-CM

## 2022-03-20 DIAGNOSIS — R109 Unspecified abdominal pain: Secondary | ICD-10-CM | POA: Diagnosis present

## 2022-03-20 DIAGNOSIS — A0811 Acute gastroenteropathy due to Norwalk agent: Secondary | ICD-10-CM | POA: Diagnosis not present

## 2022-03-20 DIAGNOSIS — Z7952 Long term (current) use of systemic steroids: Secondary | ICD-10-CM | POA: Insufficient documentation

## 2022-03-20 DIAGNOSIS — E669 Obesity, unspecified: Secondary | ICD-10-CM | POA: Diagnosis present

## 2022-03-20 DIAGNOSIS — Z85828 Personal history of other malignant neoplasm of skin: Secondary | ICD-10-CM | POA: Diagnosis not present

## 2022-03-20 DIAGNOSIS — Z96641 Presence of right artificial hip joint: Secondary | ICD-10-CM | POA: Diagnosis not present

## 2022-03-20 DIAGNOSIS — I10 Essential (primary) hypertension: Secondary | ICD-10-CM | POA: Diagnosis present

## 2022-03-20 DIAGNOSIS — Z1152 Encounter for screening for COVID-19: Secondary | ICD-10-CM | POA: Insufficient documentation

## 2022-03-20 DIAGNOSIS — E119 Type 2 diabetes mellitus without complications: Secondary | ICD-10-CM

## 2022-03-20 DIAGNOSIS — Z7984 Long term (current) use of oral hypoglycemic drugs: Secondary | ICD-10-CM | POA: Insufficient documentation

## 2022-03-20 DIAGNOSIS — E785 Hyperlipidemia, unspecified: Secondary | ICD-10-CM | POA: Diagnosis present

## 2022-03-20 HISTORY — DX: Nausea with vomiting, unspecified: R11.2

## 2022-03-20 LAB — CBC WITH DIFFERENTIAL/PLATELET
Abs Immature Granulocytes: 0.05 10*3/uL (ref 0.00–0.07)
Basophils Absolute: 0 10*3/uL (ref 0.0–0.1)
Basophils Relative: 0 %
Eosinophils Absolute: 0 10*3/uL (ref 0.0–0.5)
Eosinophils Relative: 0 %
HCT: 49.9 % (ref 39.0–52.0)
Hemoglobin: 16.6 g/dL (ref 13.0–17.0)
Immature Granulocytes: 0 %
Lymphocytes Relative: 4 %
Lymphs Abs: 0.5 10*3/uL — ABNORMAL LOW (ref 0.7–4.0)
MCH: 31.4 pg (ref 26.0–34.0)
MCHC: 33.3 g/dL (ref 30.0–36.0)
MCV: 94.3 fL (ref 80.0–100.0)
Monocytes Absolute: 0.7 10*3/uL (ref 0.1–1.0)
Monocytes Relative: 6 %
Neutro Abs: 10.7 10*3/uL — ABNORMAL HIGH (ref 1.7–7.7)
Neutrophils Relative %: 90 %
Platelets: 223 10*3/uL (ref 150–400)
RBC: 5.29 MIL/uL (ref 4.22–5.81)
RDW: 12.7 % (ref 11.5–15.5)
WBC: 12 10*3/uL — ABNORMAL HIGH (ref 4.0–10.5)
nRBC: 0 % (ref 0.0–0.2)

## 2022-03-20 LAB — COMPREHENSIVE METABOLIC PANEL
ALT: 25 U/L (ref 0–44)
AST: 26 U/L (ref 15–41)
Albumin: 4.1 g/dL (ref 3.5–5.0)
Alkaline Phosphatase: 50 U/L (ref 38–126)
Anion gap: 12 (ref 5–15)
BUN: 25 mg/dL — ABNORMAL HIGH (ref 8–23)
CO2: 23 mmol/L (ref 22–32)
Calcium: 8.8 mg/dL — ABNORMAL LOW (ref 8.9–10.3)
Chloride: 100 mmol/L (ref 98–111)
Creatinine, Ser: 0.99 mg/dL (ref 0.61–1.24)
GFR, Estimated: >60 mL/min (ref >60)
Glucose, Bld: 277 mg/dL — ABNORMAL HIGH (ref 70–99)
Potassium: 4 mmol/L (ref 3.5–5.1)
Sodium: 135 mmol/L (ref 135–145)
Total Bilirubin: 1.5 mg/dL — ABNORMAL HIGH (ref 0.3–1.2)
Total Protein: 6.7 g/dL (ref 6.5–8.1)

## 2022-03-20 LAB — URINALYSIS, ROUTINE W REFLEX MICROSCOPIC
Bacteria, UA: NONE SEEN
Bilirubin Urine: NEGATIVE
Glucose, UA: >=500 mg/dL — AB
Hgb urine dipstick: NEGATIVE
Ketones, ur: 20 mg/dL — AB
Leukocytes,Ua: NEGATIVE
Nitrite: NEGATIVE
Protein, ur: NEGATIVE mg/dL
Specific Gravity, Urine: 1.028 (ref 1.005–1.030)
Squamous Epithelial / HPF: NONE SEEN /HPF (ref 0–5)
pH: 5 (ref 5.0–8.0)

## 2022-03-20 LAB — RESPIRATORY PANEL BY PCR

## 2022-03-20 LAB — GASTROINTESTINAL PANEL BY PCR, STOOL (REPLACES STOOL CULTURE)

## 2022-03-20 LAB — PROTIME-INR
INR: 1.1 (ref 0.8–1.2)
Prothrombin Time: 13.8 seconds (ref 11.4–15.2)

## 2022-03-20 LAB — GLUCOSE, CAPILLARY
Glucose-Capillary: 166 mg/dL — ABNORMAL HIGH (ref 70–99)
Glucose-Capillary: 183 mg/dL — ABNORMAL HIGH (ref 70–99)
Glucose-Capillary: 196 mg/dL — ABNORMAL HIGH (ref 70–99)

## 2022-03-20 LAB — LACTIC ACID, PLASMA
Lactic Acid, Venous: 3.4 mmol/L (ref 0.5–1.9)
Lactic Acid, Venous: 2.4 mmol/L (ref 0.5–1.9)
Lactic Acid, Venous: 1.5 mmol/L (ref 0.5–1.9)
Lactic Acid, Venous: 1.6 mmol/L (ref 0.5–1.9)

## 2022-03-20 LAB — RESP PANEL BY RT-PCR (RSV, FLU A&B, COVID)  RVPGX2
Influenza A by PCR: NEGATIVE
Influenza B by PCR: NEGATIVE
Resp Syncytial Virus by PCR: NEGATIVE
SARS Coronavirus 2 by RT PCR: NEGATIVE

## 2022-03-20 LAB — LIPASE, BLOOD: Lipase: 42 U/L (ref 11–51)

## 2022-03-20 LAB — HIV ANTIBODY (ROUTINE TESTING W REFLEX): HIV Screen 4th Generation wRfx: NONREACTIVE

## 2022-03-20 LAB — C DIFFICILE QUICK SCREEN W PCR REFLEX
C Diff antigen: NEGATIVE
C Diff interpretation: NOT DETECTED
C Diff toxin: NEGATIVE

## 2022-03-20 LAB — PROCALCITONIN: Procalcitonin: 0.2 ng/mL

## 2022-03-20 MED ORDER — ALBUTEROL SULFATE (2.5 MG/3ML) 0.083% IN NEBU: 2.5000 mg | INHALATION_SOLUTION | RESPIRATORY_TRACT | Status: DC | PRN

## 2022-03-20 MED ORDER — OMEGA-3-ACID ETHYL ESTERS 1 G PO CAPS
1.0000 g | ORAL_CAPSULE | Freq: Every day | ORAL | Status: DC
  Administered 2022-03-20 – 2022-03-21 (×2): 1 g via ORAL
  Filled 2022-03-20 (×2): qty 1

## 2022-03-20 MED ORDER — PRAVASTATIN SODIUM 20 MG PO TABS
10.0000 mg | ORAL_TABLET | Freq: Every day | ORAL | Status: DC
  Administered 2022-03-20: 10 mg via ORAL
  Filled 2022-03-20: qty 1

## 2022-03-20 MED ORDER — SODIUM CHLORIDE 0.9 % IV BOLUS
1000.0000 mL | Freq: Once | INTRAVENOUS | Status: AC
  Administered 2022-03-20: 1000 mL via INTRAVENOUS

## 2022-03-20 MED ORDER — LACTATED RINGERS IV SOLN: INTRAVENOUS | Status: AC

## 2022-03-20 MED ORDER — KETOROLAC TROMETHAMINE 30 MG/ML IJ SOLN
15.0000 mg | Freq: Once | INTRAMUSCULAR | Status: AC
  Administered 2022-03-20: 15 mg via INTRAVENOUS
  Filled 2022-03-20: qty 1

## 2022-03-20 MED ORDER — ONDANSETRON HCL 4 MG/2ML IJ SOLN: 4.0000 mg | Freq: Three times a day (TID) | INTRAMUSCULAR | Status: DC | PRN

## 2022-03-20 MED ORDER — HYDROCODONE-ACETAMINOPHEN 5-325 MG PO TABS
1.0000 | ORAL_TABLET | Freq: Four times a day (QID) | ORAL | Status: DC | PRN
  Administered 2022-03-20: 1 via ORAL
  Filled 2022-03-20: qty 1

## 2022-03-20 MED ORDER — ORAL CARE MOUTH RINSE: 15.0000 mL | OROMUCOSAL | Status: DC | PRN

## 2022-03-20 MED ORDER — VANCOMYCIN HCL IN DEXTROSE 1-5 GM/200ML-% IV SOLN
1000.0000 mg | Freq: Once | INTRAVENOUS | Status: AC
  Administered 2022-03-20: 1000 mg via INTRAVENOUS
  Filled 2022-03-20: qty 200

## 2022-03-20 MED ORDER — METRONIDAZOLE 500 MG/100ML IV SOLN
500.0000 mg | Freq: Once | INTRAVENOUS | Status: AC
  Administered 2022-03-20: 500 mg via INTRAVENOUS
  Filled 2022-03-20: qty 100

## 2022-03-20 MED ORDER — GABAPENTIN 300 MG PO CAPS
300.0000 mg | ORAL_CAPSULE | Freq: Three times a day (TID) | ORAL | Status: DC | PRN
  Administered 2022-03-20: 300 mg via ORAL
  Filled 2022-03-20: qty 1

## 2022-03-20 MED ORDER — MELATONIN 5 MG PO TABS: 10.0000 mg | ORAL_TABLET | Freq: Every evening | ORAL | Status: DC | PRN

## 2022-03-20 MED ORDER — IOHEXOL 300 MG/ML  SOLN
100.0000 mL | Freq: Once | INTRAMUSCULAR | Status: AC | PRN
  Administered 2022-03-20: 100 mL via INTRAVENOUS

## 2022-03-20 MED ORDER — SODIUM CHLORIDE 0.9 % IV BOLUS
500.0000 mL | Freq: Once | INTRAVENOUS | Status: AC
  Administered 2022-03-20: 500 mL via INTRAVENOUS

## 2022-03-20 MED ORDER — TRAZODONE HCL 50 MG PO TABS
50.0000 mg | ORAL_TABLET | Freq: Every day | ORAL | Status: DC
  Administered 2022-03-20: 50 mg via ORAL
  Filled 2022-03-20: qty 1

## 2022-03-20 MED ORDER — ASPIRIN 81 MG PO TBEC
81.0000 mg | DELAYED_RELEASE_TABLET | Freq: Every day | ORAL | Status: DC
  Administered 2022-03-20 – 2022-03-21 (×2): 81 mg via ORAL
  Filled 2022-03-20 (×2): qty 1

## 2022-03-20 MED ORDER — ACETAMINOPHEN 325 MG PO TABS
650.0000 mg | ORAL_TABLET | Freq: Four times a day (QID) | ORAL | Status: DC | PRN
  Administered 2022-03-20: 650 mg via ORAL
  Filled 2022-03-20: qty 2

## 2022-03-20 MED ORDER — DM-GUAIFENESIN ER 30-600 MG PO TB12: 1.0000 | ORAL_TABLET | Freq: Two times a day (BID) | ORAL | Status: DC | PRN

## 2022-03-20 MED ORDER — ACETAMINOPHEN 500 MG PO TABS
1000.0000 mg | ORAL_TABLET | Freq: Once | ORAL | Status: AC
  Administered 2022-03-20: 1000 mg via ORAL
  Filled 2022-03-20: qty 2

## 2022-03-20 MED ORDER — INSULIN ASPART 100 UNIT/ML IJ SOLN: 0.0000 [IU] | Freq: Every day | INTRAMUSCULAR | Status: DC

## 2022-03-20 MED ORDER — SODIUM CHLORIDE 0.9 % IV SOLN
2.0000 g | Freq: Once | INTRAVENOUS | Status: AC
  Administered 2022-03-20: 2 g via INTRAVENOUS
  Filled 2022-03-20: qty 12.5

## 2022-03-20 MED ORDER — ALBUTEROL SULFATE HFA 108 (90 BASE) MCG/ACT IN AERS: 2.0000 | INHALATION_SPRAY | RESPIRATORY_TRACT | Status: DC | PRN

## 2022-03-20 MED ORDER — ZINC SULFATE 220 (50 ZN) MG PO CAPS
220.0000 mg | ORAL_CAPSULE | Freq: Every day | ORAL | Status: DC
  Administered 2022-03-20 – 2022-03-21 (×2): 220 mg via ORAL
  Filled 2022-03-20 (×2): qty 1

## 2022-03-20 MED ORDER — ONDANSETRON HCL 4 MG/2ML IJ SOLN
4.0000 mg | Freq: Once | INTRAMUSCULAR | Status: AC
  Administered 2022-03-20: 4 mg via INTRAVENOUS
  Filled 2022-03-20: qty 2

## 2022-03-20 MED ORDER — FENTANYL CITRATE PF 50 MCG/ML IJ SOSY
25.0000 ug | PREFILLED_SYRINGE | INTRAMUSCULAR | Status: DC | PRN
  Administered 2022-03-20: 25 ug via INTRAVENOUS
  Filled 2022-03-20: qty 1

## 2022-03-20 MED ORDER — PANTOPRAZOLE SODIUM 20 MG PO TBEC
20.0000 mg | DELAYED_RELEASE_TABLET | Freq: Every day | ORAL | Status: DC
  Administered 2022-03-20 – 2022-03-21 (×2): 20 mg via ORAL
  Filled 2022-03-20 (×2): qty 1

## 2022-03-20 MED ORDER — INSULIN ASPART 100 UNIT/ML IJ SOLN
0.0000 [IU] | Freq: Three times a day (TID) | INTRAMUSCULAR | Status: DC
  Administered 2022-03-20 – 2022-03-21 (×3): 2 [IU] via SUBCUTANEOUS
  Filled 2022-03-20 (×3): qty 1

## 2022-03-20 MED ORDER — ENOXAPARIN SODIUM 80 MG/0.8ML IJ SOSY
65.0000 mg | PREFILLED_SYRINGE | INTRAMUSCULAR | Status: DC
  Administered 2022-03-20: 65 mg via SUBCUTANEOUS
  Filled 2022-03-20: qty 0.65

## 2022-03-20 MED ORDER — LACTATED RINGERS IV BOLUS
1000.0000 mL | Freq: Once | INTRAVENOUS | Status: AC
  Administered 2022-03-20: 1000 mL via INTRAVENOUS

## 2022-03-20 MED ORDER — VITAMIN D 25 MCG (1000 UNIT) PO TABS
5000.0000 [IU] | ORAL_TABLET | Freq: Two times a day (BID) | ORAL | Status: DC
  Administered 2022-03-20 – 2022-03-21 (×3): 5000 [IU] via ORAL
  Filled 2022-03-20 (×3): qty 5

## 2022-03-20 NOTE — Progress Notes (Signed)
Notified provider and bedside nurse of need to order and administer fluid bolus, pt needs 3912 cc.

## 2022-03-20 NOTE — ED Notes (Signed)
Pt. O2 sat dropped to 85%, oxygen returned to mid 90's % shortly after.

## 2022-03-20 NOTE — Progress Notes (Signed)
PHARMACIST - PHYSICIAN COMMUNICATION  CONCERNING:  Enoxaparin (Lovenox) for DVT Prophylaxis    RECOMMENDATION: Patient was prescribed enoxaprin '40mg'$  q24 hours for VTE prophylaxis.   Filed Weights   03/20/22 0558 03/20/22 0645  Weight: 129.3 kg (285 lb) 130.4 kg (287 lb 7.7 oz)    Body mass index is 38.99 kg/m.  Estimated Creatinine Clearance: 108 mL/min (by C-G formula based on SCr of 0.99 mg/dL).   Based on [place] patient is candidate for enoxaparin 0.'5mg'$ /kg TBW SQ every 24 hours based on BMI being >30.  DESCRIPTION: Pharmacy has adjusted enoxaparin dose per [facility] policy.  Patient is now receiving enoxaparin 65 mg every 24 hours    Dorothe Pea, PharmD, BCPS Clinical Pharmacist   03/20/2022 11:12 AM

## 2022-03-20 NOTE — ED Triage Notes (Signed)
Patient ambulatory to triage with steady gait, without difficulty or distress noted; pt reports since last night having lower abd pain, diarrhea, fever & chills

## 2022-03-20 NOTE — Progress Notes (Signed)
Notified bedside nurse of need to draw repeat lactic acid and give the other liter of fluids that are ordered for the protocol.

## 2022-03-20 NOTE — ED Notes (Signed)
ED Provider at bedside. 

## 2022-03-20 NOTE — H&P (Addendum)
History and Physical    Mike Patel [id number] DOB: 15-Jun-1960 DOA: 03/20/2022  Referring MD/NP/PA:   PCP: Maryland Pink, MD   Patient coming from:  The patient is coming from home.    Chief Complaint: Fever, chills, nausea, vomiting, diarrhea, abdominal pain  HPI: [name] is a 63 y.o. male with medical history significant of hypertension, hyperlipidemia, diabetes mellitus, gout, BPH, OSA on CPAP, chronic pancreatitis, who presents with fever, chills, nausea, vomiting, diarrhea and abdominal pain.  Patient states that he was at his mother's funeral over the weekend.  He was around many people.  In the past 2 days, he developed fever, chills, nausea, vomiting, diarrhea and central abdominal pain.  His abdominal pain was mild, which has resolved.  He has had 4-5 times of watery diarrhea, which has improved today.  Patient has nausea and vomited once or twice, with nonbilious nonbloody vomiting.  Patient reported to ED physician that he had some mild shortness breath, but he denies cough, shortness breath, chest pain to me.  He has generalized weakness.  No symptoms of UTI. Per report, patient had oxygen desaturation to 86% on ambulation, currently his saturation is 97% on room air when I saw patient in ED.  Data reviewed independently and ED Course: pt was found to have WBC 12.0, lactic acid 3.4, negative PCR for COVID, flu and RSV, GFR> 60, temperature 100.1, blood pressure 101/78, 114/77, heart rate 121, 107, RR 28, oxygen saturation 97% on room air.  Chest x-ray negative.  CT of abdomen/pelvis is negative for acute intra-abdominal issues.  CT of chest is negative for acute issues.  Patient is admitted to telemetry bed as inpatient   EKG: I have personally reviewed.  Sinus rhythm, QTc 420, LAE, early R wave progression   Review of Systems:   General: no fevers, chills, no body weight gain, has poor appetite, has fatigue HEENT: no blurry vision, hearing changes or sore  throat Respiratory: no dyspnea, coughing, wheezing CV: no chest pain, no palpitations GI: has nausea, vomiting, abdominal pain, diarrhea GU: no dysuria, burning on urination, increased urinary frequency, hematuria  Ext: no leg edema Neuro: no unilateral weakness, numbness, or tingling, no vision change or hearing loss Skin: no rash, no skin tear. MSK: No muscle spasm, no deformity, no limitation of range of movement in spin Heme: No easy bruising.  Travel history: No recent long distant travel.   Allergy:  Allergies  Allergen Reactions   Percocet [Oxycodone-Acetaminophen] Itching   Tape Rash    Dermabond skin glue and Tape    Past Medical History:  Diagnosis Date   Aortic atherosclerosis ([place])    Arthritis    BACK   Basal cell carcinoma 05/19/2019   Right lower lip. Nodular and infiltrative.   DDD (degenerative disc disease), cervical    Diabetes mellitus without complication ([place])    Dysplastic nevus 07/14/2019   Left mid back. Moderate atypia, limited margins free.   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 Moderate atypia, deep margin involved.   Dysplastic nevus 07/14/2019   Right upper arm. Moderate atypia, deep margin involved.    GERD (gastroesophageal reflux disease)    Gout    Hypertension    Melanemia    Melanoma ([place]) 06/29/2019   0.85m Level II,  spinal mid back WLE, [place]   Nausea vomiting and diarrhea 03/20/2022   Obesity    Pre-diabetes    Sleep apnea    cpap    Past  Surgical History:  Procedure Laterality Date   BACK SURGERY  2018   x3   CHOLECYSTECTOMY N/A 12/02/2017   Procedure: LAPAROSCOPIC CHOLECYSTECTOMY WITH INTRAOPERATIVE CHOLANGIOGRAM;  Surgeon: Olean Ree, MD;  Location: [facility] ORS;  Service: General;  Laterality: N/A;   COLONOSCOPY N/A 05/24/2014   Procedure: COLONOSCOPY;  Surgeon: Hulen Luster, MD;  Location: [facility] ENDOSCOPY;  Service: Gastroenterology;  Laterality: N/A;   COLONOSCOPY WITH PROPOFOL N/A 05/30/2020    Procedure: COLONOSCOPY WITH PROPOFOL;  Surgeon: Toledo, Benay Pike, MD;  Location: [facility] ENDOSCOPY;  Service: Gastroenterology;  Laterality: N/A;   ELBOW SURGERY Right    tennis elbow   ERCP N/A 11/18/2017   Procedure: ENDOSCOPIC RETROGRADE CHOLANGIOPANCREATOGRAPHY (ERCP);  Surgeon: Lucilla Lame, MD;  Location[facility] ENDOSCOPY;  Service: Endoscopy;  Laterality: N/A;   ESOPHAGOGASTRODUODENOSCOPY N/A 05/24/2014   Procedure: ESOPHAGOGASTRODUODENOSCOPY (EGD);  Surgeon: Hulen Luster, MD;  Location: [facility] ENDOSCOPY;  Service: Gastroenterology;  Laterality: N/A;   PROSTATE SURGERY  2015   SPINE SURGERY     l-spine x 2   TOTAL HIP ARTHROPLASTY Right 12/19/2020   Procedure: TOTAL HIP ARTHROPLASTY ANTERIOR APPROACH;  Surgeon: Lovell Sheehan, MD;  Location: [facility] ORS;  Service: Orthopedics;  Laterality: Right;    Social History:  reports that he has never smoked. He has never used smokeless tobacco. He reports that he does not drink alcohol and does not use drugs.  Family History:  Family History  Problem Relation Age of Onset   Hypertension Mother      Prior to Admission medications   Medication Sig Start Date End Date Taking? Authorizing Provider  gabapentin (NEURONTIN) 300 MG capsule Take 300 mg by mouth 3 (three) times daily as needed. 12/08/21  Yes [provider]  amLODipine (NORVASC) 10 MG tablet Take 10 mg by mouth every morning.     [provider]  aspirin EC 81 MG tablet Take by mouth.    [provider]  Cholecalciferol (VITAMIN D-3) 125 MCG (5000 UT) TABS Take 5,000 Units by mouth 2 (two) times daily.    [provider]  cyanocobalamin (,VITAMIN B-12,) 1000 MCG/ML injection Inject 1,000 mcg into the muscle every 30 (thirty) days. 11/27/20   [provider]  fluticasone (FLONASE) 50 MCG/ACT nasal spray Place 2 sprays into both nostrils in the morning and at bedtime.    [provider]  hydrochlorothiazide (HYDRODIURIL) 12.5 MG tablet Take  12.5 mg by mouth daily. 02/01/20   [provider]  HYDROcodone-acetaminophen (NORCO/VICODIN) 5-325 MG tablet Take 1 tablet by mouth every 6 (six) hours as needed.    [provider]  lansoprazole (PREVACID) 30 MG capsule Take 30 mg by mouth daily.    [provider]  lisinopril (PRINIVIL,ZESTRIL) 40 MG tablet Take 1 tablet (40 mg total) by mouth daily. Patient taking differently: Take 80 mg by mouth daily. 11/19/17   Mayo, Pete Pelt, MD  Melatonin 10 MG TABS Take 10 mg by mouth at bedtime as needed (sleep).    [provider]  meloxicam (MOBIC) 15 MG tablet Take 15 mg by mouth daily.    [provider]  metFORMIN (GLUCOPHAGE-XR) 500 MG 24 hr tablet Take 500 mg by mouth every evening. 04/17/19   [provider]  metoprolol succinate (TOPROL-XL) 50 MG 24 hr tablet Take 100 mg by mouth daily. Take with or immediately following a meal.    [provider]  Omega-3 Fatty Acids (FISH OIL) 1000 MG CAPS Take 2,000 mg by mouth daily.  [provider]  [facility] VERIO test strip 1 each daily. 06/14/21   [provider]  pravastatin (PRAVACHOL) 10 MG tablet Take 10 mg by mouth at bedtime. 01/22/20   [provider]  sildenafil (REVATIO) 20 MG tablet 2-3 tabs 1 hour prior to intercourse 03/16/22   Stoioff, Ronda Fairly, MD  testosterone cypionate (DEPOTESTOSTERONE CYPIONATE) 200 MG/ML injection Inject 0.5 mLs (100 mg total) into the muscle every 7 (seven) days. 02/09/22   Stoioff, Ronda Fairly, MD  traZODone (DESYREL) 50 MG tablet Take 50 mg by mouth at bedtime. 09/19/20   [provider]  zinc gluconate 50 MG tablet Take 50 mg by mouth daily.    [provider]    Physical Exam: Vitals:   03/20/22 1100 03/20/22 1118 03/20/22 1130 03/20/22 1203  BP:  135/70 136/66 112/74  Pulse: (!) 112 (!) 109 (!) 111 (!) 105  Resp: (!) 30 18 (!) 24 18  Temp:  98.4 F (36.9 C)  98.4 F (36.9 C)  TempSrc:      SpO2: 93% 95%  96% 91%  Weight:    130.4 kg  Height:    6' (1.829 m)   General: Not in acute distress HEENT:       Eyes: PERRL, EOMI, no scleral icterus.       ENT: No discharge from the ears and nose, no pharynx injection, no tonsillar enlargement.        Neck: No JVD, no bruit, no mass felt. Heme: No neck lymph node enlargement. Cardiac: S1/S2, RRR, No murmurs, No gallops or rubs. Respiratory: No rales, wheezing, rhonchi or rubs. GI: Soft, nondistended, nontender, no rebound pain, no organomegaly, BS present. GU: No hematuria Ext: No pitting leg edema bilaterally. 1+DP/PT pulse bilaterally. Musculoskeletal: No joint deformities, No joint redness or warmth, no limitation of ROM in spin. Skin: No rashes.  Neuro: Alert, oriented X3, cranial nerves II-XII grossly intact, moves all extremities normally.  Psych: Patient is not psychotic, no suicidal or hemocidal ideation.  Labs on Admission: I have personally reviewed following labs and imaging studies  CBC: Recent Labs  Lab 03/20/22 0559  WBC 12.0*  NEUTROABS 10.7*  HGB 16.6  HCT 49.9  MCV 94.3  PLT [id number]   Basic Metabolic Panel: Recent Labs  Lab 03/20/22 0559  NA 135  K 4.0  CL 100  CO2 23  GLUCOSE 277*  BUN 25*  CREATININE 0.99  CALCIUM 8.8*   GFR: Estimated Creatinine Clearance: 108 mL/min (by C-G formula based on SCr of 0.99 mg/dL). Liver Function Tests: Recent Labs  Lab 03/20/22 0559  AST 26  ALT 25  ALKPHOS 50  BILITOT 1.5*  PROT 6.7  ALBUMIN 4.1   Recent Labs  Lab 03/20/22 0559  LIPASE 42   No results for input(s): "AMMONIA" in the last 168 hours. Coagulation Profile: Recent Labs  Lab 03/20/22 1219  INR 1.1   Cardiac Enzymes: No results for input(s): "CKTOTAL", "CKMB", "CKMBINDEX", "TROPONINI" in the last 168 hours. BNP (last 3 results) No results for input(s): "PROBNP" in the last 8760 hours. HbA1C: No results for input(s): "HGBA1C" in the last 72 hours. CBG: Recent Labs  Lab 03/20/22 1158  GLUCAP  166*   Lipid Profile: No results for input(s): "CHOL", "HDL", "LDLCALC", "TRIG", "CHOLHDL", "LDLDIRECT" in the last 72 hours. Thyroid Function Tests: No results for input(s): "TSH", "T4TOTAL", "FREET4", "T3FREE", "THYROIDAB" in the last 72 hours. Anemia Panel: No results for input(s): "VITAMINB12", "FOLATE", "FERRITIN", "TIBC", "IRON", "RETICCTPCT" in the last  72 hours. Urine analysis:    Component Value Date/Time   COLORURINE YELLOW (A) 03/20/2022 0601   APPEARANCEUR CLEAR (A) 03/20/2022 0601   APPEARANCEUR Clear 07/05/2021 1324   LABSPEC 1.028 03/20/2022 0601   PHURINE 5.0 03/20/2022 0601   GLUCOSEU >=500 (A) 03/20/2022 0601   HGBUR NEGATIVE 03/20/2022 0601   BILIRUBINUR NEGATIVE 03/20/2022 0601   BILIRUBINUR Negative 07/05/2021 1324   KETONESUR 20 (A) 03/20/2022 0601   PROTEINUR NEGATIVE 03/20/2022 0601   NITRITE NEGATIVE 03/20/2022 0601   LEUKOCYTESUR NEGATIVE 03/20/2022 0601   Sepsis Labs: '@LABRCNTIP'$ (procalcitonin:4,lacticidven:4) ) Recent Results (from the past 240 hour(s))  Resp panel by RT-PCR (RSV, Flu A&B, Covid) Anterior Nasal Swab     Status: None   Collection Time: 03/20/22  5:58 AM   Specimen: Anterior Nasal Swab  Result Value Ref Range Status   SARS Coronavirus 2 by RT PCR NEGATIVE NEGATIVE Final    Comment: (NOTE) SARS-CoV-2 target nucleic acids are NOT DETECTED.  The SARS-CoV-2 RNA is generally detectable in upper respiratory specimens during the acute phase of infection. The lowest concentration of SARS-CoV-2 viral copies this assay can detect is 138 copies/mL. A negative result does not preclude SARS-Cov-2 infection and should not be used as the sole basis for treatment or other patient management decisions. A negative result may occur with  improper specimen collection/handling, submission of specimen other than nasopharyngeal swab, presence of viral mutation(s) within the areas targeted by this assay, and inadequate number of viral copies(<138  copies/mL). A negative result must be combined with clinical observations, patient history, and epidemiological information. The expected result is Negative.  Fact Sheet for Patients:  EntrepreneurPulse.com.au  Fact Sheet for Healthcare Providers:  IncredibleEmployment.be  This test is no t yet approved or cleared by the [place] FDA and  has been authorized for detection and/or diagnosis of SARS-CoV-2 by FDA under an Emergency Use Authorization (EUA). This EUA will remain  in effect (meaning this test can be used) for the duration of the COVID-19 declaration under Section 564(b)(1) of the Act, 21 U.S.C.section 360bbb-3(b)(1), unless the authorization is terminated  or revoked sooner.       Influenza A by PCR NEGATIVE NEGATIVE Final   Influenza B by PCR NEGATIVE NEGATIVE Final    Comment: (NOTE) The Xpert Xpress SARS-CoV-2/FLU/RSV plus assay is intended as an aid in the diagnosis of influenza from Nasopharyngeal swab specimens and should not be used as a sole basis for treatment. Nasal washings and aspirates are unacceptable for Xpert Xpress SARS-CoV-2/FLU/RSV testing.  Fact Sheet for Patients: EntrepreneurPulse.com.au  Fact Sheet for Healthcare Providers: IncredibleEmployment.be  This test is not yet approved or cleared by the [place] FDA and has been authorized for detection and/or diagnosis of SARS-CoV-2 by FDA under an Emergency Use Authorization (EUA). This EUA will remain in effect (meaning this test can be used) for the duration of the COVID-19 declaration under Section 564(b)(1) of the Act, 21 U.S.C. section 360bbb-3(b)(1), unless the authorization is terminated or revoked.     Resp Syncytial Virus by PCR NEGATIVE NEGATIVE Final    Comment: (NOTE) Fact Sheet for Patients: EntrepreneurPulse.com.au  Fact Sheet for Healthcare  Providers: IncredibleEmployment.be  This test is not yet approved or cleared by the [place] FDA and has been authorized for detection and/or diagnosis of SARS-CoV-2 by FDA under an Emergency Use Authorization (EUA). This EUA will remain in effect (meaning this test can be used) for the duration of the COVID-19 declaration under Section 564(b)(1) of the Act,  21 U.S.C. section 360bbb-3(b)(1), unless the authorization is terminated or revoked.  Performed at [facility], [address] [place], [place] [address]      Radiological Exams on Admission: CT Chest Wo Contrast  Result Date: 03/20/2022 CLINICAL DATA:  Fever and chills. EXAM: CT CHEST WITHOUT CONTRAST TECHNIQUE: Multidetector CT imaging of the chest was performed following the standard protocol without IV contrast. RADIATION DOSE REDUCTION: This exam was performed according to the departmental dose-optimization program which includes automated exposure control, adjustment of the mA and/or kV according to patient size and/or use of iterative reconstruction technique. COMPARISON:  None Available. FINDINGS: Cardiovascular: The heart size is normal. No substantial pericardial effusion. Coronary artery calcification is evident. Mild atherosclerotic calcification is noted in the wall of the thoracic aorta. Mediastinum/Nodes: No mediastinal lymphadenopathy. No evidence for gross hilar lymphadenopathy although assessment is limited by the lack of intravenous contrast on the current study. The esophagus has normal imaging features. There is no axillary lymphadenopathy. Lungs/Pleura: No suspicious pulmonary nodule or mass. No focal airspace consolidation. No pleural effusion. Upper Abdomen: Stomach is distended with food and fluid. Musculoskeletal: No worrisome lytic or sclerotic osseous abnormality. IMPRESSION: 1. No acute findings in the chest. Specifically, no findings to explain the patient's history of fever and  chills. 2. Coronary artery calcification. 3.  Aortic Atherosclerosis (ICD10-I70.0). Electronically Signed   By: Misty Stanley M.D.   On: 03/20/2022 10:24   DG Chest 2 View  Result Date: 03/20/2022 CLINICAL DATA:  Sepsis. Low abdominal pain and diarrhea. Fever and chills. EXAM: CHEST - 2 VIEW COMPARISON:  01/24/2020 FINDINGS: Heart size and mediastinal contours are unremarkable. Lung volumes are low. No pleural fluid or airspace disease. Degenerative changes noted at the acromioclavicular joints. IMPRESSION: Low lung volumes. Electronically Signed   By: Kerby Moors M.D.   On: 03/20/2022 08:36   CT ABDOMEN PELVIS W CONTRAST  Result Date: 03/20/2022 CLINICAL DATA:  Abdominal pain with diarrhea. EXAM: CT ABDOMEN AND PELVIS WITH CONTRAST TECHNIQUE: Multidetector CT imaging of the abdomen and pelvis was performed using the standard protocol following bolus administration of intravenous contrast. RADIATION DOSE REDUCTION: This exam was performed according to the departmental dose-optimization program which includes automated exposure control, adjustment of the mA and/or kV according to patient size and/or use of iterative reconstruction technique. CONTRAST:  120m OMNIPAQUE IOHEXOL 300 MG/ML  SOLN COMPARISON:  04/07/2020 FINDINGS: Lower chest: Unremarkable. Hepatobiliary: No suspicious focal abnormality within the liver parenchyma. Gallbladder is surgically absent. No intrahepatic or extrahepatic biliary dilation. Pancreas: No focal mass lesion. No dilatation of the main duct. No intraparenchymal cyst. No peripancreatic edema. Similar appearance of parenchymal atrophy in the tail the pancreas without appreciable main duct dilatation. This appearance is also stable comparing back to CT from 05/10/2014. Spleen: No splenomegaly. No focal mass lesion. Adrenals/Urinary Tract: No adrenal nodule or mass. Tiny hypoattenuating lesions are again noted in both kidneys, too small to characterize but likely benign. No  followup imaging is recommended. No evidence for hydroureter. The urinary bladder appears normal for the degree of distention. Stomach/Bowel: The stomach is distended with food and fluid. Duodenum is normally positioned as is the ligament of Treitz. No small bowel wall thickening. No small bowel dilatation. The terminal ileum is normal. The appendix is normal. No gross colonic mass. No colonic wall thickening. Vascular/Lymphatic: There is moderate atherosclerotic calcification of the abdominal aorta without aneurysm. There is no gastrohepatic or hepatoduodenal ligament lymphadenopathy. No retroperitoneal or mesenteric lymphadenopathy. No pelvic sidewall lymphadenopathy. Reproductive:  Prostate gland mildly enlarged. Other: No intraperitoneal free fluid. Musculoskeletal: Status post right hip replacement. Lumbar fusion hardware again noted. IMPRESSION: 1. No acute findings in the abdomen or pelvis. Specifically, no findings to explain the patient's history of abdominal pain and diarrhea. 2. Stable appearance of parenchymal atrophy in the tail the pancreas comparing back to CT from 05/10/2014. No appreciable main duct dilatation. No features to suggest acute pancreatitis on the current exam. 3. Prostatomegaly. 4.  Aortic Atherosclerosis (ICD10-I70.0). Electronically Signed   By: Misty Stanley M.D.   On: 03/20/2022 07:56      Assessment/Plan Principal Problem:   Abdominal pain Active Problems:   Nausea vomiting and diarrhea   SIRS (systemic inflammatory response syndrome) (HCC)   HTN (hypertension)   Diabetes mellitus without complication (HCC)   HLD (hyperlipidemia)   OSA on CPAP   Obesity   Assessment and Plan:   Abdominal pain and nausea vomiting and diarrhea and SIRS: Etiology is not clear, clinically patient sounds to have viral gastroenteritis.  CT of abdomen/pelvis is negative for acute intra-abdominal issues.  CT of chest is negative.  Patient meets criteria for SIRS with heart rate up to  121, RR 28, lactic acid 3.4.  Patient also has fever 100.1, mild leukocytosis with WBC 12.0.  No clear source of infection identified yet.  Will treat patient as SIRS now.  If source of infection is identified, will change to sepsis.  Patient received 1 dose of vancomycin, cefepime and Flagyl in ED, will not continue antibiotics since I suspect patient may have viral infection.  -Admit to telemetry bed as inpatient -Follow-up of blood culture -Check RVP, C. difficile and GI pathogen panel -IV fluid: 3.5 L normal saline, then 75 cc/h -Lactic acid level and check procalcitonin level  HTN (hypertension) -Hold all blood pressure medications since patient has soft blood pressure  Diabetes mellitus without complication ([place]): Recent A1c 6.8, well-controlled.  Patient taking metformin -SSI  HLD (hyperlipidemia) -Pravastatin  OSA -CPAP  Obesity: Body weight 130.4 kg, BMI 38.99 -Healthy diet and exercise -Encourage losing weight    DVT ppx:  SQ Lovenox  Code Status: Full code  Family Communication: I offered to call his family, but patient states that his ex-wife knows was going on for him, he does not want me to call his family.  Disposition Plan:  Anticipate discharge back to previous environment  Consults called: None  Admission status and Level of care: Telemetry Medical:   as inpt       Dispo: The patient is from: Home              Anticipated d/c is to: Home              Anticipated d/c date is: 2 days              Patient currently is not medically stable to d/c.    Severity of Illness:  The appropriate patient status for this patient is INPATIENT. Inpatient status is judged to be reasonable and necessary in order to provide the required intensity of service to ensure the patient's safety. The patient's presenting symptoms, physical exam findings, and initial radiographic and laboratory data in the context of their chronic comorbidities is felt to place them at high  risk for further clinical deterioration. Furthermore, it is not anticipated that the patient will be medically stable for discharge from the hospital within 2 midnights of admission.   * I certify that at the point of admission  it is my clinical judgment that the patient will require inpatient hospital care spanning beyond 2 midnights from the point of admission due to high intensity of service, high risk for further deterioration and high frequency of surveillance required.*       Date of Service 03/20/2022    Ivor Costa [facility]   If 7PM-7AM, please contact night-coverage [email or web address] 03/20/2022, 12:52 PM

## 2022-03-20 NOTE — ED Notes (Signed)
This RN and pt. Ambulate in hall while attached to cardiac monitoring. Pt's HR increased to 130's and maintained, initial oxygen saturation when getting out of the bed was 86%, pt. Took several deep breaths and restored O2 to mid-90's%. While ambulating pt has moderately labored breathing, O2 sat remains 96-100%. After sitting down in chair in room (per pt. Request)post ambulation, pt. BP decreases, and when asked how he feels pt. States he feels a little dizzy. This RN strongly urges pt. Back to bed and notifies Dr. Myrene Buddy. See orders

## 2022-03-20 NOTE — ED Notes (Incomplete)
Pt. Ambulated in hall while on cardiac and oxygen monitoring. Pt.

## 2022-03-20 NOTE — ED Notes (Signed)
Pt. Up to toilet in room for BM, gait slow and steady, states hip hurting.

## 2022-03-20 NOTE — ED Notes (Signed)
Pt. Off unit, will obtain blood when he gets back.

## 2022-03-20 NOTE — Progress Notes (Signed)
Elink following code sepsis °

## 2022-03-20 NOTE — ED Provider Notes (Signed)
[facility] Provider Note    Event Date/Time   First MD Initiated Contact with Patient 03/20/22 [phone]     (approximate)   History   Abdominal Pain   HPI  Mike Patel is a 62 y.o. male here with abdominal pain.  The patient is a 62 year old male with no major past medical history here with nausea, chills, fatigue, and abdominal pain with 1 episode of emesis and loose stools.  The patient states he was at his mother's funeral over the weekend.  He was around many people.  He states that over the last 24 to 48 hours, he has had general chills, fatigue, and weakness.  He said decreased appetite.  He has had difficulty keeping stuff down.  He said some mild shortness of breath.  No cough.  No chest pain.  He states that he feels generally fatigued.  Did not take anything this morning, including his blood pressure medications.  He felt generally weak so he presents for evaluation.     Physical Exam   Triage Vital Signs: ED Triage Vitals [03/20/22 0558]  Enc Vitals Group     BP 130/71     Pulse Rate (!) 106     Resp 18     Temp 98.5 F (36.9 C)     Temp Source Oral     SpO2 100 %     Weight 285 lb (129.3 kg)     Height 6' (1.829 m)     Head Circumference      Peak Flow      Pain Score 7     Pain Loc      Pain Edu?      Excl. in [place]?     Most recent vital signs: Vitals:   03/20/22 1130 03/20/22 1203  BP: 136/66 112/74  Pulse: (!) 111 (!) 105  Resp: (!) 24 18  Temp:  98.4 F (36.9 C)  SpO2: 96% 91%     General: Awake, no distress.  CV:  Good peripheral perfusion.  Tachycardia. Resp:  Normal work of breathing.  Mild bibasilar rales noted. Abd:  No distention.  No overt tenderness.  No rebound or guarding. Other:  Dry mucous membranes.   ED Results / Procedures / Treatments   Labs (all labs ordered are listed, but only abnormal results are displayed) Labs Reviewed  URINALYSIS, ROUTINE W REFLEX MICROSCOPIC - Abnormal; Notable for the  following components:      Result Value   Color, Urine YELLOW (*)    APPearance CLEAR (*)    Glucose, UA >=500 (*)    Ketones, ur 20 (*)    All other components within normal limits  CBC WITH DIFFERENTIAL/PLATELET - Abnormal; Notable for the following components:   WBC 12.0 (*)    Neutro Abs 10.7 (*)    Lymphs Abs 0.5 (*)    All other components within normal limits  COMPREHENSIVE METABOLIC PANEL - Abnormal; Notable for the following components:   Glucose, Bld 277 (*)    BUN 25 (*)    Calcium 8.8 (*)    Total Bilirubin 1.5 (*)    All other components within normal limits  LACTIC ACID, PLASMA - Abnormal; Notable for the following components:   Lactic Acid, Venous 3.4 (*)    All other components within normal limits  LACTIC ACID, PLASMA - Abnormal; Notable for the following components:   Lactic Acid, Venous 2.4 (*)    All other components within normal  limits  GLUCOSE, CAPILLARY - Abnormal; Notable for the following components:   Glucose-Capillary 166 (*)    All other components within normal limits  RESP PANEL BY RT-PCR (RSV, FLU A&B, COVID)  RVPGX2  C DIFFICILE QUICK SCREEN W PCR REFLEX    CULTURE, BLOOD (SINGLE)  CULTURE, BLOOD (SINGLE)  RESPIRATORY PANEL BY PCR  GASTROINTESTINAL PANEL BY PCR, STOOL (REPLACES STOOL CULTURE)  LIPASE, BLOOD  PROTIME-INR  PROCALCITONIN  HIV ANTIBODY (ROUTINE TESTING W REFLEX)     EKG Sinus tachycardia, ventricular rate 108.  PR 159, QRS 76, QTc 420.  No acute ST elevations or depressions.  No acute evidence of acute ischemia or infarct.   RADIOLOGY CT abdomen/pelvis: No acute findings, prostatomegaly Chest x-ray: Low lung volumes   I also independently reviewed and agree with radiologist interpretations.   PROCEDURES:  Critical Care performed: Yes, see critical care procedure note(s)  .Critical Care  Performed by: Duffy Bruce, MD Authorized by: Duffy Bruce, MD   Critical care provider statement:    Critical care time  (minutes):  30   Critical care time was exclusive of:  Separately billable procedures and treating other patients   Critical care was necessary to treat or prevent imminent or life-threatening deterioration of the following conditions:  Cardiac failure, circulatory failure, respiratory failure and sepsis   Critical care was time spent personally by me on the following activities:  Development of treatment plan with patient or surrogate, discussions with consultants, evaluation of patient's response to treatment, examination of patient, ordering and review of laboratory studies, ordering and review of radiographic studies, ordering and performing treatments and interventions, pulse oximetry, re-evaluation of patient's condition and review of old [place] ED: Medications  lactated ringers infusion ( Intravenous Infusion Verify 03/20/22 1518)  ondansetron (ZOFRAN) injection 4 mg (has no administration in time range)  dextromethorphan-guaiFENesin (MUCINEX DM) 30-600 MG per 12 hr tablet 1 tablet (has no administration in time range)  acetaminophen (TYLENOL) tablet 650 mg (has no administration in time range)  fentaNYL (SUBLIMAZE) injection 25 mcg (25 mcg Intravenous Given 03/20/22 1222)  insulin aspart (novoLOG) injection 0-9 Units (2 Units Subcutaneous Given 03/20/22 1213)  insulin aspart (novoLOG) injection 0-5 Units (has no administration in time range)  albuterol (PROVENTIL) (2.5 MG/3ML) 0.083% nebulizer solution 2.5 mg (has no administration in time range)  enoxaparin (LOVENOX) injection 65 mg (has no administration in time range)  aspirin EC tablet 81 mg (81 mg Oral Given 03/20/22 1335)  HYDROcodone-acetaminophen (NORCO/VICODIN) 5-325 MG per tablet 1 tablet (1 tablet Oral Given 03/20/22 1339)  pravastatin (PRAVACHOL) tablet 10 mg (has no administration in time range)  traZODone (DESYREL) tablet 50 mg (has no administration in time range)  pantoprazole (PROTONIX) EC tablet  20 mg (20 mg Oral Given 03/20/22 1335)  melatonin tablet 10 mg (has no administration in time range)  gabapentin (NEURONTIN) capsule 300 mg (has no administration in time range)  cholecalciferol (VITAMIN D3) 25 MCG (1000 UNIT) tablet 5,000 Units (5,000 Units Oral Given 03/20/22 1335)  omega-3 acid ethyl esters (LOVAZA) capsule 1 g (1 g Oral Given 03/20/22 1335)  zinc sulfate capsule 220 mg (220 mg Oral Given 03/20/22 1335)  Oral care mouth rinse (has no administration in time range)  sodium chloride 0.9 % bolus 1,000 mL (0 mLs Intravenous Stopped 03/20/22 0908)  sodium chloride 0.9 % bolus 1,000 mL (0 mLs Intravenous Stopped 03/20/22 0908)  ondansetron (ZOFRAN) injection 4 mg (4 mg Intravenous Given 03/20/22 0735)  acetaminophen (TYLENOL) tablet 1,000 mg (1,000 mg Oral Given 03/20/22 0733)  ketorolac (TORADOL) 30 MG/ML injection 15 mg (15 mg Intravenous Given 03/20/22 0735)  iohexol (OMNIPAQUE) 300 MG/ML solution 100 mL (100 mLs Intravenous Contrast Given 03/20/22 0741)  ceFEPIme (MAXIPIME) 2 g in sodium chloride 0.9 % 100 mL IVPB (0 g Intravenous Stopped 03/20/22 1021)  metroNIDAZOLE (FLAGYL) IVPB 500 mg (0 mg Intravenous Stopped 03/20/22 1335)  vancomycin (VANCOCIN) IVPB 1000 mg/200 mL premix (0 mg Intravenous Stopped 03/20/22 1054)  lactated ringers bolus 1,000 mL (0 mLs Intravenous Stopped 03/20/22 1215)  sodium chloride 0.9 % bolus 1,000 mL (0 mLs Intravenous Stopped 03/20/22 1335)     IMPRESSION / MDM / ASSESSMENT AND PLAN / ED COURSE  I reviewed the triage vital signs and the nursing notes.                              Differential diagnosis includes, but is not limited to, sepsis from PNA, UTI, intra-abd pathology such as diverticulitis, anemia, viral syndrome.  Patient's presentation is most consistent with acute presentation with potential threat to life or bodily function.   The patient is on the cardiac monitor to evaluate for evidence of arrhythmia and/or significant heart rate  changes   62 year old male here with chills, fatigue, nausea, and bodyaches.  Clinically, suspect sepsis, possibly viral illness although he is moderately hypoxic here and clinically I suspect possible early pneumonia.  Chest x-ray is clear though does show low lung volumes.  CT abdomen and pelvis obtained and is negative.  Lab work shows leukocytosis, mild dehydration with ketonuria.  Blood sugar elevated but anion gap is normal.  Patient given 2 L of IV fluids and antipyretics and feels better, but remains tachycardic, borderline hypotensive, and is hypoxic with exertion.  Will admit for treatment for sepsis.  He has no headache, neck stiffness, or signs to suggest meningitis or encephalitis.  Clinically, suspect possible early pneumonia.  Will admit to medicine for sepsis, unclear source though likely respiratory based on clinical history. Pt in agreement. Broad spectrum ABX given.  FINAL CLINICAL IMPRESSION(S) / ED DIAGNOSES   Final diagnoses:  Sepsis without acute organ dysfunction, due to unspecified organism ([place])  Acute hypoxemic respiratory failure ([place])     Rx / DC Orders   ED Discharge Orders     None        Note:  This document was prepared using Dragon voice recognition software and may include unintentional dictation errors.   Duffy Bruce, MD 03/20/22 1538

## 2022-03-20 NOTE — Progress Notes (Signed)
PHARMACY -  BRIEF ANTIBIOTIC NOTE   Pharmacy has received consults for cefepime and vancomycin from an ED provider.  The patient's profile has been reviewed for ht/wt/allergies/indication/available labs.    One time orders placed for cefepime 2 grams x 1 and vancomycin 1,000 mg x 1  Further antibiotics/pharmacy consults should be ordered by admitting physician if indicated.                       Thank you,  Glean Salvo, PharmD, BCPS Clinical Pharmacist  03/20/2022 9:58 AM

## 2022-03-20 NOTE — Progress Notes (Signed)
CODE SEPSIS - PHARMACY COMMUNICATION  **Broad Spectrum Antibiotics should be administered within 1 hour of Sepsis diagnosis**  Time Code Sepsis Called/Page Received: 0950  Antibiotics Ordered: vancomycin 1,000 mg x 1 and cefepime 2 grams x 1  Time of 1st antibiotic administration: 0951  Additional action taken by pharmacy: none  If necessary, Name of Provider/Nurse Contacted: n/a    Glean Salvo, PharmD, BCPS Clinical Pharmacist  03/20/2022 9:59 AM

## 2022-03-20 NOTE — ED Notes (Signed)
Patient seen for admission assessment questions

## 2022-03-20 NOTE — ED Notes (Signed)
Pt brought to ed rm 17 at this time, this RN now assuming care.

## 2022-03-21 DIAGNOSIS — I10 Essential (primary) hypertension: Secondary | ICD-10-CM

## 2022-03-21 DIAGNOSIS — A0811 Acute gastroenteropathy due to Norwalk agent: Secondary | ICD-10-CM | POA: Diagnosis present

## 2022-03-21 DIAGNOSIS — R6510 Systemic inflammatory response syndrome (SIRS) of non-infectious origin without acute organ dysfunction: Secondary | ICD-10-CM | POA: Diagnosis not present

## 2022-03-21 DIAGNOSIS — R112 Nausea with vomiting, unspecified: Secondary | ICD-10-CM | POA: Diagnosis not present

## 2022-03-21 LAB — CBC
HCT: 40.4 % (ref 39.0–52.0)
Hemoglobin: 13.5 g/dL (ref 13.0–17.0)
MCH: 31.4 pg (ref 26.0–34.0)
MCHC: 33.4 g/dL (ref 30.0–36.0)
MCV: 94 fL (ref 80.0–100.0)
Platelets: 154 10*3/uL (ref 150–400)
RBC: 4.3 MIL/uL (ref 4.22–5.81)
RDW: 13 % (ref 11.5–15.5)
WBC: 5.6 10*3/uL (ref 4.0–10.5)
nRBC: 0 % (ref 0.0–0.2)

## 2022-03-21 LAB — BASIC METABOLIC PANEL
Anion gap: 5 (ref 5–15)
BUN: 16 mg/dL (ref 8–23)
CO2: 26 mmol/L (ref 22–32)
Calcium: 7.5 mg/dL — ABNORMAL LOW (ref 8.9–10.3)
Chloride: 106 mmol/L (ref 98–111)
Creatinine, Ser: 0.83 mg/dL (ref 0.61–1.24)
GFR, Estimated: >60 mL/min (ref >60)
Glucose, Bld: 148 mg/dL — ABNORMAL HIGH (ref 70–99)
Potassium: 3.4 mmol/L — ABNORMAL LOW (ref 3.5–5.1)
Sodium: 137 mmol/L (ref 135–145)

## 2022-03-21 LAB — GLUCOSE, CAPILLARY: Glucose-Capillary: 166 mg/dL — ABNORMAL HIGH (ref 70–99)

## 2022-03-21 MED ORDER — LACTATED RINGERS IV SOLN: INTRAVENOUS | Status: DC

## 2022-03-21 MED ORDER — LISINOPRIL 40 MG PO TABS: 40.0000 mg | ORAL_TABLET | Freq: Every day | ORAL | 0 refills | Status: AC

## 2022-03-21 NOTE — Hospital Course (Signed)
62 y.o. male with medical history significant of hypertension, hyperlipidemia, diabetes mellitus, gout, BPH, OSA on CPAP, chronic pancreatitis, who presents with fever, chills, nausea, vomiting, diarrhea and abdominal pain.   Patient states that he was at his mother's funeral over the weekend.  He was around many people.  In the past 2 days, he developed fever, chills, nausea, vomiting, diarrhea and central abdominal pain.  His abdominal pain was mild, which has resolved.  He has had 4-5 times of watery diarrhea, which has improved today.  Patient has nausea and vomited once or twice, with nonbilious nonbloody vomiting.  Patient reported to ED physician that he had some mild shortness breath, but he denies cough, shortness breath, chest pain to me.  He has generalized weakness.    2/28.  Patient feeling better.  No abdominal pain.  Diarrhea have subsided.  Norovirus positive on GI panel.

## 2022-03-21 NOTE — Assessment & Plan Note (Signed)
Improved.  Along with abdominal pain also improved.

## 2022-03-21 NOTE — Assessment & Plan Note (Signed)
Secondary to norovirus.

## 2022-03-21 NOTE — Assessment & Plan Note (Signed)
Supportive care.  Can take Imodium if needed.  Continue to stay hydrated.

## 2022-03-21 NOTE — Progress Notes (Signed)
Discharge instructions were reviewed with patient. Questions were encourage and answered. IV were taken out. Belongings were collected by patient. Patient denies pain at this time.

## 2022-03-21 NOTE — Discharge Summary (Signed)
Physician Discharge Summary   Patient: Mike Patel MRN: [id number] DOB: May 04, 1960  Admit date:     03/20/2022  Discharge date: 03/21/22  Discharge Physician: Loletha Grayer   PCP: Maryland Pink, MD   Recommendations at discharge:   Follow-up PCP 5 days  Discharge Diagnoses: Principal Problem:   Gastroenteritis due to norovirus Active Problems:   Nausea vomiting and diarrhea   SIRS (systemic inflammatory response syndrome) (HCC)   HTN (hypertension)   Diabetes mellitus without complication (HCC)   HLD (hyperlipidemia)   OSA on CPAP   Obesity    Hospital Course: 62 y.o. male with medical history significant of hypertension, hyperlipidemia, diabetes mellitus, gout, BPH, OSA on CPAP, chronic pancreatitis, who presents with fever, chills, nausea, vomiting, diarrhea and abdominal pain.   Patient states that he was at his mother's funeral over the weekend.  He was around many people.  In the past 2 days, he developed fever, chills, nausea, vomiting, diarrhea and central abdominal pain.  His abdominal pain was mild, which has resolved.  He has had 4-5 times of watery diarrhea, which has improved today.  Patient has nausea and vomited once or twice, with nonbilious nonbloody vomiting.  Patient reported to ED physician that he had some mild shortness breath, but he denies cough, shortness breath, chest pain to me.  He has generalized weakness.    2/28.  Patient feeling better.  No abdominal pain.  Diarrhea have subsided.  Norovirus positive on GI panel.  Assessment and Plan: * Gastroenteritis due to norovirus Supportive care.  Can take Imodium if needed.  Continue to stay hydrated.  Nausea vomiting and diarrhea Improved.  Along with abdominal pain also improved.  SIRS (systemic inflammatory response syndrome) (HCC) Secondary to norovirus.  HTN (hypertension) Can go back on Toprol and lisinopril.  If blood pressure remains elevated can go back on Norvasc.  Diabetes mellitus  without complication ([place]) Hold Glucophage until diarrhea illness has subsided.  HLD (hyperlipidemia) Continue pravastatin  OSA on CPAP Continue CPAP  Obesity BMI 38.99.         Consultants: None Procedures performed: None Disposition: Home Diet recommendation:  Cardiac and Carb modified diet DISCHARGE MEDICATION: Allergies as of 03/21/2022       Reactions   Percocet [oxycodone-acetaminophen] Itching   Tape Rash   Dermabond skin glue and Tape        Medication List     STOP taking these medications    amLODipine 10 MG tablet Commonly known as: NORVASC   hydrochlorothiazide 12.5 MG tablet Commonly known as: HYDRODIURIL   HYDROcodone-acetaminophen 5-325 MG tablet Commonly known as: NORCO/VICODIN   meloxicam 15 MG tablet Commonly known as: MOBIC   metFORMIN 500 MG 24 hr tablet Commonly known as: GLUCOPHAGE-XR   sildenafil 20 MG tablet Commonly known as: REVATIO       TAKE these medications    aspirin EC 81 MG tablet Take by mouth.   cyanocobalamin 1000 MCG/ML injection Commonly known as: VITAMIN B12 Inject 1,000 mcg into the muscle every 30 (thirty) days.   Fish Oil 1000 MG Caps Take 2,000 mg by mouth daily.   fluticasone 50 MCG/ACT nasal spray Commonly known as: FLONASE Place 2 sprays into both nostrils in the morning and at bedtime.   gabapentin 300 MG capsule Commonly known as: NEURONTIN Take 300 mg by mouth 3 (three) times daily as needed.   lansoprazole 30 MG capsule Commonly known as: PREVACID Take 30 mg by mouth daily.   lisinopril 40 MG  tablet Commonly known as: ZESTRIL Take 1 tablet (40 mg total) by mouth daily. What changed: how much to take   Melatonin 10 MG Tabs Take 10 mg by mouth at bedtime as needed (sleep).   metoprolol succinate 50 MG 24 hr tablet Commonly known as: TOPROL-XL Take 100 mg by mouth daily. Take with or immediately following a meal.   OneTouch Verio test strip Generic drug: glucose blood 1 each  daily.   pravastatin 10 MG tablet Commonly known as: PRAVACHOL Take 10 mg by mouth at bedtime.   testosterone cypionate 200 MG/ML injection Commonly known as: DEPOTESTOSTERONE CYPIONATE Inject 0.5 mLs (100 mg total) into the muscle every 7 (seven) days.   traZODone 50 MG tablet Commonly known as: DESYREL Take 50 mg by mouth at bedtime.   Vitamin D-3 125 MCG (5000 UT) Tabs Take 5,000 Units by mouth 2 (two) times daily.   zinc gluconate 50 MG tablet Take 50 mg by mouth daily.        Follow-up Information     Maryland Pink, MD. Schedule an appointment as soon as possible for a visit in 5 day(s).   Specialty: Family Medicine Why: per office patient scheduling coordinator will call with appointment. Contact information: [address] [place] [address] [phone]                Discharge Exam: Filed Weights   03/20/22 0558 03/20/22 0645 03/20/22 1203  Weight: 129.3 kg 130.4 kg 130.4 kg   Physical Exam HENT:     Head: Normocephalic.     Mouth/Throat:     Pharynx: No oropharyngeal exudate.  Eyes:     General: Lids are normal.     Conjunctiva/sclera: Conjunctivae normal.  Cardiovascular:     Rate and Rhythm: Normal rate and regular rhythm.     Heart sounds: Normal heart sounds, S1 normal and S2 normal.  Pulmonary:     Breath sounds: No decreased breath sounds, wheezing, rhonchi or rales.  Abdominal:     Palpations: Abdomen is soft.     Tenderness: There is no abdominal tenderness.  Musculoskeletal:     Right lower leg: No swelling.     Left lower leg: No swelling.  Skin:    General: Skin is warm.     Findings: No rash.  Neurological:     Mental Status: He is alert and oriented to person, place, and time.      Condition at discharge: stable  The results of significant diagnostics from this hospitalization (including imaging, microbiology, ancillary and laboratory) are listed below for reference.   Imaging Studies: CT Chest Wo  Contrast  Result Date: 03/20/2022 CLINICAL DATA:  Fever and chills. EXAM: CT CHEST WITHOUT CONTRAST TECHNIQUE: Multidetector CT imaging of the chest was performed following the standard protocol without IV contrast. RADIATION DOSE REDUCTION: This exam was performed according to the departmental dose-optimization program which includes automated exposure control, adjustment of the mA and/or kV according to patient size and/or use of iterative reconstruction technique. COMPARISON:  None Available. FINDINGS: Cardiovascular: The heart size is normal. No substantial pericardial effusion. Coronary artery calcification is evident. Mild atherosclerotic calcification is noted in the wall of the thoracic aorta. Mediastinum/Nodes: No mediastinal lymphadenopathy. No evidence for gross hilar lymphadenopathy although assessment is limited by the lack of intravenous contrast on the current study. The esophagus has normal imaging features. There is no axillary lymphadenopathy. Lungs/Pleura: No suspicious pulmonary nodule or mass. No focal airspace consolidation. No pleural effusion. Upper Abdomen:  Stomach is distended with food and fluid. Musculoskeletal: No worrisome lytic or sclerotic osseous abnormality. IMPRESSION: 1. No acute findings in the chest. Specifically, no findings to explain the patient's history of fever and chills. 2. Coronary artery calcification. 3.  Aortic Atherosclerosis (ICD10-I70.0). Electronically Signed   By: Misty Stanley M.D.   On: 03/20/2022 10:24   DG Chest 2 View  Result Date: 03/20/2022 CLINICAL DATA:  Sepsis. Low abdominal pain and diarrhea. Fever and chills. EXAM: CHEST - 2 VIEW COMPARISON:  01/24/2020 FINDINGS: Heart size and mediastinal contours are unremarkable. Lung volumes are low. No pleural fluid or airspace disease. Degenerative changes noted at the acromioclavicular joints. IMPRESSION: Low lung volumes. Electronically Signed   By: Kerby Moors M.D.   On: 03/20/2022 08:36   CT  ABDOMEN PELVIS W CONTRAST  Result Date: 03/20/2022 CLINICAL DATA:  Abdominal pain with diarrhea. EXAM: CT ABDOMEN AND PELVIS WITH CONTRAST TECHNIQUE: Multidetector CT imaging of the abdomen and pelvis was performed using the standard protocol following bolus administration of intravenous contrast. RADIATION DOSE REDUCTION: This exam was performed according to the departmental dose-optimization program which includes automated exposure control, adjustment of the mA and/or kV according to patient size and/or use of iterative reconstruction technique. CONTRAST:  158m OMNIPAQUE IOHEXOL 300 MG/ML  SOLN COMPARISON:  04/07/2020 FINDINGS: Lower chest: Unremarkable. Hepatobiliary: No suspicious focal abnormality within the liver parenchyma. Gallbladder is surgically absent. No intrahepatic or extrahepatic biliary dilation. Pancreas: No focal mass lesion. No dilatation of the main duct. No intraparenchymal cyst. No peripancreatic edema. Similar appearance of parenchymal atrophy in the tail the pancreas without appreciable main duct dilatation. This appearance is also stable comparing back to CT from 05/10/2014. Spleen: No splenomegaly. No focal mass lesion. Adrenals/Urinary Tract: No adrenal nodule or mass. Tiny hypoattenuating lesions are again noted in both kidneys, too small to characterize but likely benign. No followup imaging is recommended. No evidence for hydroureter. The urinary bladder appears normal for the degree of distention. Stomach/Bowel: The stomach is distended with food and fluid. Duodenum is normally positioned as is the ligament of Treitz. No small bowel wall thickening. No small bowel dilatation. The terminal ileum is normal. The appendix is normal. No gross colonic mass. No colonic wall thickening. Vascular/Lymphatic: There is moderate atherosclerotic calcification of the abdominal aorta without aneurysm. There is no gastrohepatic or hepatoduodenal ligament lymphadenopathy. No retroperitoneal or  mesenteric lymphadenopathy. No pelvic sidewall lymphadenopathy. Reproductive: Prostate gland mildly enlarged. Other: No intraperitoneal free fluid. Musculoskeletal: Status post right hip replacement. Lumbar fusion hardware again noted. IMPRESSION: 1. No acute findings in the abdomen or pelvis. Specifically, no findings to explain the patient's history of abdominal pain and diarrhea. 2. Stable appearance of parenchymal atrophy in the tail the pancreas comparing back to CT from 05/10/2014. No appreciable main duct dilatation. No features to suggest acute pancreatitis on the current exam. 3. Prostatomegaly. 4.  Aortic Atherosclerosis (ICD10-I70.0). Electronically Signed   By: EMisty StanleyM.D.   On: 03/20/2022 07:56    Microbiology: Results for orders placed or performed during the hospital encounter of 03/20/22  Resp panel by RT-PCR (RSV, Flu A&B, Covid) Anterior Nasal Swab     Status: None   Collection Time: 03/20/22  5:58 AM   Specimen: Anterior Nasal Swab  Result Value Ref Range Status   SARS Coronavirus 2 by RT PCR NEGATIVE NEGATIVE Final    Comment: (NOTE) SARS-CoV-2 target nucleic acids are NOT DETECTED.  The SARS-CoV-2 RNA is generally detectable in upper respiratory specimens during  the acute phase of infection. The lowest concentration of SARS-CoV-2 viral copies this assay can detect is 138 copies/mL. A negative result does not preclude SARS-Cov-2 infection and should not be used as the sole basis for treatment or other patient management decisions. A negative result may occur with  improper specimen collection/handling, submission of specimen other than nasopharyngeal swab, presence of viral mutation(s) within the areas targeted by this assay, and inadequate number of viral copies(<138 copies/mL). A negative result must be combined with clinical observations, patient history, and epidemiological information. The expected result is Negative.  Fact Sheet for Patients:   EntrepreneurPulse.com.au  Fact Sheet for Healthcare Providers:  IncredibleEmployment.be  This test is no t yet approved or cleared by the [place] FDA and  has been authorized for detection and/or diagnosis of SARS-CoV-2 by FDA under an Emergency Use Authorization (EUA). This EUA will remain  in effect (meaning this test can be used) for the duration of the COVID-19 declaration under Section 564(b)(1) of the Act, 21 U.S.C.section 360bbb-3(b)(1), unless the authorization is terminated  or revoked sooner.       Influenza A by PCR NEGATIVE NEGATIVE Final   Influenza B by PCR NEGATIVE NEGATIVE Final    Comment: (NOTE) The Xpert Xpress SARS-CoV-2/FLU/RSV plus assay is intended as an aid in the diagnosis of influenza from Nasopharyngeal swab specimens and should not be used as a sole basis for treatment. Nasal washings and aspirates are unacceptable for Xpert Xpress SARS-CoV-2/FLU/RSV testing.  Fact Sheet for Patients: EntrepreneurPulse.com.au  Fact Sheet for Healthcare Providers: IncredibleEmployment.be  This test is not yet approved or cleared by the [place] FDA and has been authorized for detection and/or diagnosis of SARS-CoV-2 by FDA under an Emergency Use Authorization (EUA). This EUA will remain in effect (meaning this test can be used) for the duration of the COVID-19 declaration under Section 564(b)(1) of the Act, 21 U.S.C. section 360bbb-3(b)(1), unless the authorization is terminated or revoked.     Resp Syncytial Virus by PCR NEGATIVE NEGATIVE Final    Comment: (NOTE) Fact Sheet for Patients: EntrepreneurPulse.com.au  Fact Sheet for Healthcare Providers: IncredibleEmployment.be  This test is not yet approved or cleared by the [place] FDA and has been authorized for detection and/or diagnosis of SARS-CoV-2 by FDA under an Emergency Use  Authorization (EUA). This EUA will remain in effect (meaning this test can be used) for the duration of the COVID-19 declaration under Section 564(b)(1) of the Act, 21 U.S.C. section 360bbb-3(b)(1), unless the authorization is terminated or revoked.  Performed at [facility], [address] [place], [place] [address]   Blood culture (single)     Status: None (Preliminary result)   Collection Time: 03/20/22  9:08 AM   Specimen: BLOOD  Result Value Ref Range Status   Specimen Description BLOOD BLOOD RIGHT FOREARM  Final   Special Requests   Final    BOTTLES DRAWN AEROBIC AND ANAEROBIC Blood Culture results may not be optimal due to an excessive volume of blood received in culture bottles   Culture   Final    NO GROWTH 1 DAY Performed at [facility], [address], [place], [place] [address]    Report Status PENDING  Incomplete  Culture, blood (single)     Status: None (Preliminary result)   Collection Time: 03/20/22 12:12 PM   Specimen: BLOOD  Result Value Ref Range Status   Specimen Description BLOOD RIGHT ANTECUBITAL  Final   Special Requests   Final  BOTTLES DRAWN AEROBIC AND ANAEROBIC Blood Culture adequate volume   Culture   Final    NO GROWTH < 24 HOURS Performed at [facility], [address] [place], [place] [address]    Report Status PENDING  Incomplete  Respiratory (~20 pathogens) panel by PCR     Status: None   Collection Time: 03/20/22 12:25 PM   Specimen: Nasopharyngeal Swab; Respiratory  Result Value Ref Range Status   Adenovirus NOT DETECTED NOT DETECTED Final   Coronavirus 229E NOT DETECTED NOT DETECTED Final    Comment: (NOTE) The Coronavirus on the Respiratory Panel, DOES NOT test for the novel  Coronavirus (2019 nCoV)    Coronavirus HKU1 NOT DETECTED NOT DETECTED Final   Coronavirus NL63 NOT DETECTED NOT DETECTED Final   Coronavirus OC43 NOT DETECTED NOT DETECTED Final   Metapneumovirus NOT DETECTED NOT DETECTED  Final   Rhinovirus / Enterovirus NOT DETECTED NOT DETECTED Final   Influenza A NOT DETECTED NOT DETECTED Final   Influenza B NOT DETECTED NOT DETECTED Final   Parainfluenza Virus 1 NOT DETECTED NOT DETECTED Final   Parainfluenza Virus 2 NOT DETECTED NOT DETECTED Final   Parainfluenza Virus 3 NOT DETECTED NOT DETECTED Final   Parainfluenza Virus 4 NOT DETECTED NOT DETECTED Final   Respiratory Syncytial Virus NOT DETECTED NOT DETECTED Final   Bordetella pertussis NOT DETECTED NOT DETECTED Final   Bordetella Parapertussis NOT DETECTED NOT DETECTED Final   Chlamydophila pneumoniae NOT DETECTED NOT DETECTED Final   Mycoplasma pneumoniae NOT DETECTED NOT DETECTED Final    Comment: Performed at [facility], [address] [place], [place] [address]  C Difficile Quick Screen w PCR reflex     Status: None   Collection Time: 03/20/22  1:43 PM   Specimen: STOOL  Result Value Ref Range Status   C Diff antigen NEGATIVE NEGATIVE Final   C Diff toxin NEGATIVE NEGATIVE Final   C Diff interpretation No C. difficile detected.  Final    Comment: Performed at [facility], [address] [place], [place] [address]  Gastrointestinal Panel by PCR , Stool     Status: Abnormal   Collection Time: 03/20/22  1:43 PM   Specimen: STOOL  Result Value Ref Range Status   Campylobacter species NOT DETECTED NOT DETECTED Final   Plesimonas shigelloides NOT DETECTED NOT DETECTED Final   Salmonella species NOT DETECTED NOT DETECTED Final   Yersinia enterocolitica NOT DETECTED NOT DETECTED Final   Vibrio species NOT DETECTED NOT DETECTED Final   Vibrio cholerae NOT DETECTED NOT DETECTED Final   Enteroaggregative E coli (EAEC) NOT DETECTED NOT DETECTED Final   Enteropathogenic E coli (EPEC) NOT DETECTED NOT DETECTED Final   Enterotoxigenic E coli (ETEC) NOT DETECTED NOT DETECTED Final   Shiga like toxin producing E coli (STEC) NOT DETECTED NOT DETECTED Final   Shigella/Enteroinvasive E coli (EIEC)  NOT DETECTED NOT DETECTED Final   Cryptosporidium NOT DETECTED NOT DETECTED Final   Cyclospora cayetanensis NOT DETECTED NOT DETECTED Final   Entamoeba histolytica NOT DETECTED NOT DETECTED Final   Giardia lamblia NOT DETECTED NOT DETECTED Final   Adenovirus F40/41 NOT DETECTED NOT DETECTED Final   Astrovirus NOT DETECTED NOT DETECTED Final   Norovirus GI/GII DETECTED (A) NOT DETECTED Final    Comment: RESULT CALLED TO, READ BACK BY AND VERIFIED WITH: PATRICIA MYERS AT 1638 ON 03/20/22 BY SS    Rotavirus A NOT DETECTED NOT DETECTED Final   Sapovirus (I, II, IV, and V)  NOT DETECTED NOT DETECTED Final    Comment: Performed at [facility], [address] [place], [place] [address]    Labs: CBC: Recent Labs  Lab 03/20/22 0559 03/21/22 0406  WBC 12.0* 5.6  NEUTROABS 10.7*  --   HGB 16.6 13.5  HCT 49.9 40.4  MCV 94.3 94.0  PLT 223 [id number]   Basic Metabolic Panel: Recent Labs  Lab 03/20/22 0559 03/21/22 0406  NA 135 137  K 4.0 3.4*  CL 100 106  CO2 23 26  GLUCOSE 277* 148*  BUN 25* 16  CREATININE 0.99 0.83  CALCIUM 8.8* 7.5*   Liver Function Tests: Recent Labs  Lab 03/20/22 0559  AST 26  ALT 25  ALKPHOS 50  BILITOT 1.5*  PROT 6.7  ALBUMIN 4.1   CBG: Recent Labs  Lab 03/20/22 1158 03/20/22 1618 03/20/22 2111 03/21/22 0744  GLUCAP 166* 183* 196* 166*    Discharge time spent: greater than 30 minutes.  Signed: Loletha Grayer, MD [facility] 03/21/2022

## 2022-03-21 NOTE — Assessment & Plan Note (Signed)
BMI 38.99.

## 2022-03-21 NOTE — Assessment & Plan Note (Signed)
Continue pravastatin 

## 2022-03-21 NOTE — TOC CM/SW Note (Signed)
Patient has orders to discharge home today. Chart reviewed. No TOC needs identified. CSW signing off.  Dayton Scrape, Ashley

## 2022-03-21 NOTE — Assessment & Plan Note (Signed)
Can go back on Toprol and lisinopril.  If blood pressure remains elevated can go back on Norvasc.

## 2022-03-21 NOTE — Discharge Instructions (Signed)
Stay hydrated.  Hold metformen (glucophage) for a few days.  Can restart if not having diarrhea.  Take you Blood pressure at home.  If starts going high (140/90) can restart amlodipine.

## 2022-03-21 NOTE — Assessment & Plan Note (Signed)
Hold Glucophage until diarrhea illness has subsided.

## 2022-03-21 NOTE — Assessment & Plan Note (Signed)
Continue CPAP.  

## 2022-03-21 NOTE — Plan of Care (Signed)

## 2022-03-25 LAB — CULTURE, BLOOD (SINGLE)
Culture: NO GROWTH
Culture: NO GROWTH
Special Requests: ADEQUATE

## 2022-04-09 ENCOUNTER — Ambulatory Visit: Payer: BC Managed Care – PPO | Admitting: Dermatology

## 2022-04-09 ENCOUNTER — Encounter: Payer: Self-pay | Admitting: Dermatology

## 2022-04-09 VITALS — BP 134/82 | HR 64

## 2022-04-09 DIAGNOSIS — D229 Melanocytic nevi, unspecified: Secondary | ICD-10-CM

## 2022-04-09 DIAGNOSIS — Z1283 Encounter for screening for malignant neoplasm of skin: Secondary | ICD-10-CM | POA: Diagnosis not present

## 2022-04-09 DIAGNOSIS — L821 Other seborrheic keratosis: Secondary | ICD-10-CM

## 2022-04-09 DIAGNOSIS — L570 Actinic keratosis: Secondary | ICD-10-CM

## 2022-04-09 DIAGNOSIS — D1801 Hemangioma of skin and subcutaneous tissue: Secondary | ICD-10-CM

## 2022-04-09 DIAGNOSIS — Z86018 Personal history of other benign neoplasm: Secondary | ICD-10-CM

## 2022-04-09 DIAGNOSIS — D225 Melanocytic nevi of trunk: Secondary | ICD-10-CM | POA: Diagnosis not present

## 2022-04-09 DIAGNOSIS — Z85820 Personal history of malignant melanoma of skin: Secondary | ICD-10-CM | POA: Diagnosis not present

## 2022-04-09 DIAGNOSIS — Z85828 Personal history of other malignant neoplasm of skin: Secondary | ICD-10-CM

## 2022-04-09 DIAGNOSIS — L578 Other skin changes due to chronic exposure to nonionizing radiation: Secondary | ICD-10-CM

## 2022-04-09 DIAGNOSIS — L739 Follicular disorder, unspecified: Secondary | ICD-10-CM

## 2022-04-09 DIAGNOSIS — L814 Other melanin hyperpigmentation: Secondary | ICD-10-CM

## 2022-04-09 NOTE — Progress Notes (Signed)
Follow-Up Visit   Subjective  Mike Patel is a 62 y.o. male who presents for the following: Upper body skin exam (Hx of Melanoma, BCC, Dysplastic, AKs).  The patient has spots, moles and lesions to be evaluated, some may be new or changing and the patient has concerns that these could be cancer.   The following portions of the chart were reviewed this encounter and updated as appropriate:       Review of Systems:  No other skin or systemic complaints except as noted in HPI or Assessment and Plan.  Objective  Well appearing patient in no apparent distress; mood and affect are within normal limits.  All skin waist up examined.  Spinal mid back Well healed scar with no evidence of recurrence  L spinal upper back; R paraspinal mid upper back; R spinal mid back  L spinal upper back 4.59mm brown macule, lighter central  R paraspinal mid upper back 3.56mm med brown speckled macule  R spinal mid back 5.65mm brown macule with darker edge  R neck 4.0 x 3.63mm waxy gray brown macule     scalp x 5, R temple x 3 (8) Pink scaly macules  R occipital scalp 51mm indistinct pink firm flat papule    Assessment & Plan   Lentigines - Scattered tan macules - Due to sun exposure - Benign-appearing, observe - Recommend daily broad spectrum sunscreen SPF 30+ to sun-exposed areas, reapply every 2 hours as needed. - Call for any changes - back  Seborrheic Keratoses - Stuck-on, waxy, tan-brown papules and/or plaques  - Benign-appearing - Discussed benign etiology and prognosis. - Observe - Call for any changes - back  Melanocytic Nevi - Tan-brown and/or pink-flesh-colored symmetric macules and papules - Benign appearing on exam today - Observation - Call clinic for new or changing moles - Recommend daily use of broad spectrum spf 30+ sunscreen to sun-exposed areas.  - back  Hemangiomas - Red papules - Discussed benign nature - Observe - Call for any changes -  trunk  Actinic Damage - Chronic condition, secondary to cumulative UV/sun exposure - diffuse scaly erythematous macules with underlying dyspigmentation - Recommend daily broad spectrum sunscreen SPF 30+ to sun-exposed areas, reapply every 2 hours as needed.  - Staying in the shade or wearing long sleeves, sun glasses (UVA+UVB protection) and wide brim hats (4-inch brim around the entire circumference of the hat) are also recommended for sun protection.  - Call for new or changing lesions.  Skin cancer screening performed today.   History of Basal Cell Carcinoma of the Skin - No evidence of recurrence today - Recommend regular full body skin exams - Recommend daily broad spectrum sunscreen SPF 30+ to sun-exposed areas, reapply every 2 hours as needed.  - Call if any new or changing lesions are noted between office visits  - R lower lip  History of Dysplastic Nevi - No evidence of recurrence today - Recommend regular full body skin exams - Recommend daily broad spectrum sunscreen SPF 30+ to sun-exposed areas, reapply every 2 hours as needed.  - Call if any new or changing lesions are noted between office visits  - L mid back, R spinal mid back, R upper arm  History of melanoma Spinal mid back  Breslows 0.28mm, Level II, [place] 1A 06/29/19  Clear. Observe for recurrence. Call clinic for new or changing lesions.  Recommend regular skin exams, daily broad-spectrum spf 30+ sunscreen use, and photoprotection.     Nevus (3) L spinal  upper back; R paraspinal mid upper back; R spinal mid back  Benign-appearing. Stable compared to previous visit. Observation.  Call clinic for new or changing moles.  Recommend daily use of broad spectrum spf 30+ sunscreen to sun-exposed areas.    Seborrheic keratosis R neck  Vs Nevus  Benign-appearing.  Observation.  Call clinic for new or changing lesions.  Recommend daily use of broad spectrum spf 30+ sunscreen to sun-exposed areas.    AK (actinic  keratosis) (8) scalp x 5, R temple x 3  Vs ISK  Destruction of lesion - scalp x 5, R temple x 3  Destruction method: cryotherapy   Informed consent: discussed and consent obtained   Lesion destroyed using liquid nitrogen: Yes   Region frozen until ice ball extended beyond lesion: Yes   Outcome: patient tolerated procedure well with no complications   Post-procedure details: wound care instructions given   Additional details:  Prior to procedure, discussed risks of blister formation, small wound, skin dyspigmentation, or rare scar following cryotherapy. Recommend Vaseline ointment to treated areas while healing.   Folliculitis R occipital scalp  Benign-appearing.  Observation.  Call clinic for new or changing lesions.  Recommend daily use of broad spectrum spf 30+ sunscreen to sun-exposed areas.     Return in about 6 months (around 10/10/2022) for UBSE, Hx of Melanoma, Hx of BCC, Hx of Dysplastic nevi, Hx of AKs.  I, Othelia Pulling, RMA, am acting as scribe for [name], MD .  Documentation: I have reviewed the above documentation for accuracy and completeness, and I agree with the above.  [name] MD

## 2022-04-09 NOTE — Patient Instructions (Signed)
Due to recent changes in healthcare laws, you may see results of your pathology and/or laboratory studies on MyChart before the doctors have had a chance to review them. We understand that in some cases there may be results that are confusing or concerning to you. Please understand that not all results are received at the same time and often the doctors may need to interpret multiple results in order to provide you with the best plan of care or course of treatment. Therefore, we ask that you please give us 2 business days to thoroughly review all your results before contacting the office for clarification. Should we see a critical lab result, you will be contacted sooner.   If You Need Anything After Your Visit  If you have any questions or concerns for your doctor, please call our main line at [phone] and press option 4 to reach your doctor's medical assistant. If no one answers, please leave a voicemail as directed and we will return your call as soon as possible. Messages left after 4 pm will be answered the following business day.   You may also send us a message via MyChart. We typically respond to MyChart messages within 1-2 business days.  For prescription refills, please ask your pharmacy to contact our office. Our fax number is [phone].  If you have an urgent issue when the clinic is closed that cannot wait until the next business day, you can page your doctor at the number below.    Please note that while we do our best to be available for urgent issues outside of office hours, we are not available 24/7.   If you have an urgent issue and are unable to reach us, you may choose to seek medical care at your doctor's office, retail clinic, urgent care center, or emergency room.  If you have a medical emergency, please immediately call 911 or go to the emergency department.  Pager Numbers  - Dr. Kowalski: [phone]  - Dr. Moye: [phone]  - Dr. Stewart:  [phone]  In the event of inclement weather, please call our main line at [phone] for an update on the status of any delays or closures.  Dermatology Medication Tips: Please keep the boxes that topical medications come in in order to help keep track of the instructions about where and how to use these. Pharmacies typically print the medication instructions only on the boxes and not directly on the medication tubes.   If your medication is too expensive, please contact our office at [phone] option 4 or send us a message through MyChart.   We are unable to tell what your co-pay for medications will be in advance as this is different depending on your insurance coverage. However, we may be able to find a substitute medication at lower cost or fill out paperwork to get insurance to cover a needed medication.   If a prior authorization is required to get your medication covered by your insurance company, please allow us 1-2 business days to complete this process.  Drug prices often vary depending on where the prescription is filled and some pharmacies may offer cheaper prices.  The website www.goodrx.com contains coupons for medications through different pharmacies. The prices here do not account for what the cost may be with help from insurance (it may be cheaper with your insurance), but the website can give you the price if you did not use any insurance.  - You can print the associated coupon and take it with   your prescription to the pharmacy.  - You may also stop by our office during regular business hours and pick up a GoodRx coupon card.  - If you need your prescription sent electronically to a different pharmacy, notify our office through [facility] MyChart or by phone at [phone] option 4.     Si Usted Necesita Algo Despus de Su Visita  Tambin puede enviarnos un mensaje a travs de MyChart. Por lo general respondemos a los mensajes de MyChart en el transcurso de 1 a 2  das hbiles.  Para renovar recetas, por favor pida a su farmacia que se ponga en contacto con nuestra oficina. Nuestro nmero de fax es el [phone].  Si tiene un asunto urgente cuando la clnica est cerrada y que no puede esperar hasta el siguiente da hbil, puede llamar/localizar a su doctor(a) al nmero que aparece a continuacin.   Por favor, tenga en cuenta que aunque hacemos todo lo posible para estar disponibles para asuntos urgentes fuera del horario de oficina, no estamos disponibles las 24 horas del da, los 7 das de la semana.   Si tiene un problema urgente y no puede comunicarse con nosotros, puede optar por buscar atencin mdica  en el consultorio de su doctor(a), en una clnica privada, en un centro de atencin urgente o en una sala de emergencias.  Si tiene una emergencia mdica, por favor llame inmediatamente al 911 o vaya a la sala de emergencias.  Nmeros de bper  - Dr. Kowalski: [phone]  - Dra. Moye: [phone]  - Dra. Stewart: [phone]  En caso de inclemencias del tiempo, por favor llame a nuestra lnea principal al [phone] para una actualizacin sobre el estado de cualquier retraso o cierre.  Consejos para la medicacin en dermatologa: Por favor, guarde las cajas en las que vienen los medicamentos de uso tpico para ayudarle a seguir las instrucciones sobre dnde y cmo usarlos. Las farmacias generalmente imprimen las instrucciones del medicamento slo en las cajas y no directamente en los tubos del medicamento.   Si su medicamento es muy caro, por favor, pngase en contacto con nuestra oficina llamando al [phone] y presione la opcin 4 o envenos un mensaje a travs de MyChart.   No podemos decirle cul ser su copago por los medicamentos por adelantado ya que esto es diferente dependiendo de la cobertura de su seguro. Sin embargo, es posible que podamos encontrar un medicamento sustituto a menor costo o llenar un formulario para que el  seguro cubra el medicamento que se considera necesario.   Si se requiere una autorizacin previa para que su compaa de seguros cubra su medicamento, por favor permtanos de 1 a 2 das hbiles para completar este proceso.  Los precios de los medicamentos varan con frecuencia dependiendo del lugar de dnde se surte la receta y alguna farmacias pueden ofrecer precios ms baratos.  El sitio web www.goodrx.com tiene cupones para medicamentos de diferentes farmacias. Los precios aqu no tienen en cuenta lo que podra costar con la ayuda del seguro (puede ser ms barato con su seguro), pero el sitio web puede darle el precio si no utiliz ningn seguro.  - Puede imprimir el cupn correspondiente y llevarlo con su receta a la farmacia.  - Tambin puede pasar por nuestra oficina durante el horario de atencin regular y recoger una tarjeta de cupones de GoodRx.  - Si necesita que su receta se enve electrnicamente a una farmacia diferente, informe a nuestra oficina a travs de MyChart de [facility]   o por telfono llamando al [phone] y presione la opcin 4.  

## 2022-05-09 NOTE — Progress Notes (Unsigned)
05/10/2022 11:46 AM   Mike Patel 04/08/1960 [id number]  Referring provider: Jerl Mina, MD [address] [place],  [place] [address]  Urological history: 1. BPH with LU TS -PSA (02/2022) 1.9 -TURP (10/2013)  2.  Hypogonadism -Contributing factors of age, obesity, diabetes and sleep apnea -Testosterone (02/2022) 692 -Hematocrit (02/2022) 53.5 -Testosterone cypionate 200 mg/milliliters, 0.5 mL every 7 days  3.  Erectile dysfunction -Continue factors of age, obesity, diabetes, sleep apnea, hypertension, BPH, hypogonadism, hyperlipidemia, degenerative disc disease and arthrosclerosis -Sildenafil 20 mg, on-demand dosing  Chief Complaint  Patient presents with   Erectile Dysfunction    HPI: Mike Patel is a 62 y.o. male who presents today for discussion of erectile dysfunction treatments.  SHIM 10  Patient still having spontaneous erections, but they are not firm enough for penetration.  He denies any pain or curvature with erections.    He has tried 100 mg of sildenafil and 20 mg of tadalafil without achieving satisfactory erections for intercourse.  His serum testosterone is at therapeutic levels and he has good libido.   SHIM     Row Name 05/10/22 1052         SHIM: Over the last 6 months:   How do you rate your confidence that you could get and keep an erection? Low     When you had erections with sexual stimulation, how often were your erections hard enough for penetration (entering your partner)? Almost Never or Never     During sexual intercourse, how often were you able to maintain your erection after you had penetrated (entered) your partner? Almost Never or Never     During sexual intercourse, how difficult was it to maintain your erection to completion of intercourse? Difficult     When you attempted sexual intercourse, how often was it satisfactory for you? Sometimes (about half the time)       SHIM Total Score   SHIM 10               Score: 1-7 Severe ED 8-11 Moderate ED 12-16 Mild-Moderate ED 17-21 Mild ED 22-25 No ED    PMH: Past Medical History:  Diagnosis Date   Actinic keratosis    Aortic atherosclerosis    Arthritis    BACK   Basal cell carcinoma 05/19/2019   Right lower lip. Nodular and infiltrative.   DDD (degenerative disc disease), cervical    Diabetes mellitus without complication    Dysplastic nevus 07/14/2019   Left mid back. Moderate atypia, limited margins free.   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 Moderate atypia, deep margin involved.   Dysplastic nevus 07/14/2019   Right upper arm. Moderate atypia, deep margin involved.    GERD (gastroesophageal reflux disease)    Gout    Hypertension    Melanemia    Melanoma 06/29/2019   0.33mm Level II,  spinal mid back WLE, [place]   Nausea vomiting and diarrhea 03/20/2022   Obesity    Pre-diabetes    Sleep apnea    cpap    Surgical History: Past Surgical History:  Procedure Laterality Date   BACK SURGERY  2018   x3   CHOLECYSTECTOMY N/A 12/02/2017   Procedure: LAPAROSCOPIC CHOLECYSTECTOMY WITH INTRAOPERATIVE CHOLANGIOGRAM;  Surgeon: Mike Dodge, MD;  Location: [facility] ORS;  Service: General;  Laterality: N/A;   COLONOSCOPY N/A 05/24/2014   Procedure: COLONOSCOPY;  Surgeon: Mike Cullens, MD;  Location: [facility] ENDOSCOPY;  Service:  Gastroenterology;  Laterality: N/A;   COLONOSCOPY WITH PROPOFOL N/A 05/30/2020   Procedure: COLONOSCOPY WITH PROPOFOL;  Surgeon: Toledo, Mike Nearing, MD;  Location: [facility] ENDOSCOPY;  Service: Gastroenterology;  Laterality: N/A;   ELBOW SURGERY Right    tennis elbow   ERCP N/A 11/18/2017   Procedure: ENDOSCOPIC RETROGRADE CHOLANGIOPANCREATOGRAPHY (ERCP);  Surgeon: Mike Minium, MD;  Location: [facility] ENDOSCOPY;  Service: Endoscopy;  Laterality: N/A;   ESOPHAGOGASTRODUODENOSCOPY N/A 05/24/2014   Procedure: ESOPHAGOGASTRODUODENOSCOPY (EGD);  Surgeon: Mike Cullens, MD;  Location: [facility] ENDOSCOPY;   Service: Gastroenterology;  Laterality: N/A;   PROSTATE SURGERY  2015   SPINE SURGERY     l-spine x 2   TOTAL HIP ARTHROPLASTY Right 12/19/2020   Procedure: TOTAL HIP ARTHROPLASTY ANTERIOR APPROACH;  Surgeon: Mike Herrlich, MD;  Location: [facility] ORS;  Service: Orthopedics;  Laterality: Right;    Home Medications:  Allergies as of 05/10/2022       Reactions   Percocet [oxycodone-acetaminophen] Itching   Tape Rash   Dermabond skin glue and Tape        Medication List        Accurate as of May 10, 2022 11:46 AM. If you have any questions, ask your nurse or doctor.          STOP taking these medications    gabapentin 300 MG capsule Commonly known as: NEURONTIN Stopped by: Mike Cowboy, PA-C       TAKE these medications    aspirin EC 81 MG tablet Take by mouth.   Caverject Impulse 20 MCG injection Generic drug: alprostadil 20 mcg by Intracavitary route as needed for erectile dysfunction. use no more than 3 times per week Started by: Mike Braun, PA-C   cyanocobalamin 1000 MCG/ML injection Commonly known as: VITAMIN B12 Inject 1,000 mcg into the muscle every 30 (thirty) days.   Fish Oil 1000 MG Caps Take 2,000 mg by mouth daily.   fluticasone 50 MCG/ACT nasal spray Commonly known as: FLONASE Place 2 sprays into both nostrils in the morning and at bedtime.   lansoprazole 30 MG capsule Commonly known as: PREVACID Take 30 mg by mouth daily.   lisinopril 40 MG tablet Commonly known as: ZESTRIL Take 1 tablet (40 mg total) by mouth daily.   Melatonin 10 MG Tabs Take 10 mg by mouth at bedtime as needed (sleep).   metoprolol succinate 50 MG 24 hr tablet Commonly known as: TOPROL-XL Take 100 mg by mouth daily. Take with or immediately following a meal.   OneTouch Verio test strip Generic drug: glucose blood 1 each daily.   pravastatin 10 MG tablet Commonly known as: PRAVACHOL Take 10 mg by mouth at bedtime.   testosterone cypionate 200 MG/ML  injection Commonly known as: DEPOTESTOSTERONE CYPIONATE Inject 0.5 mLs (100 mg total) into the muscle every 7 (seven) days.   traZODone 50 MG tablet Commonly known as: DESYREL Take 50 mg by mouth at bedtime.   Vitamin D-3 125 MCG (5000 UT) Tabs Take 5,000 Units by mouth 2 (two) times daily.   zinc gluconate 50 MG tablet Take 50 mg by mouth daily.        Allergies:  Allergies  Allergen Reactions   Percocet [Oxycodone-Acetaminophen] Itching   Tape Rash    Dermabond skin glue and Tape    Family History: Family History  Problem Relation Age of Onset   Hypertension Mother     Social History:  reports that he has never smoked. He has never used smokeless tobacco. He reports  that he does not drink alcohol and does not use drugs.  ROS: Pertinent ROS in HPI  Physical Exam: BP (!) 175/91   Pulse 73   Ht  (1.778 m)   Wt 274 lb (124.3 kg)   BMI 39.31 kg/m   Constitutional:  Well nourished. Alert and oriented, No acute distress. HEENT: [place] AT, moist mucus membranes.  Trachea midline Cardiovascular: No clubbing, cyanosis, or edema. Respiratory: Normal respiratory effort, no increased work of breathing. GU: No CVA tenderness.  No bladder fullness or masses.  Patient with uncircumcised phallus. Foreskin easily retracted  Urethral meatus is patent.  No penile discharge. No penile lesions or rashes. Scrotum without lesions, cysts, rashes and/or edema.  Testicles are located scrotally bilaterally.  No masses are appreciated in the testicles. Left and right epididymis are normal. Neurologic: Grossly intact, no focal deficits, moving all 4 extremities. Psychiatric: Normal mood and affect.  Laboratory Data: Lab Results  Component Value Date   WBC 5.6 03/21/2022   HGB 13.5 03/21/2022   HCT 40.4 03/21/2022   MCV 94.0 03/21/2022   PLT 154 03/21/2022    Lab Results  Component Value Date   CREATININE 0.83 03/21/2022   Lab Results  Component Value Date   TESTOSTERONE 692  03/12/2022    Lab Results  Component Value Date   AST 26 03/20/2022   Lab Results  Component Value Date   ALT 25 03/20/2022  I have reviewed the labs.   Pertinent Imaging: N/A  Assessment & Plan:    1. Erectile dysfunction - I explained to the patient that in order to achieve an erection it takes good functioning of the nervous system (parasympathetic and rs, sympathetic, sensory and motor), good blood flow into the erectile tissue of the penis and a desire to have sex - I explained that conditions like diabetes, hypertension, coronary artery disease, peripheral vascular disease, smoking, alcohol consumption, age, sleep apnea and BPH can diminish the ability to have an erection - A recent study published in Sex Med 2018 Apr 13 revealed moderate to vigorous aerobic exercise for 40 minutes 4 times per week can decrease erectile problems caused by physical inactivity, obesity, hypertension, metabolic syndrome and/or cardiovascular diseases - he has lost 40 lbs - We discussed trying intracavernous vasoactive drug injection therapy and penile prosthesis implantation  -He would like to have an appointment for ICI titration -It appears that his insurance will cover Caverject with a prior authorization, so we have sent the prescription in and follow-up will be based on insurance coverage -If his insurance covers the Caverject and it is not cost prohibitive, we will schedule a ICI titration with Caverject -If his insurance denies coverage or his part of the cost is not feasible, we will go ahead and schedule an ICI titration with Trimix and the Trimix [facility]  Return for follow up pending Caverject PA .  These notes generated with voice recognition software. I apologize for typographical errors.  Cloretta Ned  [facility] [address]  [address] [place], [place] [address] [phone]  I spent 30  minutes on the day of the encounter to include pre-visit record review, face-to-face time with the patient, and post-visit ordering of tests.

## 2022-05-10 ENCOUNTER — Encounter: Payer: Self-pay | Admitting: Urology

## 2022-05-10 ENCOUNTER — Ambulatory Visit (INDEPENDENT_AMBULATORY_CARE_PROVIDER_SITE_OTHER): Payer: BC Managed Care – PPO | Admitting: Urology

## 2022-05-10 VITALS — BP 175/91 | HR 73 | Ht 70.0 in | Wt 274.0 lb

## 2022-05-10 DIAGNOSIS — N5201 Erectile dysfunction due to arterial insufficiency: Secondary | ICD-10-CM

## 2022-05-10 MED ORDER — CAVERJECT IMPULSE 20 MCG IC KIT: 20.0000 ug | PACK | INTRACAVERNOUS | 3 refills | Status: DC | PRN

## 2022-05-18 ENCOUNTER — Other Ambulatory Visit: Payer: Self-pay | Admitting: Family Medicine

## 2022-05-18 ENCOUNTER — Telehealth: Payer: Self-pay

## 2022-05-18 MED ORDER — TADALAFIL 20 MG PO TABS: 20.0000 mg | ORAL_TABLET | Freq: Every day | ORAL | 2 refills | Status: AC | PRN

## 2022-05-18 NOTE — Telephone Encounter (Signed)
Spoke to patient and Caverject is not approved by insurance. Tadalafil was sent to pharmacy. He will use the Goodrx coupon.

## 2022-05-18 NOTE — Telephone Encounter (Signed)
Pt LM on triage line requesting update on prior auth.

## 2022-05-21 ENCOUNTER — Other Ambulatory Visit: Payer: Self-pay | Admitting: Family Medicine

## 2022-05-21 ENCOUNTER — Telehealth: Payer: Self-pay

## 2022-05-21 MED ORDER — AMBULATORY NON FORMULARY MEDICATION: 0 refills | Status: DC

## 2022-05-21 NOTE — Telephone Encounter (Signed)
Patient called to let us know that he would like to proceed with Caverjact medication from the [facility] pharmacy that [place] told patient about. Please update patient on this as possible. [phone] (Mobile)

## 2022-05-21 NOTE — Telephone Encounter (Signed)
Spoke to patient and medication has been faxed to [facility].

## 2022-06-01 NOTE — Progress Notes (Unsigned)
06/04/2022 9:45 AM   Mike Patel 09-25-1960 [id number]  Referring provider: Jerl Mina, MD [address] [place],  [place] [address]  Urological history: 1. BPH with LU TS -PSA (02/2022) 1.9 -TURP (10/2013)  2.  Hypogonadism -Contributing factors of age, obesity, diabetes and sleep apnea -Testosterone (02/2022) 692 -Hematocrit (02/2022) 40.4 -Testosterone cypionate 200 mg/milliliters, 0.5 mL every 7 days  3.  Erectile dysfunction -Continue factors of age, obesity, diabetes, sleep apnea, hypertension, BPH, hypogonadism, hyperlipidemia, degenerative disc disease and arthrosclerosis -Sildenafil 20 mg, on-demand dosing  Chief Complaint  Patient presents with   Erectile Dysfunction    HPI: Mike Patel is a 62 y.o. male who presents today for Trimix titration with his friend, [name].   The procedure is discussed with patient.  He is allowed to ask questions.  Questions were answered to his satisfaction.  We were able to proceed to the titration.  PMH: Past Medical History:  Diagnosis Date   Actinic keratosis    Aortic atherosclerosis (HCC)    Arthritis    BACK   Basal cell carcinoma 05/19/2019   Right lower lip. Nodular and infiltrative.   DDD (degenerative disc disease), cervical    Diabetes mellitus without complication (HCC)    Dysplastic nevus 07/14/2019   Left mid back. Moderate atypia, limited margins free.   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 Moderate atypia, deep margin involved.   Dysplastic nevus 07/14/2019   Right upper arm. Moderate atypia, deep margin involved.    GERD (gastroesophageal reflux disease)    Gout    Hypertension    Melanemia    Melanoma (HCC) 06/29/2019   0.42mm Level II,  spinal mid back WLE, [place]   Nausea vomiting and diarrhea 03/20/2022   Obesity    Pre-diabetes    Sleep apnea    cpap    Surgical History: Past Surgical History:  Procedure Laterality Date   BACK SURGERY   2018   x3   CHOLECYSTECTOMY N/A 12/02/2017   Procedure: LAPAROSCOPIC CHOLECYSTECTOMY WITH INTRAOPERATIVE CHOLANGIOGRAM;  Surgeon: Henrene Dodge, MD;  Location: [facility] ORS;  Service: General;  Laterality: N/A;   COLONOSCOPY N/A 05/24/2014   Procedure: COLONOSCOPY;  Surgeon: Wallace Cullens, MD;  Location: [facility] ENDOSCOPY;  Service: Gastroenterology;  Laterality: N/A;   COLONOSCOPY WITH PROPOFOL N/A 05/30/2020   Procedure: COLONOSCOPY WITH PROPOFOL;  Surgeon: Toledo, [name], MD;  Location: [facility] ENDOSCOPY;  Service: Gastroenterology;  Laterality: N/A;   ELBOW SURGERY Right    tennis elbow   ERCP N/A 11/18/2017   Procedure: ENDOSCOPIC RETROGRADE CHOLANGIOPANCREATOGRAPHY (ERCP);  Surgeon: Midge Minium, MD;  Location: [facility] ENDOSCOPY;  Service: Endoscopy;  Laterality: N/A;   ESOPHAGOGASTRODUODENOSCOPY N/A 05/24/2014   Procedure: ESOPHAGOGASTRODUODENOSCOPY (EGD);  Surgeon: Wallace Cullens, MD;  Location: [facility] ENDOSCOPY;  Service: Gastroenterology;  Laterality: N/A;   PROSTATE SURGERY  2015   SPINE SURGERY     l-spine x 2   TOTAL HIP ARTHROPLASTY Right 12/19/2020   Procedure: TOTAL HIP ARTHROPLASTY ANTERIOR APPROACH;  Surgeon: Lyndle Herrlich, MD;  Location: [facility] ORS;  Service: Orthopedics;  Laterality: Right;    Home Medications:  Allergies as of 06/04/2022       Reactions   Percocet [oxycodone-acetaminophen] Itching   Tape Rash   Dermabond skin glue and Tape        Medication List        Accurate as of Jun 04, 2022  9:45 AM. If you have any  questions, ask your nurse or doctor.          AMBULATORY NON FORMULARY MEDICATION Trimix (30/1/10)-(Pap/Phent/PGE)  Test Dose  1ml vial   Qty #3 Refills 0  [facility] [phone] Fax [phone]   amLODipine 10 MG tablet Commonly known as: NORVASC Take 10 mg by mouth daily.   aspirin EC 81 MG tablet Take by mouth.   cyanocobalamin 1000 MCG/ML injection Commonly known as: VITAMIN B12 Inject 1,000 mcg into the muscle every 30  (thirty) days.   Fish Oil 1000 MG Caps Take 2,000 mg by mouth daily.   fluticasone 50 MCG/ACT nasal spray Commonly known as: FLONASE Place 2 sprays into both nostrils in the morning and at bedtime.   hydrochlorothiazide 12.5 MG tablet Commonly known as: HYDRODIURIL Take 12.5 mg by mouth daily.   lansoprazole 30 MG capsule Commonly known as: PREVACID Take 30 mg by mouth daily.   lisinopril 40 MG tablet Commonly known as: ZESTRIL Take 1 tablet (40 mg total) by mouth daily.   Melatonin 10 MG Tabs Take 10 mg by mouth at bedtime as needed (sleep).   meloxicam 15 MG tablet Commonly known as: MOBIC Take 15 mg by mouth daily.   metFORMIN 500 MG 24 hr tablet Commonly known as: GLUCOPHAGE-XR SMARTSIG:2 Tablet(s) By Mouth Every Evening   metoprolol succinate 50 MG 24 hr tablet Commonly known as: TOPROL-XL Take 100 mg by mouth daily. Take with or immediately following a meal.   OneTouch Verio test strip Generic drug: glucose blood 1 each daily.   pravastatin 10 MG tablet Commonly known as: PRAVACHOL Take 10 mg by mouth at bedtime.   tadalafil 20 MG tablet Commonly known as: CIALIS Take 1 tablet (20 mg total) by mouth daily as needed for erectile dysfunction.   testosterone cypionate 200 MG/ML injection Commonly known as: DEPOTESTOSTERONE CYPIONATE Inject 0.5 mLs (100 mg total) into the muscle every 7 (seven) days.   traZODone 50 MG tablet Commonly known as: DESYREL Take 50 mg by mouth at bedtime.   Vitamin D-3 125 MCG (5000 UT) Tabs Take 5,000 Units by mouth 2 (two) times daily.   zinc gluconate 50 MG tablet Take 50 mg by mouth daily.        Allergies:  Allergies  Allergen Reactions   Percocet [Oxycodone-Acetaminophen] Itching   Tape Rash    Dermabond skin glue and Tape    Family History: Family History  Problem Relation Age of Onset   Hypertension Mother     Social History:  reports that he has never smoked. He has never used smokeless tobacco. He  reports that he does not drink alcohol and does not use drugs.  ROS: Pertinent ROS in HPI  Physical Exam: @VS @  Constitutional:  Well nourished. Alert and oriented, No acute distress. GU: No CVA tenderness.  No bladder fullness or masses.  Patient with uncircumcised phallus. Foreskin easily retracted  Urethral meatus is patent.  No penile discharge. No penile lesions or rashes. Scrotum without lesions, cysts, rashes and/or edema.   Psychiatric: Normal mood and affect.  Laboratory Data: N/A  Pertinent Imaging: N/A   Procedure Patient's left corpus cavernosum is identified.  An area near the base of the penis is cleansed with rubbing alcohol.  Careful to avoid the dorsal vein, 0.4 of Trimix (papaverine 30 mg, phentolamine 1 mg and prostaglandin E1 10 mcg, Lot # [id number]$  exp # 06/24/2022 is injected at a 90 degree angle into the left corpus cavernosum near the base of the  penis.  Patient experienced penile fullness in 15 minutes.    Patient's right corpus cavernosum is identified.  An area near the base of the penis is cleansed with rubbing alcohol.  Careful to avoid the dorsal vein, 0.2 of Trimix (papaverine 30 mg, phentolamine 1 mg and prostaglandin E1 10 mcg, Lot # [id number]$  exp # 06/24/2022 is injected at a 90 degree angle into the right corpus cavernosum near the base of the penis.  Patient experienced penile fullness in 15 minutes.      Assessment & Plan:    1. Erectile dysfunction -At this time, we decided to stop the titration process as he feels comfortable injecting himself -He will start the injections with 0.6 of the Trimix, I advised him not to increase his injections by 0.1 with subsequent injections if he does not receive the response he desires with a 0.6 -If he does not have satisfactory intercourse with this potency of Trimix, he will contact the office and we will initiate the process again with a higher potency of Trimix Advised patient of the condition of  priapism, painful erection lasting for more than four hours, and to contact the office or seek treatment in the ED immediately   2. Hypogonadism -repeat labs in August -Testosterone cypionate 200 mg/milliliters, 0.5 mL every 7 days  No follow-ups on file.  These notes generated with voice recognition software. I apologize for typographical errors.  Cloretta Ned  [facility] [address]  [address] [place], [place] [address] [phone]

## 2022-06-04 ENCOUNTER — Encounter: Payer: Self-pay | Admitting: Urology

## 2022-06-04 ENCOUNTER — Ambulatory Visit (INDEPENDENT_AMBULATORY_CARE_PROVIDER_SITE_OTHER): Payer: BC Managed Care – PPO | Admitting: Urology

## 2022-06-04 VITALS — BP 132/75 | HR 69

## 2022-06-04 DIAGNOSIS — E291 Testicular hypofunction: Secondary | ICD-10-CM | POA: Diagnosis not present

## 2022-06-04 DIAGNOSIS — N5201 Erectile dysfunction due to arterial insufficiency: Secondary | ICD-10-CM

## 2022-06-04 NOTE — Patient Instructions (Signed)
TRIMIX SELF-INJECTION INSTRUCTIONS    DETAILED PROCEDURE  1. GETTING SET UP  A. Proper hygiene is important. Wash your hands and keep the penis clean.  B. Assemble the following:  - Bottle of Trimix  - Alcohol pad  - Syringe  C. Keep the Trimix cold by returning the bottle to the refrigerator, or by placing the bottle in a cup of ice.   2. PREPARE THE SYRINGE  A. Wipe the rubber top of the vial with an alcohol pad.  B. After removing the cap of the needle, pull the plunger back to the desired dosage, filling this volume with air. Use a new needle and syringe each time.  C. Insert the needle through the rubber top and inject the air into the vial.  D. Turn the vial with needle and syringe inserted upside down. Pull back on the syringe plunger in a slow and steady motion until the desired dosage is achieved.  E. Tap the side of the syringe (1cc tuberculin syringe with a 29 gauge needle) to allow any air bubbles to float towards the needle. Avoid having these air bubbles in the syringe when self-injecting by first injecting out the collected bubbles that may form.  F. Remove the needle from the bottle and replace the protective cap on the needle.    3. SELECT AND PREPARE THE SITE FOR INJECTION  A. The proper location for injection is at the 9-11 and 1-3 o'clock positions, between the base and mid-portion of the penis.(see diagram) Avoid the midline because of potential for injury to the urethra (6 o'clock; for urinary passage) and the penile arteries and nerves (near 12 o'clock). Avoid any visible veins or arteries on the surface.  B. Grasp and pull the head of the penis toward the side of your leg with the index finger and thumb (use the left hand, if right handed). While maintaining light tension, select a site for injection.  C. Clean the site with an alcohol pad.   4: INJECT TRIMIX AND APPLY COMPRESSION  A. With a steady and continuous motion, penetrate the skin with the needle at a 90 o  angle. The needle should then be advanced to the hub. Slight resistance is encountered as the needle passes into the proper position within the erectile tissue (corporeal body).  B. Inject the Trimix over approximately 4 seconds. Withdraw the needle from the penis and apply compression to the injection site for approximately 1 minute. Several minutes of compression may be required to avoid bleeding, especially if you are an aspirin user.  C. Replace the cap on the needle and dispose of properly.   If you experience a painful erection that will not go down, take four (30 mg) tablets of pseudoephedrine (Sudafed-not the extended release) and if the erection does not go down in the next hour or increases in pain, contact the office immediately or seek treatment in the ED    

## 2022-06-13 ENCOUNTER — Telehealth: Payer: Self-pay | Admitting: *Deleted

## 2022-06-13 NOTE — Telephone Encounter (Signed)
Patient calling stating that the 0.6 of Trimix was not hard enough to penetrate, patient asking for increase.  (Ok to leave msg, pt starting to mow the lawn.)

## 2022-06-14 MED ORDER — AMBULATORY NON FORMULARY MEDICATION: 0 refills | Status: DC

## 2022-06-14 NOTE — Telephone Encounter (Signed)
RX printed and appointment for titration made.

## 2022-06-20 ENCOUNTER — Other Ambulatory Visit: Payer: Self-pay | Admitting: Family Medicine

## 2022-06-20 ENCOUNTER — Telehealth: Payer: Self-pay | Admitting: Urology

## 2022-06-20 MED ORDER — AMBULATORY NON FORMULARY MEDICATION: 0 refills | Status: DC

## 2022-06-20 NOTE — Telephone Encounter (Signed)
Patient called and stated that [facility] told him they have not received request for Trimix. He is scheduled to come in to see [facility] on 06/25/22. Please advise patient.

## 2022-06-20 NOTE — Telephone Encounter (Signed)
LMOM informed patient the RX was faxed again.

## 2022-06-22 NOTE — Progress Notes (Unsigned)
06/25/2022 8:38 AM   Mike Patel 02/10/1960 [id number]  Referring provider: Jerl Mina, MD [address] [place],  [place] [address]  Urological history: 1. BPH with LU TS -PSA (02/2022) 1.9 -TURP (10/2013)  2.  Hypogonadism -Contributing factors of age, obesity, diabetes and sleep apnea -Testosterone (02/2022) 692 -Hematocrit (02/2022) 40.4 -Testosterone cypionate 200 mg/milliliters, 0.5 mL every 7 days  3.  Erectile dysfunction -Continue factors of age, obesity, diabetes, sleep apnea, hypertension, BPH, hypogonadism, hyperlipidemia, degenerative disc disease and arthrosclerosis -Sildenafil 20 mg, on-demand dosing  No chief complaint on file.   HPI: Mike Patel is a 62 y.o. male who presents today for Trimix titration with Trimix (30/1/50) his friend, [name].   At his visit on 06/04/2022, we titrated up to 0.6 of the Trimix (30/1/10) w/o achieving a satisfactory erection.  He then injected 0.6 of the Trimix (30/1/10) at home and still did not achieve a satisfactory erection.  The procedure is discussed with patient.  He is allowed to ask questions.  Questions were answered to his satisfaction.  We were able to proceed to the titration.  PMH: Past Medical History:  Diagnosis Date   Actinic keratosis    Aortic atherosclerosis (HCC)    Arthritis    BACK   Basal cell carcinoma 05/19/2019   Right lower lip. Nodular and infiltrative.   DDD (degenerative disc disease), cervical    Diabetes mellitus without complication (HCC)    Dysplastic nevus 07/14/2019   Left mid back. Moderate atypia, limited margins free.   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 Moderate atypia, deep margin involved.   Dysplastic nevus 07/14/2019   Right upper arm. Moderate atypia, deep margin involved.    GERD (gastroesophageal reflux disease)    Gout    Hypertension    Melanemia    Melanoma (HCC) 06/29/2019   0.35mm Level II,  spinal mid back WLE,  [place]   Nausea vomiting and diarrhea 03/20/2022   Obesity    Pre-diabetes    Sleep apnea    cpap    Surgical History: Past Surgical History:  Procedure Laterality Date   BACK SURGERY  2018   x3   CHOLECYSTECTOMY N/A 12/02/2017   Procedure: LAPAROSCOPIC CHOLECYSTECTOMY WITH INTRAOPERATIVE CHOLANGIOGRAM;  Surgeon: Henrene Dodge, MD;  Location: [facility] ORS;  Service: General;  Laterality: N/A;   COLONOSCOPY N/A 05/24/2014   Procedure: COLONOSCOPY;  Surgeon: Wallace Cullens, MD;  Location: [facility] ENDOSCOPY;  Service: Gastroenterology;  Laterality: N/A;   COLONOSCOPY WITH PROPOFOL N/A 05/30/2020   Procedure: COLONOSCOPY WITH PROPOFOL;  Surgeon: Toledo, [name], MD;  Location: [facility] ENDOSCOPY;  Service: Gastroenterology;  Laterality: N/A;   ELBOW SURGERY Right    tennis elbow   ERCP N/A 11/18/2017   Procedure: ENDOSCOPIC RETROGRADE CHOLANGIOPANCREATOGRAPHY (ERCP);  Surgeon: Midge Minium, MD;  Location: [facility] ENDOSCOPY;  Service: Endoscopy;  Laterality: N/A;   ESOPHAGOGASTRODUODENOSCOPY N/A 05/24/2014   Procedure: ESOPHAGOGASTRODUODENOSCOPY (EGD);  Surgeon: Wallace Cullens, MD;  Location: [facility] ENDOSCOPY;  Service: Gastroenterology;  Laterality: N/A;   PROSTATE SURGERY  2015   SPINE SURGERY     l-spine x 2   TOTAL HIP ARTHROPLASTY Right 12/19/2020   Procedure: TOTAL HIP ARTHROPLASTY ANTERIOR APPROACH;  Surgeon: Lyndle Herrlich, MD;  Location: [facility] ORS;  Service: Orthopedics;  Laterality: Right;    Home Medications:  Allergies as of 06/25/2022       Reactions   Percocet [oxycodone-acetaminophen] Itching   Tape Rash  Dermabond skin glue and Tape        Medication List        Accurate as of Jun 22, 2022  8:38 AM. If you have any questions, ask your nurse or doctor.          AMBULATORY NON FORMULARY MEDICATION Trimix (30/1/50)-(Pap/Phent/PGE)  Test Dose  1ml vial   Qty #3 Refills 0  [facility] [phone] Fax [phone]   amLODipine 10 MG tablet Commonly known as:  NORVASC Take 10 mg by mouth daily.   aspirin EC 81 MG tablet Take by mouth.   cyanocobalamin 1000 MCG/ML injection Commonly known as: VITAMIN B12 Inject 1,000 mcg into the muscle every 30 (thirty) days.   Fish Oil 1000 MG Caps Take 2,000 mg by mouth daily.   fluticasone 50 MCG/ACT nasal spray Commonly known as: FLONASE Place 2 sprays into both nostrils in the morning and at bedtime.   hydrochlorothiazide 12.5 MG tablet Commonly known as: HYDRODIURIL Take 12.5 mg by mouth daily.   lansoprazole 30 MG capsule Commonly known as: PREVACID Take 30 mg by mouth daily.   lisinopril 40 MG tablet Commonly known as: ZESTRIL Take 1 tablet (40 mg total) by mouth daily.   Melatonin 10 MG Tabs Take 10 mg by mouth at bedtime as needed (sleep).   meloxicam 15 MG tablet Commonly known as: MOBIC Take 15 mg by mouth daily.   metFORMIN 500 MG 24 hr tablet Commonly known as: GLUCOPHAGE-XR SMARTSIG:2 Tablet(s) By Mouth Every Evening   metoprolol succinate 50 MG 24 hr tablet Commonly known as: TOPROL-XL Take 100 mg by mouth daily. Take with or immediately following a meal.   OneTouch Verio test strip Generic drug: glucose blood 1 each daily.   pravastatin 10 MG tablet Commonly known as: PRAVACHOL Take 10 mg by mouth at bedtime.   tadalafil 20 MG tablet Commonly known as: CIALIS Take 1 tablet (20 mg total) by mouth daily as needed for erectile dysfunction.   testosterone cypionate 200 MG/ML injection Commonly known as: DEPOTESTOSTERONE CYPIONATE Inject 0.5 mLs (100 mg total) into the muscle every 7 (seven) days.   traZODone 50 MG tablet Commonly known as: DESYREL Take 50 mg by mouth at bedtime.   Vitamin D-3 125 MCG (5000 UT) Tabs Take 5,000 Units by mouth 2 (two) times daily.   zinc gluconate 50 MG tablet Take 50 mg by mouth daily.        Allergies:  Allergies  Allergen Reactions   Percocet [Oxycodone-Acetaminophen] Itching   Tape Rash    Dermabond skin glue and  Tape    Family History: Family History  Problem Relation Age of Onset   Hypertension Mother     Social History:  reports that he has never smoked. He has never used smokeless tobacco. He reports that he does not drink alcohol and does not use drugs.  ROS: Pertinent ROS in HPI  Physical Exam: @VS @  Constitutional:  Well nourished. Alert and oriented, No acute distress. GU: No CVA tenderness.  No bladder fullness or masses.  Patient with uncircumcised phallus. Foreskin easily retracted  Urethral meatus is patent.  No penile discharge. No penile lesions or rashes. Scrotum without lesions, cysts, rashes and/or edema.   Psychiatric: Normal mood and affect.  Laboratory Data: N/A  Pertinent Imaging: N/A   Procedure Patient's left corpus cavernosum is identified.  An area near the base of the penis is cleansed with rubbing alcohol.  Careful to avoid the dorsal vein, 0.4 of Trimix (  papaverine 30 mg, phentolamine 1 mg and prostaglandin E1 50 mcg, Lot # [id number]$  exp # 06/24/2022 is injected at a 90 degree angle into the left corpus cavernosum near the base of the penis.  Patient experienced penile fullness in 15 minutes.    Patient's right corpus cavernosum is identified.  An area near the base of the penis is cleansed with rubbing alcohol.  Careful to avoid the dorsal vein, 0.2 of Trimix (papaverine 30 mg, phentolamine 1 mg and prostaglandin E1 50 mcg, Lot # [id number]$  exp # 06/24/2022 is injected at a 90 degree angle into the right corpus cavernosum near the base of the penis.  Patient experienced penile fullness in 15 minutes.      Assessment & Plan:    1. Erectile dysfunction *** -Advised patient of the condition of priapism, painful erection lasting for more than four hours, and to contact the office or seek treatment in the ED immediately   2. Hypogonadism -repeat labs in August -Testosterone cypionate 200 mg/milliliters, 0.5 mL every 7 days  No follow-ups on file.  These  notes generated with voice recognition software. I apologize for typographical errors.  Cloretta Ned  [facility] [address]  [address] [place], [place] [address] [phone]

## 2022-06-25 ENCOUNTER — Ambulatory Visit (INDEPENDENT_AMBULATORY_CARE_PROVIDER_SITE_OTHER): Payer: BC Managed Care – PPO | Admitting: Urology

## 2022-06-25 ENCOUNTER — Encounter: Payer: Self-pay | Admitting: Urology

## 2022-06-25 VITALS — BP 154/69 | HR 71

## 2022-06-25 DIAGNOSIS — N5201 Erectile dysfunction due to arterial insufficiency: Secondary | ICD-10-CM | POA: Diagnosis not present

## 2022-06-25 DIAGNOSIS — E291 Testicular hypofunction: Secondary | ICD-10-CM

## 2022-06-25 MED ORDER — TESTOSTERONE CYPIONATE 200 MG/ML IM SOLN: 100.0000 mg | INTRAMUSCULAR | 0 refills | Status: DC

## 2022-06-25 NOTE — Patient Instructions (Signed)
TRIMIX SELF-INJECTION INSTRUCTIONS    DETAILED PROCEDURE  1. GETTING SET UP  A. Proper hygiene is important. Wash your hands and keep the penis clean.  B. Assemble the following:  - Bottle of Trimix  - Alcohol pad  - Syringe  C. Keep the Trimix cold by returning the bottle to the refrigerator, or by placing the bottle in a cup of ice.   2. PREPARE THE SYRINGE  A. Wipe the rubber top of the vial with an alcohol pad.  B. After removing the cap of the needle, pull the plunger back to the desired dosage, filling this volume with air. Use a new needle and syringe each time.  C. Insert the needle through the rubber top and inject the air into the vial.  D. Turn the vial with needle and syringe inserted upside down. Pull back on the syringe plunger in a slow and steady motion until the desired dosage is achieved.  E. Tap the side of the syringe (1cc tuberculin syringe with a 29 gauge needle) to allow any air bubbles to float towards the needle. Avoid having these air bubbles in the syringe when self-injecting by first injecting out the collected bubbles that may form.  F. Remove the needle from the bottle and replace the protective cap on the needle.    3. SELECT AND PREPARE THE SITE FOR INJECTION  A. The proper location for injection is at the 9-11 and 1-3 o'clock positions, between the base and mid-portion of the penis.(see diagram) Avoid the midline because of potential for injury to the urethra (6 o'clock; for urinary passage) and the penile arteries and nerves (near 12 o'clock). Avoid any visible veins or arteries on the surface.  B. Grasp and pull the head of the penis toward the side of your leg with the index finger and thumb (use the left hand, if right handed). While maintaining light tension, select a site for injection.  C. Clean the site with an alcohol pad.   4: INJECT TRIMIX AND APPLY COMPRESSION  A. With a steady and continuous motion, penetrate the skin with the needle at a 90 o  angle. The needle should then be advanced to the hub. Slight resistance is encountered as the needle passes into the proper position within the erectile tissue (corporeal body).  B. Inject the Trimix over approximately 4 seconds. Withdraw the needle from the penis and apply compression to the injection site for approximately 1 minute. Several minutes of compression may be required to avoid bleeding, especially if you are an aspirin user.  C. Replace the cap on the needle and dispose of properly.   If you experience a painful erection that will not go down, take four (30 mg) tablets of pseudoephedrine (Sudafed-not the extended release) and if the erection does not go down in the next hour or increases in pain, contact the office immediately or seek treatment in the ED    

## 2022-06-28 ENCOUNTER — Telehealth: Payer: Self-pay

## 2022-06-28 ENCOUNTER — Other Ambulatory Visit: Payer: Self-pay | Admitting: Urology

## 2022-06-28 DIAGNOSIS — N5201 Erectile dysfunction due to arterial insufficiency: Secondary | ICD-10-CM

## 2022-06-28 MED ORDER — AMBULATORY NON FORMULARY MEDICATION: 0 refills | Status: DC

## 2022-06-28 NOTE — Telephone Encounter (Signed)
Pt called in on triage line.  States Trimex worked well for him and he used 5 ml.  He would like RX sent to [facility] in [place].

## 2022-07-02 ENCOUNTER — Other Ambulatory Visit: Payer: BC Managed Care – PPO

## 2022-07-04 ENCOUNTER — Ambulatory Visit: Payer: BC Managed Care – PPO | Admitting: Urology

## 2022-07-05 ENCOUNTER — Ambulatory Visit: Payer: BC Managed Care – PPO | Admitting: Urology

## 2022-09-03 ENCOUNTER — Encounter: Payer: Self-pay | Admitting: Urology

## 2022-09-07 ENCOUNTER — Other Ambulatory Visit: Payer: Self-pay

## 2022-09-07 DIAGNOSIS — E291 Testicular hypofunction: Secondary | ICD-10-CM

## 2022-09-07 DIAGNOSIS — Z113 Encounter for screening for infections with a predominantly sexual mode of transmission: Secondary | ICD-10-CM

## 2022-09-10 ENCOUNTER — Other Ambulatory Visit: Payer: BC Managed Care – PPO

## 2022-09-10 DIAGNOSIS — E291 Testicular hypofunction: Secondary | ICD-10-CM

## 2022-09-10 DIAGNOSIS — Z113 Encounter for screening for infections with a predominantly sexual mode of transmission: Secondary | ICD-10-CM

## 2022-09-11 LAB — TESTOSTERONE: Testosterone: 1337 ng/dL — ABNORMAL HIGH (ref 264–916)

## 2022-09-11 LAB — HEMOGLOBIN AND HEMATOCRIT, BLOOD
Hematocrit: 49.9 % (ref 37.5–51.0)
Hemoglobin: 16.9 g/dL (ref 13.0–17.7)

## 2022-09-11 LAB — HSV 1 AND 2 AB, IGG
HSV 1 Glycoprotein G Ab, IgG: 32.6 {index} — ABNORMAL HIGH (ref 0.00–0.90)
HSV 2 IgG, Type Spec: <0.91 {index} (ref 0.00–0.90)

## 2022-09-14 ENCOUNTER — Other Ambulatory Visit: Payer: Self-pay | Admitting: Urology

## 2022-09-14 DIAGNOSIS — E291 Testicular hypofunction: Secondary | ICD-10-CM

## 2022-09-14 DIAGNOSIS — B009 Herpesviral infection, unspecified: Secondary | ICD-10-CM

## 2022-09-14 MED ORDER — TADALAFIL 20 MG PO TABS: 20.0000 mg | ORAL_TABLET | Freq: Every day | ORAL | 3 refills | Status: DC | PRN

## 2022-09-14 MED ORDER — VALACYCLOVIR HCL 500 MG PO TABS: 500.0000 mg | ORAL_TABLET | Freq: Every day | ORAL | 0 refills | Status: AC

## 2022-10-23 ENCOUNTER — Ambulatory Visit: Payer: BC Managed Care – PPO | Admitting: Dermatology

## 2022-10-23 ENCOUNTER — Encounter: Payer: Self-pay | Admitting: Dermatology

## 2022-10-23 DIAGNOSIS — D044 Carcinoma in situ of skin of scalp and neck: Secondary | ICD-10-CM | POA: Diagnosis not present

## 2022-10-23 DIAGNOSIS — L814 Other melanin hyperpigmentation: Secondary | ICD-10-CM

## 2022-10-23 DIAGNOSIS — Z85820 Personal history of malignant melanoma of skin: Secondary | ICD-10-CM

## 2022-10-23 DIAGNOSIS — B001 Herpesviral vesicular dermatitis: Secondary | ICD-10-CM | POA: Diagnosis not present

## 2022-10-23 DIAGNOSIS — Z1283 Encounter for screening for malignant neoplasm of skin: Secondary | ICD-10-CM

## 2022-10-23 DIAGNOSIS — W908XXA Exposure to other nonionizing radiation, initial encounter: Secondary | ICD-10-CM

## 2022-10-23 DIAGNOSIS — L739 Follicular disorder, unspecified: Secondary | ICD-10-CM

## 2022-10-23 DIAGNOSIS — Z86018 Personal history of other benign neoplasm: Secondary | ICD-10-CM

## 2022-10-23 DIAGNOSIS — C4492 Squamous cell carcinoma of skin, unspecified: Secondary | ICD-10-CM

## 2022-10-23 DIAGNOSIS — D225 Melanocytic nevi of trunk: Secondary | ICD-10-CM

## 2022-10-23 DIAGNOSIS — Z85828 Personal history of other malignant neoplasm of skin: Secondary | ICD-10-CM

## 2022-10-23 DIAGNOSIS — L578 Other skin changes due to chronic exposure to nonionizing radiation: Secondary | ICD-10-CM

## 2022-10-23 DIAGNOSIS — L821 Other seborrheic keratosis: Secondary | ICD-10-CM

## 2022-10-23 DIAGNOSIS — D229 Melanocytic nevi, unspecified: Secondary | ICD-10-CM

## 2022-10-23 DIAGNOSIS — D485 Neoplasm of uncertain behavior of skin: Secondary | ICD-10-CM

## 2022-10-23 DIAGNOSIS — B009 Herpesviral infection, unspecified: Secondary | ICD-10-CM

## 2022-10-23 DIAGNOSIS — D492 Neoplasm of unspecified behavior of bone, soft tissue, and skin: Secondary | ICD-10-CM | POA: Diagnosis not present

## 2022-10-23 HISTORY — DX: Squamous cell carcinoma of skin, unspecified: C44.92

## 2022-10-23 MED ORDER — CLINDAMYCIN PHOSPHATE 1 % EX SOLN: CUTANEOUS | 5 refills | Status: DC

## 2022-10-23 NOTE — Patient Instructions (Addendum)
Start clindamycin solution 1-2 times daily to affected areas ar scalp as needed. Recommend OTC Head & Shoulders shampoo 2-3x per week, massage into scalp and let sit 3-5 minutes before rinsing.  Wound Care Instructions  Cleanse wound gently with soap and water once a day then pat dry with clean gauze. Apply a thin coat of Petrolatum (petroleum jelly, "Vaseline") over the wound (unless you have an allergy to this). We recommend that you use a new, sterile tube of Vaseline. Do not pick or remove scabs. Do not remove the yellow or white "healing tissue" from the base of the wound.  Cover the wound with fresh, clean, nonstick gauze and secure with paper tape. You may use Band-Aids in place of gauze and tape if the wound is small enough, but would recommend trimming much of the tape off as there is often too much. Sometimes Band-Aids can irritate the skin.  You should call the office for your biopsy report after 1 week if you have not already been contacted.  If you experience any problems, such as abnormal amounts of bleeding, swelling, significant bruising, significant pain, or evidence of infection, please call the office immediately.  FOR ADULT SURGERY PATIENTS: If you need something for pain relief you may take 1 extra strength Tylenol (acetaminophen) AND 2 Ibuprofen (200mg  each) together every 4 hours as needed for pain. (do not take these if you are allergic to them or if you have a reason you should not take them.) Typically, you may only need pain medication for 1 to 3 days.   Melanoma ABCDEs  Melanoma is the most dangerous type of skin cancer, and is the leading cause of death from skin disease.  You are more likely to develop melanoma if you: Have light-colored skin, light-colored eyes, or red or blond hair Spend a lot of time in the sun Tan regularly, either outdoors or in a tanning bed Have had blistering sunburns, especially during childhood Have a close family member who has had a  melanoma Have atypical moles or large birthmarks  Early detection of melanoma is key since treatment is typically straightforward and cure rates are extremely high if we catch it early.   The first sign of melanoma is often a change in a mole or a new dark spot.  The ABCDE system is a way of remembering the signs of melanoma.  A for asymmetry:  The two halves do not match. B for border:  The edges of the growth are irregular. C for color:  A mixture of colors are present instead of an even brown color. D for diameter:  Melanomas are usually (but not always) greater than 6mm - the size of a pencil eraser. E for evolution:  The spot keeps changing in size, shape, and color.  Please check your skin once per month between visits. You can use a small mirror in front and a large mirror behind you to keep an eye on the back side or your body.   If you see any new or changing lesions before your next follow-up, please call to schedule a visit.  Please continue daily skin protection including broad spectrum sunscreen SPF 30+ to sun-exposed areas, reapplying every 2 hours as needed when you're outdoors.    Due to recent changes in healthcare laws, you may see results of your pathology and/or laboratory studies on MyChart before the doctors have had a chance to review them. We understand that in some cases there may be results that  are confusing or concerning to you. Please understand that not all results are received at the same time and often the doctors may need to interpret multiple results in order to provide you with the best plan of care or course of treatment. Therefore, we ask that you please give [place] 2 business days to thoroughly review all your results before contacting the office for clarification. Should we see a critical lab result, you will be contacted sooner.   If You Need Anything After Your Visit  If you have any questions or concerns for your doctor, please call our main line at  [phone] and press option 4 to reach your doctor's medical assistant. If no one answers, please leave a voicemail as directed and we will return your call as soon as possible. Messages left after 4 pm will be answered the following business day.   You may also send [place] a message via MyChart. We typically respond to MyChart messages within 1-2 business days.  For prescription refills, please ask your pharmacy to contact our office. Our fax number is [phone].  If you have an urgent issue when the clinic is closed that cannot wait until the next business day, you can page your doctor at the number below.    Please note that while we do our best to be available for urgent issues outside of office hours, we are not available 24/7.   If you have an urgent issue and are unable to reach [place], you may choose to seek medical care at your doctor's office, retail clinic, urgent care center, or emergency room.  If you have a medical emergency, please immediately call 911 or go to the emergency department.  Pager Numbers  - Dr. Gwen Pounds: [phone]  - Dr. Roseanne Reno: [phone]  - Dr. Katrinka Blazing: [phone]   In the event of inclement weather, please call our main line at [phone] for an update on the status of any delays or closures.  Dermatology Medication Tips: Please keep the boxes that topical medications come in in order to help keep track of the instructions about where and how to use these. Pharmacies typically print the medication instructions only on the boxes and not directly on the medication tubes.   If your medication is too expensive, please contact our office at [phone] option 4 or send [place] a message through MyChart.   We are unable to tell what your co-pay for medications will be in advance as this is different depending on your insurance coverage. However, we may be able to find a substitute medication at lower cost or fill out paperwork to get insurance to cover a needed  medication.   If a prior authorization is required to get your medication covered by your insurance company, please allow [place] 1-2 business days to complete this process.  Drug prices often vary depending on where the prescription is filled and some pharmacies may offer cheaper prices.  The website www.goodrx.com contains coupons for medications through different pharmacies. The prices here do not account for what the cost may be with help from insurance (it may be cheaper with your insurance), but the website can give you the price if you did not use any insurance.  - You can print the associated coupon and take it with your prescription to the pharmacy.  - You may also stop by our office during regular business hours and pick up a GoodRx coupon card.  - If you need your prescription sent electronically to a different pharmacy,  notify our office through [facility] or by phone at [phone] option 4.     Si Usted Necesita Algo Despus de Su Visita  Tambin puede enviarnos un mensaje a travs de Clinical cytogeneticist. Por lo general respondemos a los mensajes de MyChart en el transcurso de 1 a 2 das hbiles.  Para renovar recetas, por favor pida a su farmacia que se ponga en contacto con nuestra oficina. Annie Sable de fax es [place] [phone].  Si tiene un asunto urgente cuando la clnica est cerrada y que no puede esperar hasta el siguiente da hbil, puede llamar/localizar a su doctor(a) al nmero que aparece a continuacin.   Por favor, tenga en cuenta que aunque hacemos todo lo posible para estar disponibles para asuntos urgentes fuera del horario de [place], no estamos disponibles las 24 horas del da, los 7 [address]  [address] de la [place].   Si tiene un problema urgente y no puede comunicarse con nosotros, puede optar por buscar atencin mdica  en el consultorio de su doctor(a), en una clnica privada, en un centro de atencin urgente o en una sala de emergencias.  Si tiene Engineer, drilling,  por favor llame inmediatamente al 911 o vaya a la sala de emergencias.  Nmeros de bper  - Dr. Gwen Pounds: [phone]  - Dra. Roseanne Reno: [phone]  - Dr. Katrinka Blazing: [phone]   En caso de inclemencias del tiempo, por favor llame a Lacy Duverney principal al [phone] para una actualizacin sobre el [place] de cualquier retraso o cierre.  Consejos para la medicacin en dermatologa: Por favor, guarde las cajas en las que vienen los medicamentos de uso tpico para ayudarle a seguir las instrucciones sobre dnde y cmo usarlos. Las farmacias generalmente imprimen las instrucciones del medicamento slo en las cajas y no directamente en los tubos del [place].   Si su medicamento es muy caro, por favor, pngase en contacto con Rolm Gala llamando al [phone] y presione la opcin 4 o envenos un mensaje a travs de Clinical cytogeneticist.   No podemos decirle cul ser su copago por los medicamentos por adelantado ya que esto es diferente dependiendo de la cobertura de su seguro. Sin embargo, es posible que podamos encontrar un medicamento sustituto a Audiological scientist un formulario para que el seguro cubra el medicamento que se considera necesario.   Si se requiere una autorizacin previa para que su compaa de seguros [place] su medicamento, por favor permtanos de 1 a 2 das hbiles para completar [address]  Los precios de los medicamentos varan con frecuencia dependiendo del Environmental consultant de dnde se surte la receta y alguna farmacias pueden ofrecer precios ms baratos.  El sitio web www.goodrx.com tiene cupones para medicamentos de Health and safety inspector. Los precios aqu no tienen en cuenta lo que podra costar con la ayuda del seguro (puede ser ms barato con su seguro), pero el sitio web puede darle el precio si no utiliz Tourist information centre manager.  - Puede imprimir el cupn correspondiente y llevarlo con su receta a la farmacia.  - Tambin puede pasar por nuestra oficina durante el horario de atencin  regular y Education officer, museum una tarjeta de cupones de GoodRx.  - Si necesita que su receta se enve electrnicamente a una farmacia diferente, informe a nuestra oficina a travs de MyChart de [place] o por telfono llamando al [phone] y presione la opcin 4.

## 2022-10-23 NOTE — Progress Notes (Signed)
Follow-Up Visit   Subjective  Mike Patel is a 62 y.o. male who presents for the following: Skin Cancer Screening and Full Body Skin Exam  The patient presents for Total-Body Skin Exam (TBSE) for skin cancer screening and mole check. The patient has spots, moles and lesions to be evaluated, some may be new or changing and the patient may have concern these could be cancer.  Patient with hx of MM, BCC and Dyspalstic Nevi.   The following portions of the chart were reviewed this encounter and updated as appropriate: medications, allergies, medical history  Review of Systems:  No other skin or systemic complaints except as noted in HPI or Assessment and Plan.  Objective  Well appearing patient in no apparent distress; mood and affect are within normal limits.  A full examination was performed including scalp, head, eyes, ears, nose, lips, neck, chest, axillae, abdomen, back, buttocks, bilateral upper extremities, bilateral lower extremities, hands, feet, fingers, toes, fingernails, and toenails. All findings within normal limits unless otherwise noted below.   Relevant physical exam findings are noted in the Assessment and Plan.  right neck 4.0 x 3.16mm waxy gray brown macule with darker edge  Lips Clear today    Assessment & Plan   SKIN CANCER SCREENING PERFORMED TODAY.  ACTINIC DAMAGE - Chronic condition, secondary to cumulative UV/sun exposure - diffuse scaly erythematous macules with underlying dyspigmentation - Recommend daily broad spectrum sunscreen SPF 30+ to sun-exposed areas, reapply every 2 hours as needed.  - Staying in the shade or wearing long sleeves, sun glasses (UVA+UVB protection) and wide brim hats (4-inch brim around the entire circumference of the hat) are also recommended for sun protection.  - Call for new or changing lesions.  LENTIGINES, SEBORRHEIC KERATOSES, HEMANGIOMAS - Benign normal skin lesions - Benign-appearing - Call for any  changes   MELANOCYTIC NEVI - Tan-brown and/or pink-flesh-colored symmetric macules and papules - L spinal upper back   4.73mm brown macule, lighter central - R paraspinal mid upper back   3.9mm med brown two toned macule - R spinal mid back   5.97mm brown macule with darker edge - Benign appearing on exam today, Stable - Observation - Call clinic for new or changing moles - Recommend daily use of broad spectrum spf 30+ sunscreen to sun-exposed areas.    History of Basal Cell Carcinoma of the Skin - No evidence of recurrence today- R lower lip - Recommend regular full body skin exams - Recommend daily broad spectrum sunscreen SPF 30+ to sun-exposed areas, reapply every 2 hours as needed.  - Call if any new or changing lesions are noted between office visits     History of Dysplastic Nevi - No evidence of recurrence today- L mid back, R spinal mid back, R upper arm - Recommend regular full body skin exams - Recommend daily broad spectrum sunscreen SPF 30+ to sun-exposed areas, reapply every 2 hours as needed.  - Call if any new or changing lesions are noted between office visits     HISTORY OF MELANOMA B[place]lows 0.78mm, Level II, [place] 1A 06/29/19 - No evidence of recurrence today at spinal mid back - No lymphadenopathy - Recommend regular full body skin exams - Recommend daily broad spectrum sunscreen SPF 30+ to sun-exposed areas, reapply every 2 hours as needed.  - Call if any new or changing lesions are noted between office visits     Neoplasm of uncertain behavior of skin right neck  Epidermal / dermal shaving  Lesion diameter (cm):  0.6 Informed consent: discussed and consent obtained   Patient was prepped and draped in usual sterile fashion: area prepped with alcohol. 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  It is infectious and can be spread from person to person by direct contact.  It is not curable, but is treatable with topical and oral medication.   Patient tested positive for HSV-1 Ab (neg Ab for HSV-2) continue valacyclovir as needed for flares.   Folliculitis Exam: Healing excoriated papules at crown scalp  Chronic and persistent condition with duration or expected duration over one year. Condition is symptomatic/ bothersome to patient. Not currently at goal.    Folliculitis occurs due to inflammation of the superficial hair follicle (pore), resulting in acne-like lesions (pus bumps). It can be infectious (bacterial, fungal) or noninfectious (shaving, tight clothing, heat/sweat, medications).  Folliculitis can be acute or chronic and recommended treatment depends on the underlying cause of folliculitis.   Treatment Plan:  Start clindamycin solution 1-2 times daily to affected areas ar scalp as needed. Recommend OTC Head & Shoulders shampoo 2-3x per week, massage into scalp and let sit 3-5 minutes before rinsing.     Return in about 6 months (around 04/23/2023) for TBSE, with Dr. Roseanne Reno, Hx BCC, Hx Dysplastic Nevi, Hx MM.  Anise Salvo, RMA, am acting as scribe for Willeen Niece, MD .   Documentation: I have reviewed the above documentation  for accuracy and completeness, and I agree with the above.  Willeen Niece, MD

## 2022-10-25 LAB — SURGICAL PATHOLOGY

## 2022-10-29 ENCOUNTER — Telehealth: Payer: Self-pay

## 2022-10-29 NOTE — Telephone Encounter (Signed)
-----   Message from Willeen Niece sent at 10/29/2022  8:32 AM EDT ----- 1. 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

## 2022-10-29 NOTE — Telephone Encounter (Signed)
Advised pt of pathology and scheduled pt for EDC./sh

## 2022-11-13 ENCOUNTER — Ambulatory Visit (INDEPENDENT_AMBULATORY_CARE_PROVIDER_SITE_OTHER): Payer: BC Managed Care – PPO | Admitting: Dermatology

## 2022-11-13 ENCOUNTER — Encounter: Payer: Self-pay | Admitting: Dermatology

## 2022-11-13 VITALS — BP 149/81 | HR 72

## 2022-11-13 DIAGNOSIS — D099 Carcinoma in situ, unspecified: Secondary | ICD-10-CM

## 2022-11-13 DIAGNOSIS — Z85820 Personal history of malignant melanoma of skin: Secondary | ICD-10-CM | POA: Diagnosis not present

## 2022-11-13 DIAGNOSIS — W908XXA Exposure to other nonionizing radiation, initial encounter: Secondary | ICD-10-CM | POA: Diagnosis not present

## 2022-11-13 DIAGNOSIS — L578 Other skin changes due to chronic exposure to nonionizing radiation: Secondary | ICD-10-CM | POA: Diagnosis not present

## 2022-11-13 DIAGNOSIS — D044 Carcinoma in situ of skin of scalp and neck: Secondary | ICD-10-CM | POA: Diagnosis not present

## 2022-11-13 NOTE — Progress Notes (Signed)
   Follow-Up Visit   Subjective  Mike Patel is a 62 y.o. 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 Patient here for [facility].  The patient has spots, moles and lesions to be evaluated, some may be new or changing and the patient may have concern these could be cancer.   The following portions of the chart were reviewed this encounter and updated as appropriate: medications, allergies, medical history  Review of Systems:  No other skin or systemic complaints except as noted in HPI or Assessment and Plan.  Objective  Well appearing patient in no apparent distress; mood and affect are within normal limits.  A focused examination was performed of the following areas: Face, neck  Relevant physical exam findings are noted in the Assessment and Plan.  Right Neck Pink biopsy site.    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  Destruction of lesion  Destruction method: electrodesiccation and curettage   Informed consent: discussed and consent obtained   Timeout:  patient name, date of birth, surgical site, and procedure verified Anesthesia: the lesion was anesthetized in a standard fashion   Anesthetic:  1% lidocaine w/ epinephrine 1-100,000 local infiltration Curettage performed in three different directions: Yes   Electrodesiccation performed over the curetted area: Yes   Final wound size (cm):  0.7 Hemostasis achieved with:  pressure, aluminum chloride and electrodesiccation Outcome: patient tolerated procedure well with no complications   Post-procedure details: wound care instructions given   Post-procedure details comment:  Ointment and bandage applied.  Biopsy proven, SCC in situ arising in a Seborrheic Keratosis.  ACTINIC DAMAGE - chronic, secondary to cumulative UV radiation exposure/sun exposure over time - diffuse scaly erythematous macules with underlying dyspigmentation - Recommend daily  broad spectrum sunscreen SPF 30+ to sun-exposed areas, reapply every 2 hours as needed.  - Recommend staying in the shade or wearing long sleeves, sun glasses (UVA+UVB protection) and wide brim hats (4-inch brim around the entire circumference of the hat). - Call for new or changing lesions.  HISTORY OF MELANOMA - No evidence of recurrence today - Recommend regular full body skin exams - Recommend daily broad spectrum sunscreen SPF 30+ to sun-exposed areas, reapply every 2 hours as needed.  - Call if any new or changing lesions are noted between office visits   Return as scheduled, for TBSE.  ICherlyn Labella, CMA, am acting as scribe for Willeen Niece, MD .   Documentation: I have reviewed the above documentation for accuracy and completeness, and I agree with the above.  Willeen Niece, MD

## 2022-11-13 NOTE — Patient Instructions (Addendum)

## 2022-11-27 ENCOUNTER — Other Ambulatory Visit: Payer: Self-pay | Admitting: *Deleted

## 2022-11-27 DIAGNOSIS — E291 Testicular hypofunction: Secondary | ICD-10-CM

## 2022-11-28 MED ORDER — TESTOSTERONE CYPIONATE 200 MG/ML IM SOLN: 100.0000 mg | INTRAMUSCULAR | 0 refills | Status: DC

## 2023-03-13 ENCOUNTER — Other Ambulatory Visit: Payer: BC Managed Care – PPO

## 2023-03-13 DIAGNOSIS — N400 Enlarged prostate without lower urinary tract symptoms: Secondary | ICD-10-CM

## 2023-03-13 DIAGNOSIS — E291 Testicular hypofunction: Secondary | ICD-10-CM

## 2023-03-14 LAB — TESTOSTERONE: Testosterone: 759 ng/dL (ref 264–916)

## 2023-03-14 LAB — PSA: Prostate Specific Ag, Serum: 1.9 ng/mL (ref 0.0–4.0)

## 2023-03-14 LAB — HEMATOCRIT: Hematocrit: 48.5 % (ref 37.5–51.0)

## 2023-03-15 ENCOUNTER — Encounter: Payer: Self-pay | Admitting: Urology

## 2023-03-15 ENCOUNTER — Ambulatory Visit: Payer: BC Managed Care – PPO | Admitting: Urology

## 2023-03-15 VITALS — BP 164/85 | HR 83 | Ht 70.0 in | Wt 280.0 lb

## 2023-03-15 DIAGNOSIS — Z125 Encounter for screening for malignant neoplasm of prostate: Secondary | ICD-10-CM

## 2023-03-15 DIAGNOSIS — E291 Testicular hypofunction: Secondary | ICD-10-CM | POA: Diagnosis not present

## 2023-03-15 DIAGNOSIS — N5201 Erectile dysfunction due to arterial insufficiency: Secondary | ICD-10-CM

## 2023-03-15 MED ORDER — TADALAFIL 20 MG PO TABS: 20.0000 mg | ORAL_TABLET | Freq: Every day | ORAL | 0 refills | Status: DC | PRN

## 2023-03-15 NOTE — Progress Notes (Signed)
I, Maysun Anabel Bene, acting as a scribe for Riki Altes, MD., have documented all relevant documentation on the behalf of Riki Altes, MD, as directed by Riki Altes, MD while in the presence of Riki Altes, MD.  03/15/2023 9:21 AM   Mike Patel 1960/08/01 [id number]  Referring provider: Jerl Mina, MD [address] [facility] [place],  [place] [address]  Chief Complaint  Patient presents with   Hypogonadism   Urologic history: 1.  BPH TURP in October 2015   2.  Hypogonadism TRT testosterone cypionate   3.  Erectile dysfunction Sildenafil 50-100 mg   4..  History hematospermia  HPI: Mike Patel is a 63 y.o. male presents for annual follow-up.  At last year's visit, he was interested in pursuing intracavernosal injections. He did start, however although it was effective, he had one episode where he had bruising of the penis and elected not to pursue any further injections.  He states tadalafil 20mg  is presently working satisfactorily.  Remains on testosterone with good energy level and libido. Labs 03/13/23 PSA 1.9, hematocrit 48.5, testosterone 759.  PSA trend   Prostate Specific Ag, Serum  Latest Ref Rng 0.0 - 4.0 ng/mL  03/12/2022 1.9   03/13/2023 1.9      PMH: Past Medical History:  Diagnosis Date   Actinic keratosis    Aortic atherosclerosis (HCC)    Arthritis    BACK   Basal cell carcinoma 05/19/2019   Right lower lip. Nodular and infiltrative.   DDD (degenerative disc disease), cervical    Diabetes mellitus without complication (HCC)    Dysplastic nevus 07/14/2019   Left mid back. Moderate atypia, limited margins free.   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 Moderate atypia, deep margin involved.   Dysplastic nevus 07/14/2019   Right upper arm. Moderate atypia, deep margin involved.    GERD (gastroesophageal reflux disease)    Gout    Hypertension    Melanemia    Melanoma (HCC) 06/29/2019   0.40mm Level  II,  spinal mid back WLE, [place]   Nausea vomiting and diarrhea 03/20/2022   Obesity    Pre-diabetes    Sleep apnea    cpap   Squamous cell carcinoma of skin 10/23/2022   SCC IS, R neck, EDC 11/13/22    Surgical History: Past Surgical History:  Procedure Laterality Date   BACK SURGERY  2018   x3   CHOLECYSTECTOMY N/A 12/02/2017   Procedure: LAPAROSCOPIC CHOLECYSTECTOMY WITH INTRAOPERATIVE CHOLANGIOGRAM;  Surgeon: Henrene Dodge, MD;  Location: [facility] ORS;  Service: General;  Laterality: N/A;   COLONOSCOPY N/A 05/24/2014   Procedure: COLONOSCOPY;  Surgeon: Wallace Cullens, MD;  Location: [facility] ENDOSCOPY;  Service: Gastroenterology;  Laterality: N/A;   COLONOSCOPY WITH PROPOFOL N/A 05/30/2020   Procedure: COLONOSCOPY WITH PROPOFOL;  Surgeon: Toledo, [name], MD;  Location: [facility] ENDOSCOPY;  Service: Gastroenterology;  Laterality: N/A;   ELBOW SURGERY Right    tennis elbow   ERCP N/A 11/18/2017   Procedure: ENDOSCOPIC RETROGRADE CHOLANGIOPANCREATOGRAPHY (ERCP);  Surgeon: Midge Minium, MD;  Location[facility] ENDOSCOPY;  Service: Endoscopy;  Laterality: N/A;   ESOPHAGOGASTRODUODENOSCOPY N/A 05/24/2014   Procedure: ESOPHAGOGASTRODUODENOSCOPY (EGD);  Surgeon: Wallace Cullens, MD;  Location: [facility] ENDOSCOPY;  Service: Gastroenterology;  Laterality: N/A;   PROSTATE SURGERY  2015   SPINE SURGERY     l-spine x 2   TOTAL HIP ARTHROPLASTY Right 12/19/2020   Procedure: TOTAL HIP ARTHROPLASTY ANTERIOR  APPROACH;  Surgeon: Lyndle Herrlich, MD;  Location: [facility] ORS;  Service: Orthopedics;  Laterality: Right;    Home Medications:  Allergies as of 03/15/2023       Reactions   Percocet [oxycodone-acetaminophen] Itching   Tape Rash   Dermabond skin glue and Tape        Medication List        Accurate as of March 15, 2023  9:21 AM. If you have any questions, ask your nurse or doctor.          STOP taking these medications    AMBULATORY NON FORMULARY MEDICATION Stopped by: Riki Altes    GABAPENTIN PO Stopped by: Riki Altes       TAKE these medications    amLODipine 10 MG tablet Commonly known as: NORVASC Take 10 mg by mouth daily.   aspirin EC 81 MG tablet Take by mouth.   clindamycin 1 % external solution Commonly known as: CLEOCIN T Apply 1-2 times daily to affected areas at scalp as needed.   cyanocobalamin 1000 MCG/ML injection Commonly known as: VITAMIN B12 Inject 1,000 mcg into the muscle every 30 (thirty) days.   Fish Oil 1000 MG Caps Take 2,000 mg by mouth daily.   fluticasone 50 MCG/ACT nasal spray Commonly known as: FLONASE Place 2 sprays into both nostrils in the morning and at bedtime.   hydrochlorothiazide 12.5 MG tablet Commonly known as: HYDRODIURIL Take 12.5 mg by mouth daily.   lansoprazole 30 MG capsule Commonly known as: PREVACID Take 30 mg by mouth daily.   lisinopril 40 MG tablet Commonly known as: ZESTRIL Take 1 tablet (40 mg total) by mouth daily.   Melatonin 10 MG Tabs Take 10 mg by mouth at bedtime as needed (sleep).   meloxicam 15 MG tablet Commonly known as: MOBIC Take 15 mg by mouth daily.   metFORMIN 500 MG 24 hr tablet Commonly known as: GLUCOPHAGE-XR SMARTSIG:2 Tablet(s) By Mouth Every Evening   metoprolol succinate 50 MG 24 hr tablet Commonly known as: TOPROL-XL Take 100 mg by mouth daily. Take with or immediately following a meal.   OneTouch Verio test strip Generic drug: glucose blood 1 each daily.   pravastatin 10 MG tablet Commonly known as: PRAVACHOL Take 10 mg by mouth at bedtime.   tadalafil 20 MG tablet Commonly known as: CIALIS Take 1 tablet (20 mg total) by mouth daily as needed for erectile dysfunction.   tadalafil 20 MG tablet Commonly known as: CIALIS Take 1 tablet (20 mg total) by mouth daily as needed for erectile dysfunction.   testosterone cypionate 200 MG/ML injection Commonly known as: DEPOTESTOSTERONE CYPIONATE Inject 0.5 mLs (100 mg total) into the muscle every 7  (seven) days.   traZODone 50 MG tablet Commonly known as: DESYREL Take 50 mg by mouth at bedtime.   Vitamin D-3 125 MCG (5000 UT) Tabs Take 5,000 Units by mouth 2 (two) times daily.   zinc gluconate 50 MG tablet Take 50 mg by mouth daily.        Allergies:  Allergies  Allergen Reactions   Percocet [Oxycodone-Acetaminophen] Itching   Tape Rash    Dermabond skin glue and Tape    Family History: Family History  Problem Relation Age of Onset   Hypertension Mother     Social History:  reports that he has never smoked. He has never used smokeless tobacco. He reports that he does not drink alcohol and does not use drugs.   Physical Exam: BP Marland Kitchen)  164/85   Pulse 83   Ht 5\' 10"  (1.778 m)   Wt 280 lb (127 kg)   BMI 40.18 kg/m   Constitutional:  Alert and oriented, No acute distress. HEENT: [place] AT GU: Declined DRE  Psychiatric: Normal mood and affect.  Assessment & Plan:    1. Erectile dysfunction Stable and tadalafil- refill sent to pharmacy.  2. Hypogonadism Lab visit 6 months testosterone, hematocrit Office visit 1 year testosterone, PSA, hematocrit  I have reviewed the above documentation for accuracy and completeness, and I agree with the above.   Riki Altes, MD  [facility] [address], [address] [place], [place] [address] [phone]

## 2023-04-15 ENCOUNTER — Other Ambulatory Visit: Payer: Self-pay | Admitting: Urology

## 2023-04-15 DIAGNOSIS — E291 Testicular hypofunction: Secondary | ICD-10-CM

## 2023-04-15 MED ORDER — TESTOSTERONE CYPIONATE 200 MG/ML IM SOLN: 100.0000 mg | INTRAMUSCULAR | 0 refills | Status: DC

## 2023-05-06 ENCOUNTER — Ambulatory Visit: Payer: BC Managed Care – PPO | Admitting: Dermatology

## 2023-05-06 DIAGNOSIS — L820 Inflamed seborrheic keratosis: Secondary | ICD-10-CM

## 2023-05-06 DIAGNOSIS — Z85828 Personal history of other malignant neoplasm of skin: Secondary | ICD-10-CM

## 2023-05-06 DIAGNOSIS — L739 Follicular disorder, unspecified: Secondary | ICD-10-CM

## 2023-05-06 DIAGNOSIS — D1801 Hemangioma of skin and subcutaneous tissue: Secondary | ICD-10-CM

## 2023-05-06 DIAGNOSIS — L814 Other melanin hyperpigmentation: Secondary | ICD-10-CM | POA: Diagnosis not present

## 2023-05-06 DIAGNOSIS — L578 Other skin changes due to chronic exposure to nonionizing radiation: Secondary | ICD-10-CM | POA: Diagnosis not present

## 2023-05-06 DIAGNOSIS — Z1283 Encounter for screening for malignant neoplasm of skin: Secondary | ICD-10-CM

## 2023-05-06 DIAGNOSIS — W908XXA Exposure to other nonionizing radiation, initial encounter: Secondary | ICD-10-CM

## 2023-05-06 DIAGNOSIS — D225 Melanocytic nevi of trunk: Secondary | ICD-10-CM

## 2023-05-06 DIAGNOSIS — Z86007 Personal history of in-situ neoplasm of skin: Secondary | ICD-10-CM

## 2023-05-06 DIAGNOSIS — L821 Other seborrheic keratosis: Secondary | ICD-10-CM

## 2023-05-06 DIAGNOSIS — L570 Actinic keratosis: Secondary | ICD-10-CM | POA: Diagnosis not present

## 2023-05-06 DIAGNOSIS — D229 Melanocytic nevi, unspecified: Secondary | ICD-10-CM

## 2023-05-06 DIAGNOSIS — Z85820 Personal history of malignant melanoma of skin: Secondary | ICD-10-CM

## 2023-05-06 DIAGNOSIS — Z86018 Personal history of other benign neoplasm: Secondary | ICD-10-CM

## 2023-05-06 NOTE — Patient Instructions (Addendum)

## 2023-05-06 NOTE — Progress Notes (Signed)
 Follow-Up Visit   Subjective  Mike Patel is a 63 y.o. male who presents for the following: Skin Cancer Screening and Full Body Skin Exam  The patient presents for Total-Body Skin Exam (TBSE) for skin cancer screening and mole check. The patient has spots, moles and lesions to be evaluated, some may be new or changing. He has a new spot on his forehead at hairline. History of BCC, Dysplastic Nevi, and Melanoma.    The following portions of the chart were reviewed this encounter and updated as appropriate: medications, allergies, medical history  Review of Systems:  No other skin or systemic complaints except as noted in HPI or Assessment and Plan.  Objective  Well appearing patient in no apparent distress; mood and affect are within normal limits.  A full examination was performed including scalp, head, eyes, ears, nose, lips, neck, chest, axillae, abdomen, back, buttocks, bilateral upper extremities, bilateral lower extremities, hands, feet, fingers, toes, fingernails, and toenails. All findings within normal limits unless otherwise noted below.   Relevant physical exam findings are noted in the Assessment and Plan.  L forearm x 1, R knee x 1, L hand dorsum x 1, frontal scalp x 5, L preauricular x 1, R hand dorsum x 1 (10) Pink keratotic papules.  Assessment & Plan   SKIN CANCER SCREENING PERFORMED TODAY.  ACTINIC DAMAGE - Chronic condition, secondary to cumulative UV/sun exposure - diffuse scaly erythematous macules with underlying dyspigmentation - Recommend daily broad spectrum sunscreen SPF 30+ to sun-exposed areas, reapply every 2 hours as needed.  - Staying in the shade or wearing long sleeves, sun glasses (UVA+UVB protection) and wide brim hats (4-inch brim around the entire circumference of the hat) are also recommended for sun protection.  - Call for new or changing lesions.  LENTIGINES, SEBORRHEIC KERATOSES, HEMANGIOMAS - Benign normal skin lesions -  Benign-appearing - Call for any changes  MELANOCYTIC NEVI - Tan-brown and/or pink-flesh-colored symmetric macules and papules - L spinal upper back 4.110mm brown macule, lighter central - R paraspinal mid upper back 3.36mm med brown two toned macule - R spinal mid back 5.23mm brown macule with darker edge - Benign appearing on exam today - Observation - Call clinic for new or changing moles - Recommend daily use of broad spectrum spf 30+ sunscreen to sun-exposed areas.   INFLAMED SEBORRHEIC KERATOSIS - 10 mm waxy pink tan macule L upper forearm - Benign-appearing. Recheck on follow-up. - Discussed benign etiology and prognosis. - Observe - Call for any changes      History of Basal Cell Carcinoma of the Skin - No evidence of recurrence today- R lower lip, 2021 - Recommend regular full body skin exams - Recommend daily broad spectrum sunscreen SPF 30+ to sun-exposed areas, reapply every 2 hours as needed.  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  - Call if any new or changing lesions are noted between office visits  History of Dysplastic Nevi - No evidence of recurrence today- L mid back, R spinal mid back, R upper arm - Recommend regular full body skin exams - Recommend daily broad spectrum sunscreen SPF 30+ to sun-exposed areas, reapply every 2 hours as needed.  -  Call if any new or changing lesions are noted between office visits    HISTORY OF MELANOMA  Spinal Mid Back. [place] 0.97mm, Level II, [place] 1A 06/29/19 - No evidence of recurrence today at spinal mid back - No lymphadenopathy - Recommend regular full body skin exams - Recommend daily broad spectrum sunscreen SPF 30+ to  sun-exposed areas, reapply every 2 hours as needed.  - Call if any new or changing lesions are noted between office visits   Folliculitis Exam: few crusted papules at crown   Chronic and persistent condition with duration or expected duration over one year. Condition is improving with treatment but not currently at goal.     Folliculitis occurs due to inflammation of the superficial hair follicle (pore), resulting in acne-like lesions (pus bumps). It can be infectious (bacterial, fungal) or noninfectious (shaving, tight clothing, heat/sweat, medications).  Folliculitis can be acute or chronic and recommended treatment depends on the underlying cause of folliculitis.    Treatment Plan:  Continue clindamycin solution 1-2 times daily to affected areas ar scalp as needed. Recommend OTC Head & Shoulders shampoo 2-3x per week, massage into scalp and let sit 3-5 minutes before rinsing.   HYPERTROPHIC ACTINIC KERATOSIS (10) L forearm x 1, R knee x 1, L hand dorsum x 1, frontal scalp x 5, L preauricular x 1, R hand dorsum x 1 (10) vs Inflamed Sks.  Actinic keratoses are precancerous spots that appear secondary to cumulative UV radiation exposure/sun exposure over time. They are chronic with expected duration over 1 year. A portion of actinic keratoses will progress to squamous cell carcinoma of the skin. It is not possible to reliably predict which spots will progress to skin cancer and so treatment is recommended to prevent development of skin cancer.  Recommend daily broad spectrum sunscreen SPF 30+ to sun-exposed areas, reapply every 2 hours as needed.  Recommend staying in the shade or wearing long sleeves, sun glasses (UVA+UVB protection) and wide brim hats (4-inch brim around the entire circumference of the hat). Call for new or changing lesions. Destruction of lesion - L forearm x 1, R knee x 1, L hand dorsum x 1, frontal scalp x 5, L preauricular x 1, R hand dorsum x 1 (10)  Destruction  method: cryotherapy   Informed consent: discussed and consent obtained   Lesion destroyed using liquid nitrogen: Yes   Region frozen until ice ball extended beyond lesion: Yes   Outcome: patient tolerated procedure well with no complications   Post-procedure details: wound care instructions given   Additional details:  Prior to procedure, discussed risks of blister formation, small wound, skin dyspigmentation, or rare scar following cryotherapy. Recommend Vaseline ointment to treated areas while healing.  Return in about 6 months (around 11/05/2023) for TBSE and recheck L forearm - Hx melanoma, Hx Dysplastic Nevus, Hx SCC, Hx BCC.  IBernardine Bridegroom, CMA, am acting as scribe for Artemio Larry, MD .   Documentation: I have reviewed the above documentation for accuracy and completeness, and I agree with the above.  Artemio Larry, MD

## 2023-06-11 ENCOUNTER — Other Ambulatory Visit: Payer: Self-pay | Admitting: Registered Nurse

## 2023-06-11 DIAGNOSIS — M5416 Radiculopathy, lumbar region: Secondary | ICD-10-CM

## 2023-06-15 ENCOUNTER — Ambulatory Visit
Admission: RE | Admit: 2023-06-15 | Discharge: 2023-06-15 | Disposition: A | Discharge status: Home / Self Care | Source: Ambulatory Visit | Attending: Registered Nurse | Admitting: Registered Nurse

## 2023-06-15 DIAGNOSIS — M5416 Radiculopathy, lumbar region: Secondary | ICD-10-CM | POA: Insufficient documentation

## 2023-08-30 ENCOUNTER — Other Ambulatory Visit: Payer: Self-pay | Admitting: Urology

## 2023-09-03 ENCOUNTER — Other Ambulatory Visit: Payer: Self-pay | Admitting: Urology

## 2023-09-03 ENCOUNTER — Telehealth: Payer: Self-pay

## 2023-09-03 DIAGNOSIS — E291 Testicular hypofunction: Secondary | ICD-10-CM

## 2023-09-03 MED ORDER — TESTOSTERONE CYPIONATE 200 MG/ML IM SOLN: 100.0000 mg | INTRAMUSCULAR | 0 refills | Status: DC

## 2023-09-03 NOTE — Telephone Encounter (Signed)
 Patient needs refill of his testosterone and would like it sent to the [facility] in [place]

## 2023-09-03 NOTE — Telephone Encounter (Signed)
 Patient needs refill of his testosterone  and would like it sent to the [facility] in [place]

## 2023-09-12 ENCOUNTER — Other Ambulatory Visit: Payer: BC Managed Care – PPO

## 2023-09-13 ENCOUNTER — Other Ambulatory Visit

## 2023-09-13 DIAGNOSIS — E291 Testicular hypofunction: Secondary | ICD-10-CM

## 2023-09-14 ENCOUNTER — Ambulatory Visit: Payer: Self-pay | Admitting: Urology

## 2023-09-14 LAB — HEMOGLOBIN AND HEMATOCRIT, BLOOD
Hematocrit: 51.4 % — ABNORMAL HIGH (ref 37.5–51.0)
Hemoglobin: 17.2 g/dL (ref 13.0–17.7)

## 2023-09-14 LAB — TESTOSTERONE: Testosterone: 541 ng/dL (ref 264–916)

## 2023-11-19 IMAGING — XA DG HIP (WITH OR WITHOUT PELVIS) 1V*R*
1 series · 6 of 6 positions shown · non-contrast
Comparison: MRI 12/29/2016, CT 04/07/2020

CLINICAL DATA: Hip replacement

EXAM:
DG HIP (WITH OR WITHOUT PELVIS) 1V RIGHT

[Series 1: dg x-ray · 0.20mm/px · 6 of 6 slices shown]
[im 1/6]
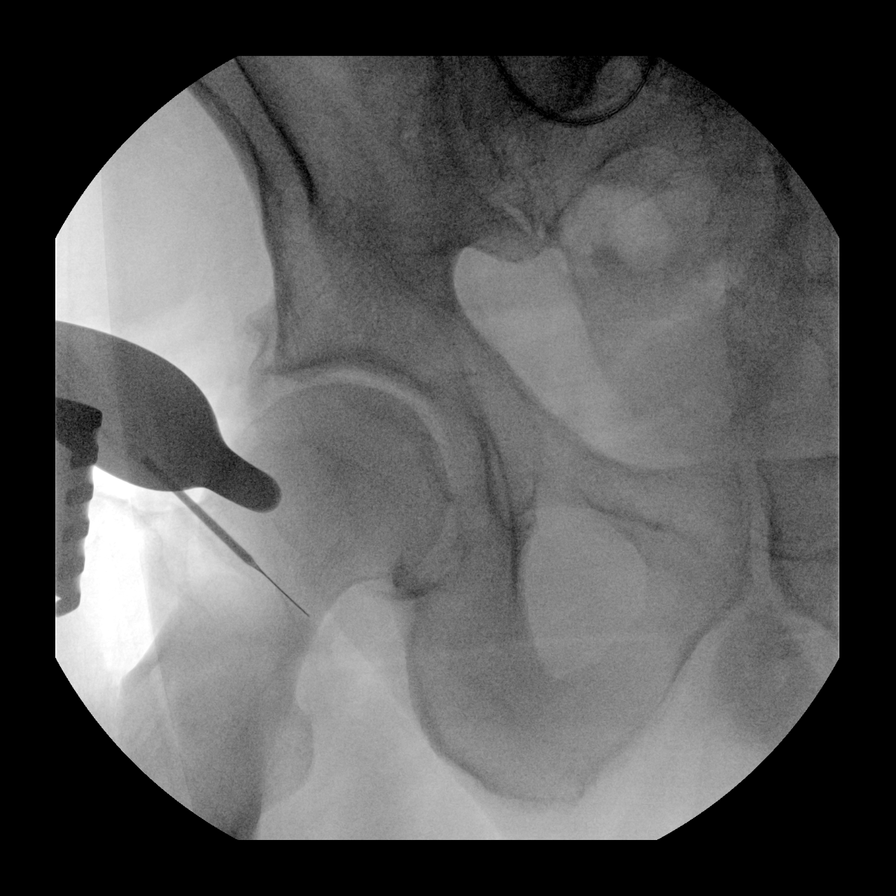
[im 2/6]
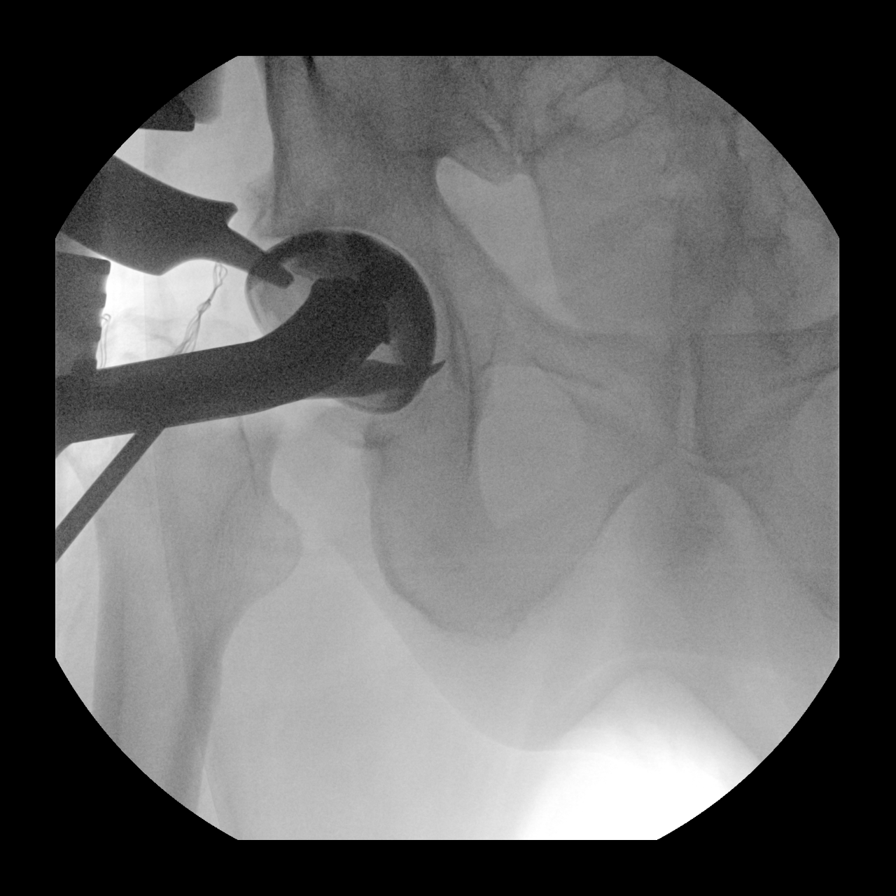
[im 3/6]
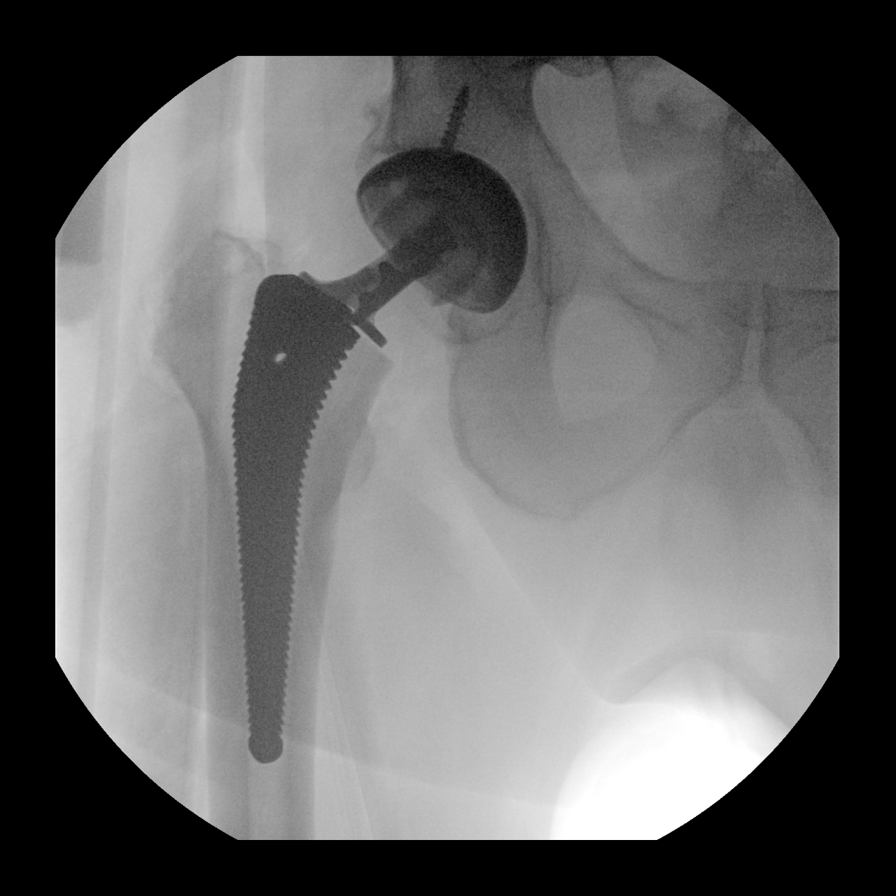
[im 4/6]
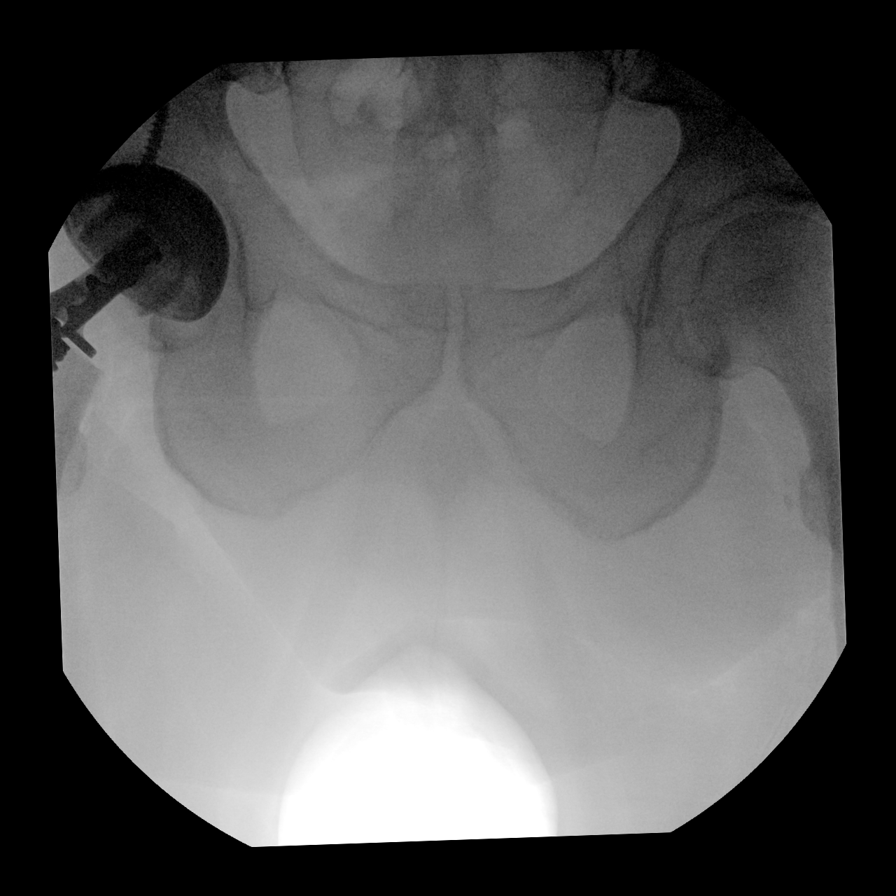
[im 5/6]
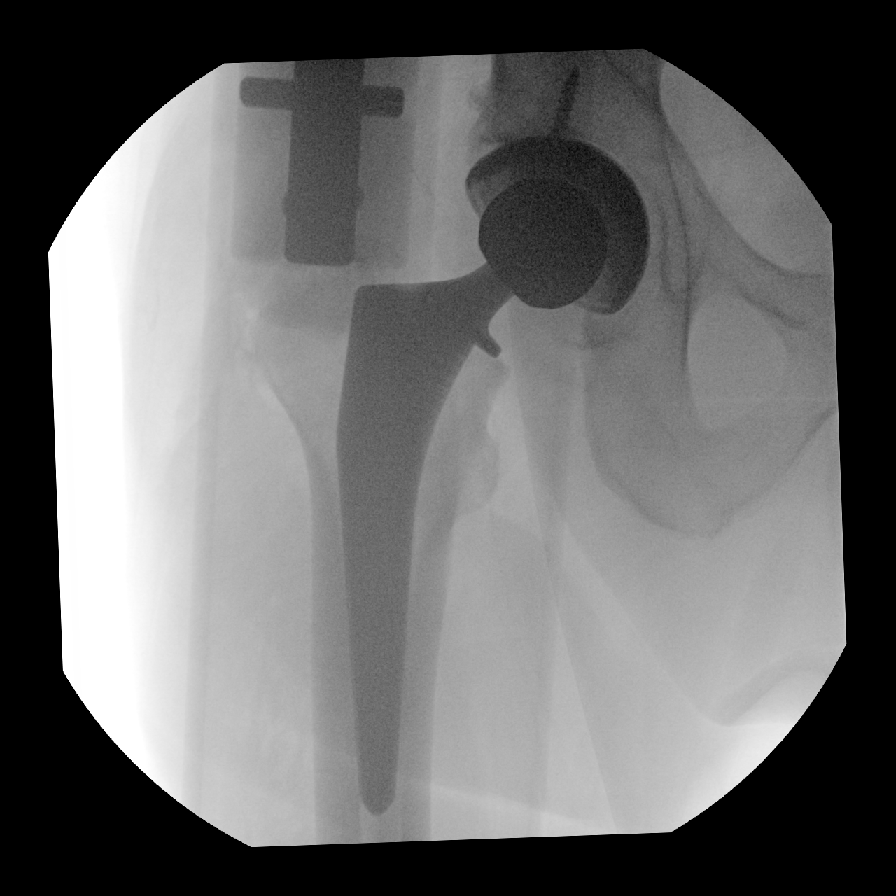
[im 6/6]
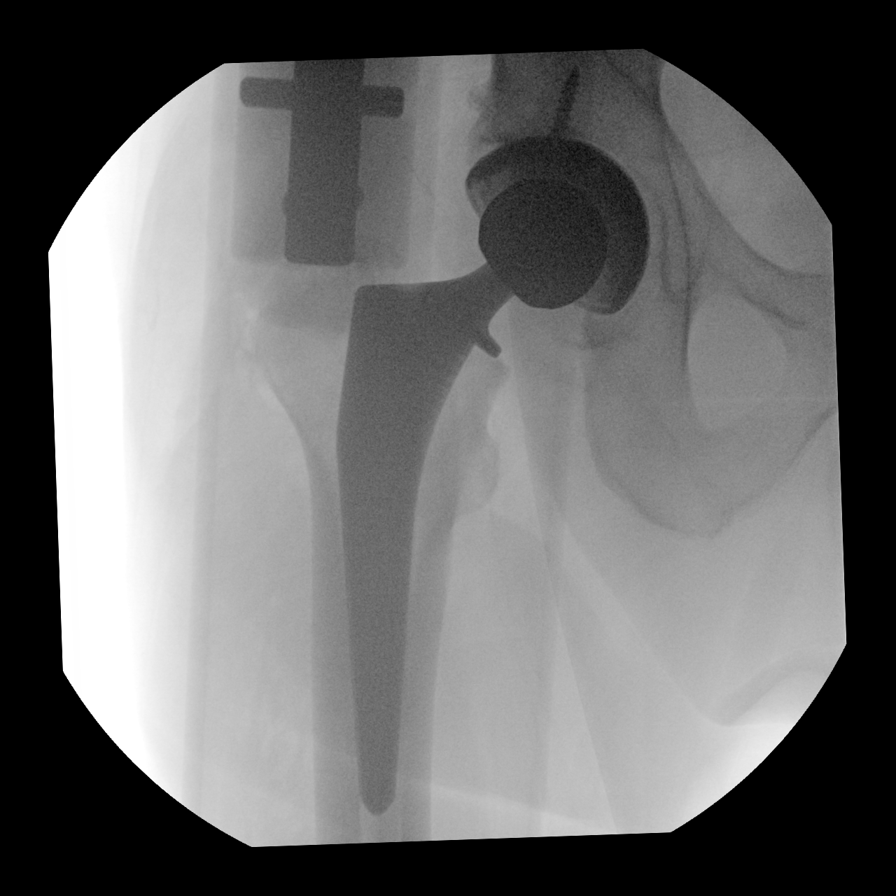

[6 of 6 positions shown; findings below may reference images not displayed]

FINDINGS: Five low resolution intraoperative spot views of the right hip.
Total fluoroscopy time was 6 seconds. Images were obtained during
operative course of right hip replacement.
IMPRESSION: Intraoperative fluoroscopic assistance provided during right hip
replacement surgery

## 2023-12-10 ENCOUNTER — Ambulatory Visit (INDEPENDENT_AMBULATORY_CARE_PROVIDER_SITE_OTHER): Admitting: Dermatology

## 2023-12-10 ENCOUNTER — Encounter: Payer: Self-pay | Admitting: Dermatology

## 2023-12-10 DIAGNOSIS — L570 Actinic keratosis: Secondary | ICD-10-CM | POA: Diagnosis not present

## 2023-12-10 DIAGNOSIS — D1801 Hemangioma of skin and subcutaneous tissue: Secondary | ICD-10-CM

## 2023-12-10 DIAGNOSIS — W908XXA Exposure to other nonionizing radiation, initial encounter: Secondary | ICD-10-CM | POA: Diagnosis not present

## 2023-12-10 DIAGNOSIS — L821 Other seborrheic keratosis: Secondary | ICD-10-CM | POA: Diagnosis not present

## 2023-12-10 DIAGNOSIS — L739 Follicular disorder, unspecified: Secondary | ICD-10-CM

## 2023-12-10 DIAGNOSIS — D229 Melanocytic nevi, unspecified: Secondary | ICD-10-CM

## 2023-12-10 DIAGNOSIS — Z86018 Personal history of other benign neoplasm: Secondary | ICD-10-CM

## 2023-12-10 DIAGNOSIS — L918 Other hypertrophic disorders of the skin: Secondary | ICD-10-CM

## 2023-12-10 DIAGNOSIS — Z7189 Other specified counseling: Secondary | ICD-10-CM

## 2023-12-10 DIAGNOSIS — L814 Other melanin hyperpigmentation: Secondary | ICD-10-CM | POA: Diagnosis not present

## 2023-12-10 DIAGNOSIS — Z85820 Personal history of malignant melanoma of skin: Secondary | ICD-10-CM

## 2023-12-10 DIAGNOSIS — D225 Melanocytic nevi of trunk: Secondary | ICD-10-CM

## 2023-12-10 DIAGNOSIS — Z85828 Personal history of other malignant neoplasm of skin: Secondary | ICD-10-CM

## 2023-12-10 DIAGNOSIS — L820 Inflamed seborrheic keratosis: Secondary | ICD-10-CM

## 2023-12-10 DIAGNOSIS — L578 Other skin changes due to chronic exposure to nonionizing radiation: Secondary | ICD-10-CM | POA: Diagnosis not present

## 2023-12-10 DIAGNOSIS — Z86007 Personal history of in-situ neoplasm of skin: Secondary | ICD-10-CM

## 2023-12-10 MED ORDER — CLINDAMYCIN PHOSPHATE 1 % EX SOLN: CUTANEOUS | 5 refills | Status: AC

## 2023-12-10 NOTE — Patient Instructions (Addendum)
 Cryotherapy Aftercare  Wash gently with soap and water  everyday.   Apply Vaseline Jelly daily until healed.     Photodynamic Therapy- Blue or Red Light Therapy  Actinic keratoses are the dry, red scaly spots on the skin caused by sun damage. A portion of these spots can turn into skin cancer with time, and treating them can help prevent development of skin cancer.   Treatment of these spots requires removal of the defective skin cells. There are various ways to remove actinic keratoses, including freezing with liquid nitrogen, treatment with creams, or treatment with a blue light procedure in the office.   Photodynamic Therapy (PDT), also known as blue or red light therapy is an in office procedure used to treat actinic keratoses. It works by targeting precancerous cells. After treatment, these cells peel off and are replaced by healthy ones.   For your phototherapy appointment, you will have two appointments on the day of your treatment. The first appointment will be to apply a cream to the treatment area. You will leave this cream on for 1-2 hours depending on the area being treated. The second appointment will be to shine a blue or red light on the area for 16-20 minutes to kill off the precancer cells. It is common to experience a burning sensation during the treatment.  After your treatment, it will be important to keep the treated areas of skin out of the sun completely for 48-72 hours (2-3 days) to prevent having a reaction.   Common side effects include: - Burning or stinging, which may be severe and can last up to 24-72 hours after your treatment - Scaling and crusting which may last up to 2 weeks - Redness, swelling and/or peeling which can last up to 4 weeks  To Care for Your Skin After PDT/Blue/Red Light Therapy: - Wash with soap, water  and shampoo as normal. - If needed, you can use cold compresses (e.g. ice packs) for comfort - If okay with your primary care doctor, you  may use analgesics such as acetaminophen  (tylenol ) every 4-6 hours, not to exceed recommended dose - You may apply Cerave Healing Ointment, Vaseline or Aquaphor as needed - If you have a lot of swelling you may take a Benadryl to help with this (this may cause drowsiness), not to exceed recommended dose. This may increase the risk of falls in people over 65 and may slow reaction time while driving, so it is not recommended to take before driving or operating machinery. - Sun Precautions - Wear a wide brim hat for the next week if outside  - Wear a sunblock with zinc or titanium dioxide at least SPF 50 daily  If you have any questions or concerns, please call the office and ask to speak with a nurse.       Recommend daily broad spectrum sunscreen SPF 30+ to sun-exposed areas, reapply every 2 hours as needed. Call for new or changing lesions.  Staying in the shade or wearing long sleeves, sun glasses (UVA+UVB protection) and wide brim hats (4-inch brim around the entire circumference of the hat) are also recommended for sun protection.      Melanoma ABCDEs  Melanoma is the most dangerous type of skin cancer, and is the leading cause of death from skin disease.  You are more likely to develop melanoma if you: Have light-colored skin, light-colored eyes, or red or blond hair Spend a lot of time in the sun Tan regularly, either outdoors or in a  tanning bed Have had blistering sunburns, especially during childhood Have a close family member who has had a melanoma Have atypical moles or large birthmarks  Early detection of melanoma is key since treatment is typically straightforward and cure rates are extremely high if we catch it early.   The first sign of melanoma is often a change in a mole or a new dark spot.  The ABCDE system is a way of remembering the signs of melanoma.  A for asymmetry:  The two halves do not match. B for border:  The edges of the growth are irregular. C for color:   A mixture of colors are present instead of an even brown color. D for diameter:  Melanomas are usually (but not always) greater than 6mm - the size of a pencil eraser. E for evolution:  The spot keeps changing in size, shape, and color.  Please check your skin once per month between visits. You can use a small mirror in front and a large mirror behind you to keep an eye on the back side or your body.   If you see any new or changing lesions before your next follow-up, please call to schedule a visit.  Please continue daily skin protection including broad spectrum sunscreen SPF 30+ to sun-exposed areas, reapplying every 2 hours as needed when you're outdoors.   Staying in the shade or wearing long sleeves, sun glasses (UVA+UVB protection) and wide brim hats (4-inch brim around the entire circumference of the hat) are also recommended for sun protection.      Due to recent changes in healthcare laws, you may see results of your pathology and/or laboratory studies on MyChart before the doctors have had a chance to review them. We understand that in some cases there may be results that are confusing or concerning to you. Please understand that not all results are received at the same time and often the doctors may need to interpret multiple results in order to provide you with the best plan of care or course of treatment. Therefore, we ask that you please give us  2 business days to thoroughly review all your results before contacting the office for clarification. Should we see a critical lab result, you will be contacted sooner.   If You Need Anything After Your Visit  If you have any questions or concerns for your doctor, please call our main line at [phone] and press option 4 to reach your doctor's medical assistant. If no one answers, please leave a voicemail as directed and we will return your call as soon as possible. Messages left after 4 pm will be answered the following business day.    You may also send us  a message via MyChart. We typically respond to MyChart messages within 1-2 business days.  For prescription refills, please ask your pharmacy to contact our office. Our fax number is [phone].  If you have an urgent issue when the clinic is closed that cannot wait until the next business day, you can page your doctor at the number below.    Please note that while we do our best to be available for urgent issues outside of office hours, we are not available 24/7.   If you have an urgent issue and are unable to reach us , you may choose to seek medical care at your doctor's office, retail clinic, urgent care center, or emergency room.  If you have a medical emergency, please immediately call 911 or go to the emergency department.  Pager Numbers  - Dr. Hester: [phone]  - Dr. Jackquline: [phone]  - Dr. Claudene: [phone]   - Dr. Raymund: [phone]  In the event of inclement weather, please call our main line at [phone] for an update on the status of any delays or closures.  Dermatology Medication Tips: Please keep the boxes that topical medications come in in order to help keep track of the instructions about where and how to use these. Pharmacies typically print the medication instructions only on the boxes and not directly on the medication tubes.   If your medication is too expensive, please contact our office at [phone] option 4 or send us  a message through MyChart.   We are unable to tell what your co-pay for medications will be in advance as this is different depending on your insurance coverage. However, we may be able to find a substitute medication at lower cost or fill out paperwork to get insurance to cover a needed medication.   If a prior authorization is required to get your medication covered by your insurance company, please allow us  1-2 business days to complete this process.  Drug prices often vary depending on where the  prescription is filled and some pharmacies may offer cheaper prices.  The website www.goodrx.com contains coupons for medications through different pharmacies. The prices here do not account for what the cost may be with help from insurance (it may be cheaper with your insurance), but the website can give you the price if you did not use any insurance.  - You can print the associated coupon and take it with your prescription to the pharmacy.  - You may also stop by our office during regular business hours and pick up a GoodRx coupon card.  - If you need your prescription sent electronically to a different pharmacy, notify our office through [facility] or by phone at [phone] option 4.     Si Usted Necesita Algo Despus de Su Visita  Tambin puede enviarnos un mensaje a travs de Clinical cytogeneticist. Por lo general respondemos a los mensajes de MyChart en el transcurso de 1 a 2 das hbiles.  Para renovar recetas, por favor pida a su farmacia que se ponga en contacto con nuestra oficina. Randi lakes de fax es [place] [phone].  Si tiene un asunto urgente cuando la clnica est cerrada y que no puede esperar hasta el siguiente da hbil, puede llamar/localizar a su doctor(a) al nmero que aparece a continuacin.   Por favor, tenga en cuenta que aunque hacemos todo lo posible para estar disponibles para asuntos urgentes fuera del horario de [place], no estamos disponibles las 24 horas del da, los 7 [address]  [address] de la [place].   Si tiene un problema urgente y no puede comunicarse con nosotros, puede optar por buscar atencin mdica  en el consultorio de su doctor(a), en una clnica privada, en un centro de atencin urgente o en una sala de emergencias.  Si tiene Engineer, drilling, por favor llame inmediatamente al 911 o vaya a la sala de emergencias.  Nmeros de bper  - Dr. Hester: [phone]  - Dra. Jackquline: [phone]  - Dr. Claudene: [phone]  - Dra. Kitts: [phone]  En  caso de inclemencias del [place], por favor llame a nuestra lnea principal al [phone] para una actualizacin sobre el estado de cualquier retraso o cierre.  Consejos para la medicacin en dermatologa: Por favor, guarde las cajas en las que vienen los medicamentos de uso tpico para ayudarle a seguir  las instrucciones sobre dnde y cmo usarlos. Las farmacias generalmente imprimen las instrucciones del medicamento slo en las cajas y no directamente en los tubos del [place].   Si su medicamento es muy caro, por favor, pngase en contacto con landry rieger llamando al [phone] y presione la opcin 4 o envenos un mensaje a travs de Clinical cytogeneticist.   No podemos decirle cul ser su copago por los medicamentos por adelantado ya que esto es diferente dependiendo de la cobertura de su seguro. Sin embargo, es posible que podamos encontrar un medicamento sustituto a Audiological scientist un formulario para que el seguro cubra el medicamento que se considera necesario.   Si se requiere una autorizacin previa para que su compaa de seguros malta su medicamento, por favor permtanos de 1 a 2 das hbiles para completar este proceso.  Los precios de los medicamentos varan con frecuencia dependiendo del Environmental consultant de dnde se surte la receta y alguna farmacias pueden ofrecer precios ms baratos.  El sitio web www.goodrx.com tiene cupones para medicamentos de Health and safety inspector. Los precios aqu no tienen en cuenta lo que podra costar con la ayuda del seguro (puede ser ms barato con su seguro), pero el sitio web puede darle el precio si no utiliz Tourist information centre manager.  - Puede imprimir el cupn correspondiente y llevarlo con su receta a la farmacia.  - Tambin puede pasar por nuestra oficina durante el horario de atencin regular y Education officer, museum una tarjeta de cupones de GoodRx.  - Si necesita que su receta se enve electrnicamente a una farmacia diferente, informe a nuestra oficina a travs de MyChart de [facility] o por telfono llamando al [phone] y presione la opcin 4.

## 2023-12-10 NOTE — Progress Notes (Signed)
 Follow-Up Visit   Subjective  Mike Patel is a 63 y.o. male who presents for the following: Skin Cancer Screening and Full Body Skin Exam  The patient presents for Total-Body Skin Exam (TBSE) for skin cancer screening and mole check. The patient has spots, moles and lesions to be evaluated, some may be new or changing. He has a new spot on his forehead at hairline. History of BCC, Dysplastic Nevi, and Melanoma.    The following portions of the chart were reviewed this encounter and updated as appropriate: medications, allergies, medical history  Review of Systems:  No other skin or systemic complaints except as noted in HPI or Assessment and Plan.  Objective  Well appearing patient in no apparent distress; mood and affect are within normal limits.  A full examination was performed including scalp, head, eyes, ears, nose, lips, neck, chest, axillae, abdomen, back, buttocks, bilateral upper extremities, bilateral lower extremities, hands, feet, fingers, toes, fingernails, and toenails. All findings within normal limits unless otherwise noted below.   Patient deferred removal of socks/shoes.   Relevant physical exam findings are noted in the Assessment and Plan.  Scalp x10 (10) Erythematous thin papules/macules with gritty scale.   Assessment & Plan   ACTINIC DAMAGE WITH PRECANCEROUS ACTINIC KERATOSES Counseling for Topical Chemotherapy Management: Patient exhibits: - Severe, confluent actinic changes with pre-cancerous actinic keratoses that is secondary to cumulative UV radiation exposure over time - Condition that is severe; chronic, not at goal. - diffuse scaly erythematous macules and papules with underlying dyspigmentation - Discussed Prescription Field Treatment topical Chemotherapy for Severe, Chronic Confluent Actinic Changes with Pre-Cancerous Actinic Keratoses Field treatment involves treatment of an entire area of skin that has confluent Actinic Changes (Sun/  Ultraviolet light damage) and PreCancerous Actinic Keratoses by method of PhotoDynamic Therapy (PDT) and/or prescription Topical Chemotherapy agents such as 5-fluorouracil, 5-fluorouracil/calcipotriene, and/or imiquimod.  The purpose is to decrease the number of clinically evident and subclinical PreCancerous lesions to prevent progression to development of skin cancer by chemically destroying early precancer changes that may or may not be visible.  It has been shown to reduce the risk of developing skin cancer in the treated area. As a result of treatment, redness, scaling, crusting, and open sores may occur during treatment course. One or more than one of these methods may be used and may have to be used several times to control, suppress and eliminate the PreCancerous changes. Discussed treatment course, expected reaction, and possible side effects. - Recommend daily broad spectrum sunscreen SPF 30+ to sun-exposed areas, reapply every 2 hours as needed.  - Staying in the shade or wearing long sleeves, sun glasses (UVA+UVB protection) and wide brim hats (4-inch brim around the entire circumference of the hat) are also recommended. - Call for new or changing lesions. - Will schedule red light photodynamic therapy to the scalp with debridement.   LENTIGINES, SEBORRHEIC KERATOSES, HEMANGIOMAS - Benign normal skin lesions - Benign-appearing - Call for any changes  MELANOCYTIC NEVI - Tan-brown and/or pink-flesh-colored symmetric macules and papules - L spinal upper back 4.41mm brown macule, lighter central - R paraspinal mid upper back 3.40mm med brown two toned macule - R spinal mid back 6.26mm brown macule with darker edge - Benign appearing on exam today - Observation - Call clinic for new or changing moles - Recommend daily use of broad spectrum spf 30+ sunscreen to sun-exposed areas.   INFLAMED SEBORRHEIC KERATOSIS - 10 mm waxy pink tan macule with scale at L  upper forearm without features  suspicious for malignancy on dermoscopy.Stable compared to photo from 05/06/2023. - Benign-appearing. Recheck on follow-up. - Discussed benign etiology and prognosis. - Observe - Call for any changes  History of Basal Cell Carcinoma of the Skin - No evidence of recurrence today- R lower lip, 2021 - Recommend regular full body skin exams - Recommend daily broad spectrum sunscreen SPF 30+ to sun-exposed areas, reapply every 2 hours as needed.  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  - Call if any new or changing lesions are noted between office visits  History of Dysplastic Nevi - No evidence of recurrence today- L mid back, R spinal mid back, R upper arm - Recommend regular full body skin exams - Recommend daily broad spectrum sunscreen SPF 30+ to sun-exposed areas, reapply every 2 hours as needed.  - Call if any new or changing lesions are noted between office visits    HISTORY OF MELANOMA  Spinal Mid Back. Breslows 0.52mm, Level II, [place] 1A 06/29/19 - No evidence of recurrence today at spinal mid back - No lymphadenopathy - Recommend regular full body skin exams - Recommend daily broad spectrum sunscreen SPF 30+ to sun-exposed areas, reapply every 2 hours as needed.  - Call if any new or changing lesions are noted between office visits   Acrochordons (Skin Tags) - Fleshy, skin-colored pedunculated papules at neck - Benign appearing.  - Observe. - If desired, they can be removed with an in office procedure that is not covered by insurance. - Please call the clinic if you notice any new or changing lesions.   Folliculitis Exam: few crusted papules at occipital scalp   Chronic and  persistent condition with duration or expected duration over one year. Condition is improving with treatment but not currently at goal.     Folliculitis occurs due to inflammation of the superficial hair follicle (pore), resulting in acne-like lesions (pus bumps). It can be infectious (bacterial, fungal) or noninfectious (shaving, tight clothing, heat/sweat, medications).  Folliculitis can be acute or chronic and recommended treatment depends on the underlying cause of folliculitis.    Treatment Plan:  Continue clindamycin  solution 1-2 times daily to affected areas ar scalp as needed. Recommend OTC Head & Shoulders shampoo 2-3x per week, massage into scalp and let sit 3-5 minutes before rinsing.   AK (ACTINIC KERATOSIS) (10) Scalp x10 (10) Actinic keratoses are precancerous spots that appear secondary to cumulative UV radiation exposure/sun exposure over time. They are chronic with expected duration over 1 year. A portion of actinic keratoses will progress to squamous cell carcinoma of the skin. It is not possible to reliably predict which spots will progress to skin cancer and so treatment is recommended to prevent development of skin cancer.  Recommend daily broad spectrum sunscreen SPF 30+ to sun-exposed areas, reapply every 2 hours as needed.  Recommend staying in the shade or wearing long sleeves, sun glasses (UVA+UVB protection) and wide brim hats (4-inch brim around the entire circumference of the hat). Call for new or changing lesions. Destruction of lesion - Scalp x10 (10)  Destruction method: cryotherapy   Informed consent: discussed and consent obtained   Lesion destroyed using liquid nitrogen:  Yes   Region frozen until ice ball extended beyond lesion: Yes   Outcome: patient tolerated procedure well with no complications   Post-procedure details: wound care instructions given   Additional details:  Prior to procedure, discussed risks of blister formation, small wound, skin  dyspigmentation, or rare scar following cryotherapy. Recommend Vaseline ointment to treated areas while healing.   Return in about 6 months (around 06/08/2024) for TBSE, HxMM, HxBCC, HxSCC, PDT Next Available (scalp wtih debridement).  I, Jill Parcell, CMA, am acting as scribe for Rexene Rattler, MD.    Documentation: I have reviewed the above documentation for accuracy and completeness, and I agree with the above.  Rexene Rattler, MD

## 2023-12-16 ENCOUNTER — Other Ambulatory Visit: Payer: Self-pay | Admitting: Urology

## 2023-12-16 DIAGNOSIS — E291 Testicular hypofunction: Secondary | ICD-10-CM

## 2024-01-06 ENCOUNTER — Telehealth: Payer: Self-pay

## 2024-01-06 NOTE — Telephone Encounter (Signed)
 Spoke with patient in regards to refilling Testosterone  prescription. Advised that prescription was sent to the pharmacy.  Patient states that he will contact pharmacy to see if they need additional information for prescription refill.  Andrea Kirks LPN

## 2024-01-14 ENCOUNTER — Telehealth: Payer: Self-pay

## 2024-01-14 NOTE — Telephone Encounter (Signed)
 Contacted [facility]  to initiate a PA for Testosterone -   [phone] benefit manager  Given Website- [email or web address] to complete PA and upload records electronically.  See Media Tab.   PA- EOC ID- [id number]  Pts RX ID- [id number]  Pt aware. Will f/u next week.

## 2024-01-30 NOTE — Telephone Encounter (Signed)
 Pt p/u T on  01/18/24.   Last dose 01/25/24.

## 2024-02-14 ENCOUNTER — Encounter: Payer: Self-pay | Admitting: Urology

## 2024-02-25 ENCOUNTER — Other Ambulatory Visit: Payer: Self-pay | Admitting: Urology

## 2024-03-09 ENCOUNTER — Other Ambulatory Visit

## 2024-03-11 ENCOUNTER — Other Ambulatory Visit: Payer: BC Managed Care – PPO

## 2024-03-13 ENCOUNTER — Ambulatory Visit: Payer: BC Managed Care – PPO | Admitting: Urology

## 2024-03-17 ENCOUNTER — Ambulatory Visit: Admitting: Urology

## 2024-06-01 ENCOUNTER — Ambulatory Visit: Admitting: Dermatology
